# Patient Record
Sex: Female | Born: 1995
Health system: Southern US, Community
[De-identification: ages and names within clinical notes are randomized; demographics above are authoritative.]

## PROBLEM LIST (undated history)

## (undated) DIAGNOSIS — R109 Unspecified abdominal pain: Secondary | ICD-10-CM

## (undated) DIAGNOSIS — R112 Nausea with vomiting, unspecified: Secondary | ICD-10-CM

## (undated) HISTORY — PX: SHOULDER ARTHROSCOPY: SHX128

## (undated) HISTORY — PX: SHOULDER LATERJET: SHX6528

## (undated) HISTORY — PX: WISDOM TOOTH EXTRACTION: SHX21

---
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
## 2017-01-04 DIAGNOSIS — L040 Acute lymphadenitis of face, head and neck: Secondary | ICD-10-CM | POA: Diagnosis not present

## 2017-01-08 ENCOUNTER — Ambulatory Visit: Payer: BLUE CROSS/BLUE SHIELD | Attending: Orthopedic Surgery | Admitting: Physical Therapy

## 2017-01-08 ENCOUNTER — Encounter: Payer: Self-pay | Admitting: Physical Therapy

## 2017-01-08 DIAGNOSIS — M25512 Pain in left shoulder: Secondary | ICD-10-CM | POA: Diagnosis not present

## 2017-01-08 DIAGNOSIS — M25612 Stiffness of left shoulder, not elsewhere classified: Secondary | ICD-10-CM

## 2017-01-08 DIAGNOSIS — M6281 Muscle weakness (generalized): Secondary | ICD-10-CM | POA: Diagnosis not present

## 2017-01-08 NOTE — Therapy (Signed)
[place] [facility] [address] [place], [place], [address] Phone: [phone]   Fax:  [phone]  Physical Therapy Evaluation  Patient Details  Name: Sara Gomez MRN: [id number] Date of Birth: [birth year] Referring Provider: Dr. Roney Mans  Encounter Date: 01/08/2017      PT End of Session - 01/08/17 1015    Visit Number 1   Number of Visits 16   Date for PT Re-Evaluation 03/05/17   PT Start Time 0855   PT Stop Time 1001   PT Time Calculation (min) 66 min   Activity Tolerance Patient tolerated treatment well;No increased pain   Behavior During Therapy [facility] for tasks assessed/performed      History reviewed. No pertinent past medical history.  History reviewed. No pertinent surgical history.  There were no vitals filed for this visit.       Subjective Assessment - 01/08/17 0955    Subjective Pt. reports undergoing 2nd L shoulder surgery in a year on 11/28/16 (pt. will be at 6 weeks s/p tomorrow).  Pt. reports 1/10 L shoulder pain at rest and >5/10 at worst.  Pt. reports compliance with use of sling and not done any ROM in L shoulder at this time.  Pt. is hoping to get back to swimming/ running/ school work.     Limitations Lifting;Standing;Writing;House hold activities   Patient Stated Goals Increase L shoulder ROM/ strength to improve pain-free mobility.     Currently in Pain? Yes   Pain Score 1    Pain Location Shoulder   Pain Orientation Left   Pain Type Surgical pain   Pain Onset More than a month ago   Pain Frequency Intermittent            [facility] PT Assessment - 01/08/17 0001      Assessment   Medical Diagnosis s/p L shoulder labral repair   Referring Provider Dr. Roney Mans   Onset Date/Surgical Date 11/28/16   Hand Dominance Right   Prior Therapy yes at [facility] after initial surgery 1/17     Precautions   Precautions --  see MD protocol      See handouts for AA/PROM of L shoulder.  Issued shoulder  pulleys.         PT Education - 01/08/17 0935    Education provided Yes   Education Details See HEP/ issued pulley ex.    Person(s) Educated Patient   Methods Explanation;Demonstration;Handout   Comprehension Verbalized understanding;Returned demonstration;Tactile cues required             PT Long Term Goals - 01/08/17 1038      PT LONG TERM GOAL #1   Title Pt. I with HEP to increase L shoulder AROM to WNL as compared to R shoulder to improve pain-free mobility.     Baseline L shoulder flexion 107 deg., abd. 45 deg. and ER 0 deg. AAROM   Time 8   Period Weeks   Status New     PT LONG TERM GOAL #2   Title Pt. will increase L deltoid/ biceps/ triceps strength to grossly 4+/5 MMT to improve return to household tasks/ running.    Baseline No MMT at this time.  Will begin isometrics at 8 weeks.    Time 8   Period Weeks   Status New     PT LONG TERM GOAL #3   Title Pt. will decrease QuickDASH to <20% to improve pain-free moblity of L shoulder.     Baseline QuickDASH:  59.1% on 2/26   Time 8   Period Weeks   Status New     PT LONG TERM GOAL #4   Title Pt. able to return to gym based ex./ running with no L shoulder restrictions (Phase III) to improve return to PLOF.     Baseline No gym based ex. at this time.  Pt. not driving at this time.    Time 8   Period Weeks   Status New           Plan - 01/08/17 1017    Clinical Impression Statement Pt. is a pleasant 21 y/o female s/p L labral repair on 11/28/16.  Pt. reports that this is the 2nd L shoulder in a year due to a cheering injury and fall on stairs.  Pt. reports 1/10 L shoulder pain currently at rest in sling and >5/10 at worst over past week.  Decrease R shoulder PROM:  flexion (107 deg.), abd. (45 deg.)- per MD protocol, ER (neutral position)- pain limited.  No L shoulder tenderness and instructed pt. on scar massage.  R grip strength: 58.8# and L 37.7#.   QuickDASH: 59.1%.  Pt. instructed in progression of MD  protocol and importance of following ROM restrictions to improve L shoulder ROM/ stability to promote return to goals.     Rehab Potential Excellent   PT Frequency 2x / week   PT Duration 8 weeks   PT Treatment/Interventions ADLs/Self Care Home Management;Aquatic Therapy;Cryotherapy;Electrical Stimulation;Therapeutic exercise;Patient/family education;Neuromuscular re-education;Manual techniques;Scar mobilization;Passive range of motion   PT Next Visit Plan Progress L shoulder AA/PROM.  Start isometrics at 8 weeks s/p surgery (01/23/17).     PT Home Exercise Plan see handouts   Consulted and Agree with Plan of Care Patient      Patient will benefit from skilled therapeutic intervention in order to improve the following deficits and impairments:  Pain, Decreased mobility, Decreased scar mobility, Hypomobility, Decreased strength, Decreased range of motion, Decreased endurance, Decreased activity tolerance, Impaired flexibility  Visit Diagnosis: Acute pain of left shoulder  Shoulder joint stiffness, left  Muscle weakness (generalized)     Problem List There are no active problems to display for this patient.  Cammie McgeeMichael C Juell Radney, PT, DPT # [phone] 01/08/2017, 10:43 AM  [facility] [address] [place], [place], [address] Phone: [phone]   Fax:  [phone]  Name: Sara Gomez MRN: [id number] Date of Birth: 02/24/1996

## 2017-01-10 ENCOUNTER — Ambulatory Visit: Payer: BLUE CROSS/BLUE SHIELD | Admitting: Physical Therapy

## 2017-01-10 DIAGNOSIS — M25512 Pain in left shoulder: Secondary | ICD-10-CM | POA: Diagnosis not present

## 2017-01-10 DIAGNOSIS — M25612 Stiffness of left shoulder, not elsewhere classified: Secondary | ICD-10-CM

## 2017-01-10 DIAGNOSIS — M6281 Muscle weakness (generalized): Secondary | ICD-10-CM

## 2017-01-10 NOTE — Therapy (Signed)
[place] [facility] [address] [place], [place], [address] Phone: [phone]   Fax:  [phone]  Physical Therapy Treatment  Patient Details  Name: Sara GullyChloe A Mccammon MRN: [id number] Date of Birth: 02/15/1996 Referring Provider: Dr. Roney Mansreighton  Encounter Date: 01/10/2017      PT End of Session - 01/10/17 1137    Visit Number 2   Number of Visits 16   Date for PT Re-Evaluation 03/05/17   PT Start Time 0953   PT Stop Time 1040   PT Time Calculation (min) 47 min   Activity Tolerance Patient tolerated treatment well;No increased pain   Behavior [facility] Therapy [facility] for tasks assessed/performed      No past medical history on file.  No past surgical history on file.  There were no vitals filed for this visit.      Subjective Assessment - 01/10/17 1135    Subjective Pt. reports being really busy with errands/ shopping/ household cleaning yesterday resulting in L shoulder soreness.  Pt. wore sling for L shoulder support during tasks but reports increase discomfort today.  Pt. reports 2-3/10 L shoulder pain.  Pt. reports compliance with HEP.     Limitations Lifting;Standing;Writing;House hold activities   Patient Stated Goals Increase L shoulder ROM/ strength to improve pain-free mobility.     Currently in Pain? Yes   Pain Score 3    Pain Location Shoulder   Pain Orientation Left   Pain Descriptors / Indicators Aching;Sore   Pain Type Surgical pain   Pain Onset More than a month ago   Pain Frequency Intermittent       OBJECTIVE:  There.ex.: Reviewed HEP.  Seated pulley L shoulder AAROM flexion and abd. 10x2 each with cuing to increase static holds.  Supine wand B shoulder AAROM: press-ups/ flexion/ abd. 10x3 each. Manual tx.:  Seated L shoulder/UT STM during pulley ex.  Supine L shoulder AA/PROM 10x3 in flexion/ abd./ ER to MD required ROM.  Pt. Limited to 115 deg. L shoulder flexion/ 45 deg. Abd./ 0 deg. ER.  Discussed scar massage and  benefits of icing L shoulder on a consistent basis.      Pt response for medical necessity: benefits from L shoulder ROM ex. Progression to improve sh. Joint mobility and prepare of L UE isometrics.  No increase c/o pain but moderate joint stiffness/ hypomobility noted.         PT Long Term Goals - 01/08/17 1038      PT LONG TERM GOAL #1   Title Pt. I with HEP to increase L shoulder AROM to WNL as compared to R shoulder to improve pain-free mobility.     Baseline L shoulder flexion 107 deg., abd. 45 deg. and ER 0 deg. AAROM   Time 8   Period Weeks   Status New     PT LONG TERM GOAL #2   Title Pt. will increase L deltoid/ biceps/ triceps strength to grossly 4+/5 MMT to improve return to household tasks/ running.    Baseline No MMT at this time.  Will begin isometrics at 8 weeks.    Time 8   Period Weeks   Status New     PT LONG TERM GOAL #3   Title Pt. will decrease QuickDASH to <20% to improve pain-free moblity of L shoulder.     Baseline QuickDASH: 59.1% on 2/26   Time 8   Period Weeks   Status New     PT LONG TERM GOAL #  4   Title Pt. able to return to gym based ex./ running with no L shoulder restrictions (Phase III) to improve return to PLOF.     Baseline No gym based ex. at this time.  Pt. not driving at this time.    Time 8   Period Weeks   Status New             Plan - 01/10/17 1137    Clinical Impression Statement Pt. presents with L shoulder joint stiffness with flexion/ ER.  Pt. benefits from pulley ex./ warm-up STM to L UT and deltoid region to improve L shoulder AA/PROM in supine position.  PT discussed incision healing/ scar massage with [employer].  L UT muscle tightness/ minimal tenderness with palpation along muscle to shoulder region.  Pt. able to tolerate L shoulder ROM ex. within a pain tolerable range.     Rehab Potential Excellent   PT Frequency 2x / week   PT Duration 8 weeks   PT Treatment/Interventions ADLs/Self Care Home Management;Aquatic  Therapy;Cryotherapy;Electrical Stimulation;Therapeutic exercise;Patient/family education;Neuromuscular re-education;Manual techniques;Scar mobilization;Passive range of motion   PT Next Visit Plan Progress L shoulder AA/PROM.  Start isometrics next week (check MD protocol).     PT Home Exercise Plan see handouts   Consulted and Agree with Plan of Care Patient      Patient will benefit from skilled therapeutic intervention in order to improve the following deficits and impairments:  Pain, Decreased mobility, Decreased scar mobility, Hypomobility, Decreased strength, Decreased range of motion, Decreased endurance, Decreased activity tolerance, Impaired flexibility  Visit Diagnosis: Acute pain of left shoulder  Shoulder joint stiffness, left  Muscle weakness (generalized)     Problem List There are no active problems to display for this patient.  Sara Gomez, PT, DPT # [phone] 01/10/2017, 11:43 AM  [facility] [address] [place], [place], [address] Phone: [phone]   Fax:  [phone]  Name: Sara Gomez MRN: [id number] Date of Birth: 03-30-1996

## 2017-01-15 ENCOUNTER — Ambulatory Visit: Payer: BLUE CROSS/BLUE SHIELD | Attending: Orthopedic Surgery | Admitting: Physical Therapy

## 2017-01-15 DIAGNOSIS — M25612 Stiffness of left shoulder, not elsewhere classified: Secondary | ICD-10-CM | POA: Diagnosis not present

## 2017-01-15 DIAGNOSIS — M6281 Muscle weakness (generalized): Secondary | ICD-10-CM | POA: Diagnosis not present

## 2017-01-15 DIAGNOSIS — M25512 Pain in left shoulder: Secondary | ICD-10-CM | POA: Diagnosis not present

## 2017-01-15 NOTE — Therapy (Signed)
[place] [facility] [address] [place], [place], [address] Phone: [phone]   Fax:  [phone]  Physical Therapy Treatment  Patient Details  Name: Raizy Auzenne Pepper MRN: [id number] Date of Birth: 06-08-1996 Referring Provider: Dr. Roney Mans  Encounter Date: 01/15/2017      PT End of Session - 01/15/17 0904    Visit Number 3   Number of Visits 16   Date for PT Re-Evaluation 03/05/17   PT Start Time 0857   PT Stop Time 0946   PT Time Calculation (min) 49 min   Activity Tolerance Patient tolerated treatment well;No increased pain   Behavior During Therapy [facility] for tasks assessed/performed      No past medical history on file.  No past surgical history on file.  There were no vitals filed for this visit.      Subjective Assessment - 01/15/17 0900    Subjective Patient reports nerve pain yesterday 9/10 that has decreased to 4/10 pain. Pt. iced and took aleve to relieve pain. Pt. reports compliance with HEP with exception of yesterday.    Limitations Lifting;Standing;Writing;House hold activities   Patient Stated Goals Increase L shoulder ROM/ strength to improve pain-free mobility.     Currently in Pain? Yes   Pain Score 4    Pain Location Shoulder   Pain Orientation Left   Pain Descriptors / Indicators Aching;Sore   Pain Onset More than a month ago     There.ex.:  Seated pulley L shoulder AAROM flexion and abd. 15x2 each with cuing to increase static holds.  Supine wand B shoulder AAROM: press-ups/ flexion/ abd, Er, 12x3 each.   Manual tx.: overpressure in supine of L shoulder AA/PROM 10x3 in flexion/ abd./ ER to MD required ROM.  Pt. Limited to 130 deg. L shoulder flexion/ 45 deg. Abd./ 6 deg. Holding end position for ~8-10 seconds. ER.Discussed icing         Pt response for medical necessity: benefits from L shoulder ROM ex. Progression to improve sh. Joint mobility and prepare of L UE isometrics.  No increase c/o pain but  moderate joint stiffness/ hypomobility noted.          PT Long Term Goals - 01/08/17 1038      PT LONG TERM GOAL #1   Title Pt. I with HEP to increase L shoulder AROM to WNL as compared to R shoulder to improve pain-free mobility.     Baseline L shoulder flexion 107 deg., abd. 45 deg. and ER 0 deg. AAROM   Time 8   Period Weeks   Status New     PT LONG TERM GOAL #2   Title Pt. will increase L deltoid/ biceps/ triceps strength to grossly 4+/5 MMT to improve return to household tasks/ running.    Baseline No MMT at this time.  Will begin isometrics at 8 weeks.    Time 8   Period Weeks   Status New     PT LONG TERM GOAL #3   Title Pt. will decrease QuickDASH to <20% to improve pain-free moblity of L shoulder.     Baseline QuickDASH: 59.1% on 2/26   Time 8   Period Weeks   Status New     PT LONG TERM GOAL #4   Title Pt. able to return to gym based ex./ running with no L shoulder restrictions (Phase III) to improve return to PLOF.     Baseline No gym based ex. at this time.  Pt.  not driving at this time.    Time 8   Period Weeks   Status New            Plan - 01/15/17 1521    Clinical Impression Statement Pt presents to therapy with L shoulder stiffness. Pt. experienced pain the day prior in shoulder that has decreased today. ROM is limited with flexion PROM 130 and ER: 6 degrees. Pt. benefits from pulley exercise and AAROM prior to PROM to allow for improved ROM. ER continues to be limited and painful at end range. Patient will benefit from continued skilled physical therapy to improve shoulder ROM, strength, and functional activity tolerance to return to prior level of function.    Rehab Potential Excellent   PT Frequency 2x / week   PT Duration 8 weeks   PT Treatment/Interventions ADLs/Self Care Home Management;Aquatic Therapy;Cryotherapy;Electrical Stimulation;Therapeutic exercise;Patient/family education;Neuromuscular re-education;Manual techniques;Scar  mobilization;Passive range of motion   PT Next Visit Plan Progress L shoulder AA/PROM.  Start isometrics next week (check MD protocol).     PT Home Exercise Plan see handouts   Consulted and Agree with Plan of Care Patient      Patient will benefit from skilled therapeutic intervention in order to improve the following deficits and impairments:  Pain, Decreased mobility, Decreased scar mobility, Hypomobility, Decreased strength, Decreased range of motion, Decreased endurance, Decreased activity tolerance, Impaired flexibility  Visit Diagnosis: Acute pain of left shoulder  Shoulder joint stiffness, left  Muscle weakness (generalized)     Problem List There are no active problems to display for this patient.  Cammie McgeeMichael C Sherk, PT, DPT # [id number] Precious BardMarina Dorcus Riga, SPT 01/15/2017, 3:25 PM  [facility] [address] [place], [place], [address] Phone: [phone]   Fax:  [phone]  Name: Ihor GullyChloe A Allnutt MRN: [id number] Date of Birth: 10/17/1996

## 2017-01-17 ENCOUNTER — Ambulatory Visit: Payer: BLUE CROSS/BLUE SHIELD | Admitting: Physical Therapy

## 2017-01-17 DIAGNOSIS — M25512 Pain in left shoulder: Secondary | ICD-10-CM | POA: Diagnosis not present

## 2017-01-17 DIAGNOSIS — M6281 Muscle weakness (generalized): Secondary | ICD-10-CM

## 2017-01-17 DIAGNOSIS — M25612 Stiffness of left shoulder, not elsewhere classified: Secondary | ICD-10-CM

## 2017-01-17 NOTE — Therapy (Signed)
[place] [facility] [address] [place], [place], [address] Phone: [phone]   Fax:  [phone]  Physical Therapy Treatment  Patient Details  Name: Sara Gomez MRN: [id number] Date of Birth: 1995/11/25 Referring Provider: Dr. Roney Mans  Encounter Date: 01/17/2017      PT End of Session - 01/17/17 1013    Visit Number 4   Number of Visits 16   Date for PT Re-Evaluation 03/05/17   PT Start Time 0850   PT Stop Time 0942   PT Time Calculation (min) 52 min   Activity Tolerance Patient tolerated treatment well   Behavior During Therapy [facility] for tasks assessed/performed      No past medical history on file.  No past surgical history on file.  There were no vitals filed for this visit.      Subjective Assessment - 01/17/17 1011    Subjective Patient occasionally has the pain described sunday when performing ER exercises at home. Patient had a job interview.    Limitations Lifting;Standing;Writing;House hold activities   Patient Stated Goals Increase L shoulder ROM/ strength to improve pain-free mobility.     Currently in Pain? Yes   Pain Score 2    Pain Location Shoulder   Pain Orientation Left   Pain Descriptors / Indicators Aching;Sore   Pain Type Surgical pain   Pain Onset More than a month ago       There.ex.:  Seated pulley L shoulder AAROM flexion and abd. 15x2 each with cuing to increase static holds. Supine wand B shoulder AAROM: press-ups/ flexion/ abd, Er, 10-12x3 each with 3 second hold at end range. Finger walks flexion and abduction 3x each position. Prescribed with HEP.    Manual tx.: overpressure in supine of L shoulder AA/PROM 10x3 in flexion/ abd./ ER to MD required ROM. Pt. Limited to 141 deg. L shoulder flexion/ 45 deg. Abd./ 6 deg. Holding end position for ~8-10 seconds. ER.Supine L upper trap STM and occipital release 2x 30 seconds.    Pt response for medical necessity: benefits from L shoulder  ROM ex. Progression to improve sh. Joint mobility and prepare of L UE isometrics. Limited ER         PT Education - 01/17/17 1012    Education provided Yes   Education Details wall finger crawl   Person(s) Educated Patient   Methods Explanation;Demonstration;Handout   Comprehension Verbalized understanding;Returned demonstration             PT Long Term Goals - 01/08/17 1038      PT LONG TERM GOAL #1   Title Pt. I with HEP to increase L shoulder AROM to WNL as compared to R shoulder to improve pain-free mobility.     Baseline L shoulder flexion 107 deg., abd. 45 deg. and ER 0 deg. AAROM   Time 8   Period Weeks   Status New     PT LONG TERM GOAL #2   Title Pt. will increase L deltoid/ biceps/ triceps strength to grossly 4+/5 MMT to improve return to household tasks/ running.    Baseline No MMT at this time.  Will begin isometrics at 8 weeks.    Time 8   Period Weeks   Status New     PT LONG TERM GOAL #3   Title Pt. will decrease QuickDASH to <20% to improve pain-free moblity of L shoulder.     Baseline QuickDASH: 59.1% on 2/26   Time 8   Period Weeks  Status New     PT LONG TERM GOAL #4   Title Pt. able to return to gym based ex./ running with no L shoulder restrictions (Phase III) to improve return to PLOF.     Baseline No gym based ex. at this time.  Pt. not driving at this time.    Time 8   Period Weeks   Status New             Plan - 01/17/17 1017    Clinical Impression Statement Patient presents to physical therapy with increased flexion. L Shoulder flexion PROM after AAROM and pulleys was 141 degrees, ER continues to be limited at 6 degrees, with abduction at required range for protocol. Pt. was educated on and performed finger walks and given to perform at home as HEP. Pulley and AAROM prior to PROM and finger walks allows for greater ROM to be reached. L UT muscle tightness noted and received STM intermittently to relieve.  Patient will benefit from  continued skilled physical therapy to improve shoulder ROM, strength, and functional activity tolerance to return to prior level of function.    Rehab Potential Excellent   PT Frequency 2x / week   PT Duration 8 weeks   PT Treatment/Interventions ADLs/Self Care Home Management;Aquatic Therapy;Cryotherapy;Electrical Stimulation;Therapeutic exercise;Patient/family education;Neuromuscular re-education;Manual techniques;Scar mobilization;Passive range of motion   PT Next Visit Plan finger walks, isometrics?    PT Home Exercise Plan see handouts   Consulted and Agree with Plan of Care Patient      Patient will benefit from skilled therapeutic intervention in order to improve the following deficits and impairments:  Pain, Decreased mobility, Decreased scar mobility, Hypomobility, Decreased strength, Decreased range of motion, Decreased endurance, Decreased activity tolerance, Impaired flexibility  Visit Diagnosis: Acute pain of left shoulder  Shoulder joint stiffness, left  Muscle weakness (generalized)     Problem List There are no active problems to display for this patient.  Cammie McgeeMichael C Sherk, PT, DPT # [id number] Precious BardMarina Alahna Dunne, SPT 01/17/2017, 1:29 PM  [place] [facility] [address] [place], [place], [address] Phone: [phone]   Fax:  [phone]  Name: Sara Gomez MRN: [id number] Date of Birth: 07/05/1996

## 2017-01-22 ENCOUNTER — Ambulatory Visit: Payer: BLUE CROSS/BLUE SHIELD | Admitting: Physical Therapy

## 2017-01-22 DIAGNOSIS — M6281 Muscle weakness (generalized): Secondary | ICD-10-CM

## 2017-01-22 DIAGNOSIS — M25612 Stiffness of left shoulder, not elsewhere classified: Secondary | ICD-10-CM

## 2017-01-22 DIAGNOSIS — M25512 Pain in left shoulder: Secondary | ICD-10-CM | POA: Diagnosis not present

## 2017-01-23 NOTE — Therapy (Signed)
[place] [facility] [address] [place], [place], [address] Phone: [phone]   Fax:  [phone]  Physical Therapy Treatment  Patient Details  Name: Sara Gomez MRN: [id number] Date of Birth: November 16, 1995 Referring Provider: Dr. Roney Mans  Encounter Date: 01/22/2017      PT End of Session - 01/23/17 1459    Visit Number 5   Number of Visits 16   Date for PT Re-Evaluation 03/05/17   PT Start Time 0936   PT Stop Time 1028   PT Time Calculation (min) 52 min   Activity Tolerance Patient tolerated treatment well   Behavior During Therapy [facility] for tasks assessed/performed      No past medical history on file.  No past surgical history on file.  There were no vitals filed for this visit.      Subjective Assessment - 01/22/17 0937    Subjective Pt. reports 2/10 L shoulder pain/tightness.  Pt. started working at [employer].  Limited activities at work due to L shoulder.     Limitations Lifting;Standing;Writing;House hold activities   Patient Stated Goals Increase L shoulder ROM/ strength to improve pain-free mobility.     Currently in Pain? Yes   Pain Score 2    Pain Location Shoulder   Pain Orientation Left        There.ex.: Seated B UBE AAROM 1 min. X 4 (warm-up/ no charge/ consistent cadence). Supine wand B shoulder AAROM: press-ups/ flexion/ abd, Er, 10x3 each with 3 second hold at end range.  Supine 3# wt. Wand press-ups 20x with serratus punches.  Supine L shoulder isometric 5x 10 sec. (flexion/ ext./ ER/ IR)- pain tolerable with cuing to prevent any increase c/o pain.  Supine L shoulder at 90 deg. Flexion rhythmic stabs 3x 30 sec.  Will issue isometric HEP next tx. session.   Manual tx.: supineofL shoulder AA/PROM 10x3 in flexion/ abd./ ER to MD required ROM. Limited L shoulder ROM due to pain/ muscle guarding, esp. With ER.  Contract-relax with L shoulder ER 5x (pain tolerable).   Supine L upper trap STM and  occipital release 2x 30 seconds.   Pt response for medical necessity: benefits from L shoulder ROM ex. Progression to improve sh. Joint mobility and prepare of L UE isometrics. Limited ER        PT Long Term Goals - 01/08/17 1038      PT LONG TERM GOAL #1   Title Pt. I with HEP to increase L shoulder AROM to WNL as compared to R shoulder to improve pain-free mobility.     Baseline L shoulder flexion 107 deg., abd. 45 deg. and ER 0 deg. AAROM   Time 8   Period Weeks   Status New     PT LONG TERM GOAL #2   Title Pt. will increase L deltoid/ biceps/ triceps strength to grossly 4+/5 MMT to improve return to household tasks/ running.    Baseline No MMT at this time.  Will begin isometrics at 8 weeks.    Time 8   Period Weeks   Status New     PT LONG TERM GOAL #3   Title Pt. will decrease QuickDASH to <20% to improve pain-free moblity of L shoulder.     Baseline QuickDASH: 59.1% on 2/26   Time 8   Period Weeks   Status New     PT LONG TERM GOAL #4   Title Pt. able to return to gym based ex./ running with  no L shoulder restrictions (Phase III) to improve return to PLOF.     Baseline No gym based ex. at this time.  Pt. not driving at this time.    Time 8   Period Weeks   Status New            Plan - 01/23/17 1500    Clinical Impression Statement Pt. remains pain limited/ hypomobile in L shoulder joint with AA/PROM in supine position.  Pt. has moderate muscle guarding with L shoulder ROM ex., esp. with L shoulder flexion/ ER.   Pt. has remained compliant with MD protocol and started isometrics with no increase c/o pain in supine position.  Pt. will benefit from focus on L shoulder ROM and isometrics to promote return to daily activities without limitation.     Rehab Potential Excellent   PT Frequency 2x / week   PT Duration 8 weeks   PT Treatment/Interventions ADLs/Self Care Home Management;Aquatic Therapy;Cryotherapy;Electrical Stimulation;Therapeutic  exercise;Patient/family education;Neuromuscular re-education;Manual techniques;Scar mobilization;Passive range of motion   PT Next Visit Plan Issue an isometric HEP next tx.    PT Home Exercise Plan see handouts   Consulted and Agree with Plan of Care Patient      Patient will benefit from skilled therapeutic intervention in order to improve the following deficits and impairments:  Pain, Decreased mobility, Decreased scar mobility, Hypomobility, Decreased strength, Decreased range of motion, Decreased endurance, Decreased activity tolerance, Impaired flexibility  Visit Diagnosis: Acute pain of left shoulder  Shoulder joint stiffness, left  Muscle weakness (generalized)     Problem List There are no active problems to display for this patient.  Cammie McgeeMichael C Nickie Deren, PT, DPT # [id number] 01/23/2017, 3:04 PM  [place] [facility] [address] [place], [place], [address] Phone: [phone]   Fax:  [phone]  Name: Ihor GullyChloe A Diaz MRN: [id number] Date of Birth: 01/25/1996

## 2017-01-24 ENCOUNTER — Ambulatory Visit: Payer: BLUE CROSS/BLUE SHIELD | Admitting: Physical Therapy

## 2017-01-24 ENCOUNTER — Encounter: Payer: Self-pay | Admitting: Physical Therapy

## 2017-01-24 DIAGNOSIS — M25512 Pain in left shoulder: Secondary | ICD-10-CM | POA: Diagnosis not present

## 2017-01-24 DIAGNOSIS — M6281 Muscle weakness (generalized): Secondary | ICD-10-CM

## 2017-01-24 DIAGNOSIS — M25612 Stiffness of left shoulder, not elsewhere classified: Secondary | ICD-10-CM

## 2017-01-24 NOTE — Therapy (Signed)
[facility] [address] [place], [place], [address] Phone: [phone]   Fax:  [phone]  Physical Therapy Treatment  Patient Details  Name: Sara GullyChloe A Gomez MRN: [id number] Date of Birth: 03/14/1996 Referring Provider: Dr. Roney Mansreighton  Encounter Date: 01/24/2017      PT End of Session - 01/24/17 1737    Visit Number 6   Number of Visits 16   Date for PT Re-Evaluation 03/05/17   PT Start Time 0857   PT Stop Time 0948   PT Time Calculation (min) 51 min   Activity Tolerance Patient tolerated treatment well   Behavior During Therapy [facility] for tasks assessed/performed      History reviewed. No pertinent past medical history.  History reviewed. No pertinent surgical history.  There were no vitals filed for this visit.      Subjective Assessment - 01/24/17 1736    Subjective Patient reports increased pain and soreness in L shoulder and has not been compliant with HEPs.    Limitations Lifting;Standing;Writing;House hold activities   Patient Stated Goals Increase L shoulder ROM/ strength to improve pain-free mobility.     Currently in Pain? Yes   Pain Score 4    Pain Location Shoulder   Pain Orientation Left   Pain Descriptors / Indicators Aching;Sore      TherEx Standing AAROM with wand: press ups/flexion/abd/ER 10x3 each with 3 second holds. Supine L shoulder isometrics 5x10 seconds of flexion, ext, ER , IR , abd. Standing L shoulder isometrics with wall 5x10 seconds and with desk 2x10 seconds flex, ext, ER , IR , abd.  See HEP.    Manual SupineofL shoulder AA/PROM 10x3 in flexion/ abd./ ER to MD required ROM. Limited L shoulder ROM due to pain/ muscle guarding, esp. With ER. Rhythmic rotation 5x 30 seconds, Supine L upper trap STM and occipital release 2x 30 seconds.   Pt response for medical necessity: benefits from L shoulder ROM ex. Progression to improve sh. Joint mobility and prepare of L UE isometrics.  Limited ER          PT Education - 01/24/17 1743    Education provided Yes   Education Details new HEP   Person(s) Educated Patient   Methods Explanation;Demonstration;Handout   Comprehension Verbalized understanding;Returned demonstration             PT Long Term Goals - 01/08/17 1038      PT LONG TERM GOAL #1   Title Pt. I with HEP to increase L shoulder AROM to WNL as compared to R shoulder to improve pain-free mobility.     Baseline L shoulder flexion 107 deg., abd. 45 deg. and ER 0 deg. AAROM   Time 8   Period Weeks   Status New     PT LONG TERM GOAL #2   Title Pt. will increase L deltoid/ biceps/ triceps strength to grossly 4+/5 MMT to improve return to household tasks/ running.    Baseline No MMT at this time.  Will begin isometrics at 8 weeks.    Time 8   Period Weeks   Status New     PT LONG TERM GOAL #3   Title Pt. will decrease QuickDASH to <20% to improve pain-free moblity of L shoulder.     Baseline QuickDASH: 59.1% on 2/26   Time 8   Period Weeks   Status New     PT LONG TERM GOAL #4   Title Pt. able to return to gym  based ex./ running with no L shoulder restrictions (Phase III) to improve return to PLOF.     Baseline No gym based ex. at this time.  Pt. not driving at this time.    Time 8   Period Weeks   Status New            Plan - 01/24/17 1740    Clinical Impression Statement Patient presents to physical therapy session with increased pain and noncompliance with HEP. L shoulder remains hypomobile with AA/PROM in supine with limited ER and flexion.  Patient requires frequent rhythmic rotation to decrease muscle guarding to allow increased ROM. Isometrics were given for new HEP and reviewed utilizing wall and desk.  PT discussed importance of getting out of sling (even at work) and return to driving.  Patient will benefit from continued skilled physical therapy to return to daily activities without limitation.    Rehab Potential Excellent    PT Frequency 2x / week   PT Duration 8 weeks   PT Treatment/Interventions ADLs/Self Care Home Management;Aquatic Therapy;Cryotherapy;Electrical Stimulation;Therapeutic exercise;Patient/family education;Neuromuscular re-education;Manual techniques;Scar mobilization;Passive range of motion   PT Next Visit Plan review isometrics   PT Home Exercise Plan see handouts   Consulted and Agree with Plan of Care Patient      Patient will benefit from skilled therapeutic intervention in order to improve the following deficits and impairments:  Pain, Decreased mobility, Decreased scar mobility, Hypomobility, Decreased strength, Decreased range of motion, Decreased endurance, Decreased activity tolerance, Impaired flexibility  Visit Diagnosis: Acute pain of left shoulder  Shoulder joint stiffness, left  Muscle weakness (generalized)     Problem List There are no active problems to display for this patient.  Sara Gomez, PT, DPT # [id number] Sara Gomez, SPT 01/24/2017, 5:52 PM  [place] [facility] [address] [place], [place], [address] Phone: [phone]   Fax:  [phone]  Name: Sara Gomez MRN: [id number] Date of Birth: [birth year]

## 2017-01-29 ENCOUNTER — Ambulatory Visit: Payer: BLUE CROSS/BLUE SHIELD | Admitting: Physical Therapy

## 2017-01-29 DIAGNOSIS — M25612 Stiffness of left shoulder, not elsewhere classified: Secondary | ICD-10-CM

## 2017-01-29 DIAGNOSIS — M25512 Pain in left shoulder: Secondary | ICD-10-CM

## 2017-01-29 DIAGNOSIS — M6281 Muscle weakness (generalized): Secondary | ICD-10-CM

## 2017-01-29 NOTE — Therapy (Signed)
[facility] [address] [place], [place], [address] Phone: [phone]   Fax:  [phone]  Physical Therapy Treatment  Patient Details  Name: Sara Gomez MRN: [id number] Date of Birth: [birth year] Referring Provider: Dr. Roney Mans  Encounter Date: 01/29/2017      PT End of Session - 01/29/17 1459    Visit Number 7   Number of Visits 16   Date for PT Re-Evaluation 03/05/17   PT Start Time 0856   PT Stop Time 0947   PT Time Calculation (min) 51 min   Activity Tolerance Patient tolerated treatment well   Behavior During Therapy [facility] for tasks assessed/performed      No past medical history on file.  No past surgical history on file.  There were no vitals filed for this visit.      Subjective Assessment - 01/29/17 1456    Subjective Patient reports being compliant with HEP and performing isometrics while at work. She continues to be pain focused and requested Estim.    Limitations Lifting;Standing;Writing;House hold activities   Patient Stated Goals Increase L shoulder ROM/ strength to improve pain-free mobility.     Currently in Pain? Yes   Pain Score 3    Pain Location Shoulder   Pain Orientation Left   Pain Descriptors / Indicators Aching      TherEx Standing AAROM with wand: press ups/flexion/abd/ER 10x3 each with 3 second holds. Supine L shoulder isometrics 5x10 seconds of flexion, ext, ER , IR , abd, add in different planes of motion. Seated neck stretch 2x30 seconds  Manual SupineofL shoulder AA/PROM 10x3 in flexion/ abd./ ER to MD required ROM. Limited L shoulder ROM due to pain/ muscle guarding, esp. With ER. Rhythmic rotation 5x 30 seconds, Supine L upper trap STM and occipital release 3x 30 seconds.   IFC X-pattern to L shoulder with portable EMPI TENS unit for pain mgmt. During manual tx. Session.  Preset shoulder protocol t/o tx. Session (pt. Reports decrease pain during manual tx./ supine there.ex.).      Pt response for medical necessity: benefits from L shoulder ROM, isometrics, and strength to return to functional activities.       PT Education - 01/29/17 1458    Education provided Yes   Education Details e stim   Person(s) Educated Patient   Methods Explanation;Demonstration   Comprehension Verbalized understanding             PT Long Term Goals - 01/08/17 1038      PT LONG TERM GOAL #1   Title Pt. I with HEP to increase L shoulder AROM to WNL as compared to R shoulder to improve pain-free mobility.     Baseline L shoulder flexion 107 deg., abd. 45 deg. and ER 0 deg. AAROM   Time 8   Period Weeks   Status New     PT LONG TERM GOAL #2   Title Pt. will increase L deltoid/ biceps/ triceps strength to grossly 4+/5 MMT to improve return to household tasks/ running.    Baseline No MMT at this time.  Will begin isometrics at 8 weeks.    Time 8   Period Weeks   Status New     PT LONG TERM GOAL #3   Title Pt. will decrease QuickDASH to <20% to improve pain-free moblity of L shoulder.     Baseline QuickDASH: 59.1% on 2/26   Time 8   Period Weeks   Status New  PT LONG TERM GOAL #4   Title Pt. able to return to gym based ex./ running with no L shoulder restrictions (Phase III) to improve return to PLOF.     Baseline No gym based ex. at this time.  Pt. not driving at this time.    Time 8   Period Weeks   Status New            Plan - 01/29/17 1503    Clinical Impression Statement Patient presents to physical therapy session with pain centric mindset. Hypomobility in L shoulder joint present with AA/PROM with improved ER (20 degrees) and flexion range with PROM noted and pain at end range.  Isometrics continue to challenge pt.'s strength with ER being most pain provoking. Portable E-stim was provided to pt. throughout session. Pt. will continue to benefit from skilled physical therapy to promote return to daily activities without limitation.    Rehab Potential  Excellent   PT Frequency 2x / week   PT Duration 8 weeks   PT Treatment/Interventions ADLs/Self Care Home Management;Aquatic Therapy;Cryotherapy;Electrical Stimulation;Therapeutic exercise;Patient/family education;Neuromuscular re-education;Manual techniques;Scar mobilization;Passive range of motion   PT Next Visit Plan isometrics   PT Home Exercise Plan see handouts   Consulted and Agree with Plan of Care Patient      Patient will benefit from skilled therapeutic intervention in order to improve the following deficits and impairments:  Pain, Decreased mobility, Decreased scar mobility, Hypomobility, Decreased strength, Decreased range of motion, Decreased endurance, Decreased activity tolerance, Impaired flexibility  Visit Diagnosis: Acute pain of left shoulder  Shoulder joint stiffness, left  Muscle weakness (generalized)     Problem List There are no active problems to display for this patient.  Cammie McgeeMichael C Sherk, PT, DPT # [id number] Precious BardMarina Tyyonna Soucy, SPT 01/30/2017, 7:13 AM  [place] [facility] [address] [place], [place], [address] Phone: [phone]   Fax:  [phone]  Name: Sara GullyChloe A Gomez MRN: [id number] Date of Birth: 08/28/1996

## 2017-01-30 DIAGNOSIS — B088 Other specified viral infections characterized by skin and mucous membrane lesions: Secondary | ICD-10-CM | POA: Diagnosis not present

## 2017-01-31 ENCOUNTER — Ambulatory Visit: Payer: BLUE CROSS/BLUE SHIELD | Admitting: Physical Therapy

## 2017-01-31 DIAGNOSIS — M25612 Stiffness of left shoulder, not elsewhere classified: Secondary | ICD-10-CM

## 2017-01-31 DIAGNOSIS — M25512 Pain in left shoulder: Secondary | ICD-10-CM | POA: Diagnosis not present

## 2017-01-31 DIAGNOSIS — M6281 Muscle weakness (generalized): Secondary | ICD-10-CM

## 2017-01-31 NOTE — Therapy (Signed)
[place] [facility] [address] [place], [place], [address] Phone: [phone]   Fax:  [phone]  Physical Therapy Treatment  Patient Details  Name: Sara Gomez MRN: [id number] Date of Birth: 11/03/1996 Referring Provider: Dr. Roney Mans  Encounter Date: 01/31/2017      PT End of Session - 01/31/17 1243    Visit Number 8   Number of Visits 16   Date for PT Re-Evaluation 03/05/17   PT Start Time 0850   PT Stop Time 0941   PT Time Calculation (min) 51 min   Activity Tolerance Patient tolerated treatment well;Patient limited by pain   Behavior During Therapy [facility] for tasks assessed/performed      No past medical history on file.  No past surgical history on file.  There were no vitals filed for this visit.      Subjective Assessment - 01/31/17 0903    Subjective Patient has been doing HEP and has been doing well with just a little discomfort.    Limitations Lifting;Standing;Writing;House hold activities   Patient Stated Goals Increase L shoulder ROM/ strength to improve pain-free mobility.     Pain Score 3    Pain Location Shoulder      TherEx Standing AAROM with wand: press ups/flexion/abd/ER 10x3 each with 3 second holds. Supine L shoulder isometrics 5x10 seconds of flexion, ext, ER , IR , abd, add in different planes of motion. UBE 4 minutes  Manual SupineofL shoulder AA/PROM 10x3 in flexion/ abd./ ER to MD required ROM. Limited L shoulder ROM due to pain/ muscle guarding, esp. With ER. Rhythmic rotation 5x 30 seconds, Supine L upper trap STM and occipital release 3x 30 seconds  Educated on home Estim.    Pt response for medical necessity: benefits from L shoulder ROM, isometrics, and strength to return to functional activities.          PT Long Term Goals - 01/08/17 1038      PT LONG TERM GOAL #1   Title Pt. I with HEP to increase L shoulder AROM to WNL as compared to R shoulder to improve pain-free  mobility.     Baseline L shoulder flexion 107 deg., abd. 45 deg. and ER 0 deg. AAROM   Time 8   Period Weeks   Status New     PT LONG TERM GOAL #2   Title Pt. will increase L deltoid/ biceps/ triceps strength to grossly 4+/5 MMT to improve return to household tasks/ running.    Baseline No MMT at this time.  Will begin isometrics at 8 weeks.    Time 8   Period Weeks   Status New     PT LONG TERM GOAL #3   Title Pt. will decrease QuickDASH to <20% to improve pain-free moblity of L shoulder.     Baseline QuickDASH: 59.1% on 2/26   Time 8   Period Weeks   Status New     PT LONG TERM GOAL #4   Title Pt. able to return to gym based ex./ running with no L shoulder restrictions (Phase III) to improve return to PLOF.     Baseline No gym based ex. at this time.  Pt. not driving at this time.    Time 8   Period Weeks   Status New             Plan - 01/31/17 4259    Clinical Impression Statement Patient presents to physical therapy with continued isometric strengthening  activity. Standing AAROM challenges patient due to fear avoidance of end range/pain. Isometrics challenge patient in supine with different planes altering level of difficulty. External rotation continues to be the most limited motion with pain at end range and avoidance of motion. Patient will continue to benefit from skilled physical therapy to increase L shoulder ROM, strength, and functional activity to return to activities of daily living and exercise such as sports and dance.    Rehab Potential Excellent   PT Frequency 2x / week   PT Duration 8 weeks   PT Treatment/Interventions ADLs/Self Care Home Management;Aquatic Therapy;Cryotherapy;Electrical Stimulation;Therapeutic exercise;Patient/family education;Neuromuscular re-education;Manual techniques;Scar mobilization;Passive range of motion   PT Next Visit Plan RECERT- take measurements for doc    PT Home Exercise Plan see handouts   Consulted and Agree with Plan of  Care Patient      Patient will benefit from skilled therapeutic intervention in order to improve the following deficits and impairments:  Pain, Decreased mobility, Decreased scar mobility, Hypomobility, Decreased strength, Decreased range of motion, Decreased endurance, Decreased activity tolerance, Impaired flexibility  Visit Diagnosis: Acute pain of left shoulder  Shoulder joint stiffness, left  Muscle weakness (generalized)     Problem List There are no active problems to display for this patient.  Sara Gomez, PT, DPT # [id number] Precious BardMarina Lilyanah Celestin, SPT 01/31/2017, 2:56 PM  [place] [facility] [address] [place], [place], [address] Phone: [phone]   Fax:  [phone]  Name: Sara Gomez MRN: [id number] Date of Birth: 12/08/1995

## 2017-02-05 ENCOUNTER — Encounter: Payer: Self-pay | Admitting: Physical Therapy

## 2017-02-05 ENCOUNTER — Ambulatory Visit: Payer: BLUE CROSS/BLUE SHIELD | Admitting: Physical Therapy

## 2017-02-05 DIAGNOSIS — M25612 Stiffness of left shoulder, not elsewhere classified: Secondary | ICD-10-CM

## 2017-02-05 DIAGNOSIS — M6281 Muscle weakness (generalized): Secondary | ICD-10-CM

## 2017-02-05 DIAGNOSIS — M25512 Pain in left shoulder: Secondary | ICD-10-CM

## 2017-02-06 NOTE — Therapy (Signed)
[facility] [address] [place], [place], [address] Phone: [phone]   Fax:  [phone]  Physical Therapy Treatment  Patient Details  Name: Sara Gomez MRN: [id number] Date of Birth: Oct 22, 1996 Referring Provider: Dr. Jeannie Fend  Encounter Date: 02/05/2017      PT End of Session - 02/06/17 1708    Visit Number 9   Number of Visits 16   Date for PT Re-Evaluation 03/05/17   PT Start Time 0851   PT Stop Time 0950   PT Time Calculation (min) 59 min   Activity Tolerance Patient tolerated treatment well;Patient limited by pain   Behavior During Therapy [facility] for tasks assessed/performed      History reviewed. No pertinent past medical history.  History reviewed. No pertinent surgical history.  There were no vitals filed for this visit.      Subjective Assessment - 02/06/17 1646    Subjective Pt. states she continues to have persistent L mid-deltoid pain (over supraspinatus muscle) during daily tasks.  Pt. reports compliance with HEP/ isometrics at work.  Pt. has not returned to driving at this time until she gets clearance from MD after f/u visit this Wednesday.     Limitations Lifting;Standing;Writing;House hold activities   Patient Stated Goals Increase L shoulder ROM/ strength to improve pain-free mobility.     Currently in Pain? Yes   Pain Score 3    Pain Location Shoulder   Pain Orientation Left   Pain Descriptors / Indicators Aching   Pain Type Surgical pain   Pain Onset More than a month ago       TherEx Standing A/AROM with wand: press ups/flexion/abd/ER 10x3 each with 3 second holds. Supine L shoulder isometrics 5x10 seconds of flexion, ext, ER , IR , abd, add in different planes of motion.  Seated Nautilus: lat. Pull downs 20#/ tricep ext. 20#/ sh. Adduction 20# 20x each.  Standing B bicep curls with wt. Bar 30x (mirror feedback).  Reviewed HEP and discussed trial TENS for home use.    Manual SupineofL  shoulder AA/PROM 10x3 in flexion/ abd./ ER to MD required ROM. Limited L shoulder ROM due to pain/ muscle guarding, esp. With ER. Rhythmic rotation 5x 30 seconds, Supine L upper trap STM and occipital release 3x 30 seconds    Pt response for medical necessity: benefits from L shoulder ROM, isometrics, and strength to return to functional activities.  Discuss MD f/u next visit and progress as tolerated.         PT Long Term Goals - 02/06/17 1723      PT LONG TERM GOAL #1   Title Pt. I with HEP to increase L shoulder AROM to WNL as compared to R shoulder to improve pain-free mobility.     Baseline Decrease L shoulder AROM in supine (pain tolerable range):  flexion (131 deg.), abd. (102 deg.), ER (37 deg.), IR (74 deg.).     Time 8   Period Weeks   Status Partially Met     PT LONG TERM GOAL #2   Title Pt. will increase L deltoid/ biceps/ triceps strength to grossly 4+/5 MMT to improve return to household tasks/ running.    Baseline progressing well with L shoulder strength.  MMT in L shoulder grossly 4/5 MMT.     Time 8   Period Weeks   Status Partially Met     PT LONG TERM GOAL #3   Title Pt. will decrease QuickDASH to <20% to improve  pain-free moblity of L shoulder.     Baseline QuickDASH: 59.1% on 2/26,   40.9% on 02/05/17   Time 8   Period Weeks   Status Partially Met     PT LONG TERM GOAL #4   Title Pt. able to return to gym based ex./ running with no L shoulder restrictions (Phase III) to improve return to PLOF.     Baseline No gym based ex. at this time.  Pt. not driving at this time.    Time 8   Period Weeks   Status On-going            Plan - 02/06/17 1711    Clinical Impression Statement Pt. remains pain focused/ muscle guarded with L shoulder A/PROM.  Decrease L shoulder AROM in supine (pain tolerable range):  flexion (131 deg.), abd. (102 deg.), ER (37 deg.), IR (74 deg.).  QuickDASH: 40.9%.  Slight L UT/ levator musculature tightness as compared to R side and  benefits from gentle c-spine stretches/ ice to L shoulder at end of tx. session.  Pt. has started isometric/ resisted ex. program in a pain tolerable range with min. cuing for proper technique.  Pt. following MD protocol with all aspects of PT tx. session.  Pt. will continue to benefit from skilled PT services to maximize L shoulder ROM/ stability to promote return to pain-free functional tasks.     Rehab Potential Excellent   PT Frequency 2x / week   PT Duration 8 weeks   PT Treatment/Interventions ADLs/Self Care Home Management;Aquatic Therapy;Cryotherapy;Electrical Stimulation;Therapeutic exercise;Patient/family education;Neuromuscular re-education;Manual techniques;Scar mobilization;Passive range of motion   PT Next Visit Plan Discuss MD f/u visit.  Progress strengthening HEP next tx. session.     PT Home Exercise Plan see handouts   Consulted and Agree with Plan of Care Patient      Patient will benefit from skilled therapeutic intervention in order to improve the following deficits and impairments:  Pain, Decreased mobility, Decreased scar mobility, Hypomobility, Decreased strength, Decreased range of motion, Decreased endurance, Decreased activity tolerance, Impaired flexibility  Visit Diagnosis: Acute pain of left shoulder  Shoulder joint stiffness, left  Muscle weakness (generalized)     Problem List There are no active problems to display for this patient.  Pura Spice, PT, DPT # [phone] 02/06/2017, 6:10 PM  [facility] [address] [place], [place], [address] Phone: [phone]   Fax:  [phone]  Name: Sara Gomez MRN: [id number] Date of Birth: Mar 15, 1996

## 2017-02-07 ENCOUNTER — Encounter: Payer: 59 | Admitting: Physical Therapy

## 2017-02-07 ENCOUNTER — Ambulatory Visit: Payer: BLUE CROSS/BLUE SHIELD | Admitting: Physical Therapy

## 2017-02-07 DIAGNOSIS — M25612 Stiffness of left shoulder, not elsewhere classified: Secondary | ICD-10-CM

## 2017-02-07 DIAGNOSIS — M25512 Pain in left shoulder: Secondary | ICD-10-CM

## 2017-02-07 DIAGNOSIS — M6281 Muscle weakness (generalized): Secondary | ICD-10-CM

## 2017-02-08 NOTE — Therapy (Signed)
[facility] [address] [place], [place], [address] Phone: [phone]   Fax:  [phone]  Physical Therapy Treatment  Patient Details  Name: Sara Gomez MRN: [id number] Date of Birth: [birth year] Referring Provider: Dr. Jeannie Fend  Encounter Date: 02/07/2017      PT End of Session - 02/08/17 1647    Visit Number 10   Number of Visits 16   Date for PT Re-Evaluation 03/05/17   PT Start Time 8416   PT Stop Time 1610   PT Time Calculation (min) 59 min   Activity Tolerance Patient tolerated treatment well;Patient limited by pain   Behavior During Therapy [facility] for tasks assessed/performed      No past medical history on file.  No past surgical history on file.  There were no vitals filed for this visit.      Subjective Assessment - 02/08/17 1641    Subjective Pt. states Dr. Jeannie Fend is happy with pts. progress and wants continued PT with focus on strengthening.  Pt. is able to return to driving today.  Pt. states she will be out of town next week at beach and is starting observation at local dentist.     Limitations Lifting;Standing;Writing;House hold activities   Patient Stated Goals Increase L shoulder ROM/ strength to improve pain-free mobility.     Currently in Pain? Yes   Pain Score 3    Pain Location Shoulder   Pain Orientation Left   Pain Descriptors / Indicators Aching   Pain Type Surgical pain      TherEx Standing A/AROM with wand: press ups/flexion/abd/ER 10x3 each with 3 second holds. Supine L shoulder isometrics 5x10 seconds of flexion, ext, ER , IR , abd, add in different planes of motion.  Seated Nautilus: lat. Pull downs 20#/ scap. Retraction 20#/ tricep ext. 20#/ sh. Adduction 20#/ bicep curls 10# 20x each.  Reviewed strengthening HEP.   Manual SupineofL shoulder AA/PROM 10x3 in flexion/ abd./ ER to MD required ROM. Limited L shoulder ROM due to pain/ muscle guarding, esp. With ER. Rhythmic rotation  5x 30 seconds, Supine L upper trap STM and occipital release 3x 30 seconds   Pt response for medical necessity: benefits from L shoulder ROM, isometrics, and strength to return to functional activities.  Progress HEP next tx.        PT Long Term Goals - 02/06/17 1723      PT LONG TERM GOAL #1   Title Pt. I with HEP to increase L shoulder AROM to WNL as compared to R shoulder to improve pain-free mobility.     Baseline Decrease L shoulder AROM in supine (pain tolerable range):  flexion (131 deg.), abd. (102 deg.), ER (37 deg.), IR (74 deg.).     Time 8   Period Weeks   Status Partially Met     PT LONG TERM GOAL #2   Title Pt. will increase L deltoid/ biceps/ triceps strength to grossly 4+/5 MMT to improve return to household tasks/ running.    Baseline progressing well with L shoulder strength.  MMT in L shoulder grossly 4/5 MMT.     Time 8   Period Weeks   Status Partially Met     PT LONG TERM GOAL #3   Title Pt. will decrease QuickDASH to <20% to improve pain-free moblity of L shoulder.     Baseline QuickDASH: 59.1% on 2/26,   40.9% on 02/05/17   Time 8   Period Weeks   Status  Partially Met     PT LONG TERM GOAL #4   Title Pt. able to return to gym based ex./ running with no L shoulder restrictions (Phase III) to improve return to PLOF.     Baseline No gym based ex. at this time.  Pt. not driving at this time.    Time 8   Period Weeks   Status On-going           Plan - 02/08/17 1649    Clinical Impression Statement Pt. remains pain limited/ focused during L shoulder A/PROM ex.  Pt. requires verbal cuing/ motivation to progress L shoulder resisted ex. program.  Pt. remains hesitant with any increase in resistance training due to pain/fear of pain.  PT encourage pt. to remain compliant with L shoulder resisted ex. program while on vacation.     Rehab Potential Excellent   PT Frequency 2x / week   PT Duration 8 weeks   PT Treatment/Interventions ADLs/Self Care Home  Management;Aquatic Therapy;Cryotherapy;Electrical Stimulation;Therapeutic exercise;Patient/family education;Neuromuscular re-education;Manual techniques;Scar mobilization;Passive range of motion   PT Next Visit Plan Progress strengthening HEP next tx. session.     PT Home Exercise Plan see handouts   Consulted and Agree with Plan of Care Patient      Patient will benefit from skilled therapeutic intervention in order to improve the following deficits and impairments:  Pain, Decreased mobility, Decreased scar mobility, Hypomobility, Decreased strength, Decreased range of motion, Decreased endurance, Decreased activity tolerance, Impaired flexibility  Visit Diagnosis: Acute pain of left shoulder  Shoulder joint stiffness, left  Muscle weakness (generalized)     Problem List There are no active problems to display for this patient.  Pura Spice, PT, DPT # [phone] 02/08/2017, 4:57 PM  [facility] [address] [place], [place], [address] Phone: [phone]   Fax:  [phone]  Name: Sara Gomez MRN: [id number] Date of Birth: 12/04/1995

## 2017-02-19 ENCOUNTER — Ambulatory Visit: Payer: BLUE CROSS/BLUE SHIELD | Attending: Orthopedic Surgery | Admitting: Physical Therapy

## 2017-02-19 ENCOUNTER — Encounter: Payer: Self-pay | Admitting: Physical Therapy

## 2017-02-19 ENCOUNTER — Encounter: Payer: 59 | Admitting: Physical Therapy

## 2017-02-19 DIAGNOSIS — M6281 Muscle weakness (generalized): Secondary | ICD-10-CM | POA: Diagnosis not present

## 2017-02-19 DIAGNOSIS — M25512 Pain in left shoulder: Secondary | ICD-10-CM

## 2017-02-19 DIAGNOSIS — M25612 Stiffness of left shoulder, not elsewhere classified: Secondary | ICD-10-CM | POA: Diagnosis not present

## 2017-02-19 NOTE — Therapy (Signed)
[facility] [address] [place], [place], [address] Phone: [phone]   Fax:  [phone]  Physical Therapy Treatment  Patient Details  Name: Josalyn Dettmann Chicas MRN: [id number] Date of Birth: Feb 05, 1996 Referring Provider: Dr. Jeannie Fend  Encounter Date: 02/19/2017      PT End of Session - 02/19/17 0929    Visit Number 11   Number of Visits 16   Date for PT Re-Evaluation 03/05/17   PT Start Time 0928   PT Stop Time 1006   PT Time Calculation (min) 38 min   Activity Tolerance Patient tolerated treatment well;Patient limited by pain   Behavior During Therapy [facility] for tasks assessed/performed      History reviewed. No pertinent past medical history.  History reviewed. No pertinent surgical history.  There were no vitals filed for this visit.      Subjective Assessment - 02/19/17 0929    Subjective Pt reports she did not complete all of her HEP while on vacation last week and only did her stretching exercises.  Pt reports she is still sore in front of shoulder similar to what she reported last session.  Pt reports the drive was very painful.  She used the TENS unit while at the beach which provided her with some relief.   Limitations Lifting;Standing;Writing;House hold activities   Patient Stated Goals Increase L shoulder ROM/ strength to improve pain-free mobility.     Currently in Pain? No/denies   Multiple Pain Sites No      Pt arrived late, limiting her session.    Therapeutic Exercise:  Standing A/AROM with wand: flexion, abd, ER 10x3 each direction.   With GTB 2x10 each: L elbow flexion, L shoulder D1, L shoulder D2, L shoulder extension. D1 very challenging for the pt. Cues to relax shoulders when performing D1. (added to HEP and provided handout)    Manual Therapy:  Supine L shoulder AA/PROM 10x3 in flexion/ abd/ ER. Limited L shoulder ROM due to pain/ muscle guarding, esp. With ER.   Supine L upper trap STM x3  minutes   Suboccipital release 3x 30 seconds             PT Education - 02/19/17 1011    Education provided Yes   Education Details new HEP; exercise technique   Person(s) Educated Patient   Methods Explanation;Demonstration;Verbal cues   Comprehension Verbalized understanding;Returned demonstration;Need further instruction             PT Long Term Goals - 02/06/17 1723      PT LONG TERM GOAL #1   Title Pt. I with HEP to increase L shoulder AROM to WNL as compared to R shoulder to improve pain-free mobility.     Baseline Decrease L shoulder AROM in supine (pain tolerable range):  flexion (131 deg.), abd. (102 deg.), ER (37 deg.), IR (74 deg.).     Time 8   Period Weeks   Status Partially Met     PT LONG TERM GOAL #2   Title Pt. will increase L deltoid/ biceps/ triceps strength to grossly 4+/5 MMT to improve return to household tasks/ running.    Baseline progressing well with L shoulder strength.  MMT in L shoulder grossly 4/5 MMT.     Time 8   Period Weeks   Status Partially Met     PT LONG TERM GOAL #3   Title Pt. will decrease QuickDASH to <20% to improve pain-free moblity of L shoulder.  Baseline QuickDASH: 59.1% on 2/26,   40.9% on 02/05/17   Time 8   Period Weeks   Status Partially Met     PT LONG TERM GOAL #4   Title Pt. able to return to gym based ex./ running with no L shoulder restrictions (Phase III) to improve return to PLOF.     Baseline No gym based ex. at this time.  Pt. not driving at this time.    Time 8   Period Weeks   Status On-going               Plan - 02/19/17 1013    Clinical Impression Statement Pt's L shoulder ROM remains limited, especially in ER and pt did not complete her HEP this past week.  Emphasized importance of HEP and provided pt with handout including new therapeutic exercises initiated today.  Pt demonstrates increased shoulder elevation with D1 exercise and requires cues for posture and technique.  Pt  demonstrates difficulty with L shoulder abduction exercises.  She will benefit from continued skilled PT interventions for improved ROM, strength, and functional use of LUE.   Rehab Potential Excellent   PT Frequency 2x / week   PT Duration 8 weeks   PT Treatment/Interventions ADLs/Self Care Home Management;Aquatic Therapy;Cryotherapy;Electrical Stimulation;Therapeutic exercise;Patient/family education;Neuromuscular re-education;Manual techniques;Scar mobilization;Passive range of motion   PT Next Visit Plan Progress strengthening HEP next tx. session.     PT Home Exercise Plan L shoulder D1, D2, extension, bicep curl (pt provided with HEP2Go handout   Consulted and Agree with Plan of Care Patient      Patient will benefit from skilled therapeutic intervention in order to improve the following deficits and impairments:  Pain, Decreased mobility, Decreased scar mobility, Hypomobility, Decreased strength, Decreased range of motion, Decreased endurance, Decreased activity tolerance, Impaired flexibility  Visit Diagnosis: Acute pain of left shoulder  Shoulder joint stiffness, left  Muscle weakness (generalized)     Problem List There are no active problems to display for this patient.    Collie Siad PT, DPT 02/19/2017, 10:17 AM  [facility] [address] [place], [place], [address] Phone: [phone]   Fax:  [phone]  Name: Brennah Quraishi Lhommedieu MRN: [id number] Date of Birth: Feb 19, 1996

## 2017-02-21 ENCOUNTER — Encounter: Payer: 59 | Admitting: Physical Therapy

## 2017-02-21 ENCOUNTER — Ambulatory Visit: Payer: BLUE CROSS/BLUE SHIELD | Admitting: Physical Therapy

## 2017-02-21 DIAGNOSIS — M25512 Pain in left shoulder: Secondary | ICD-10-CM | POA: Diagnosis not present

## 2017-02-21 DIAGNOSIS — M25612 Stiffness of left shoulder, not elsewhere classified: Secondary | ICD-10-CM

## 2017-02-21 DIAGNOSIS — M6281 Muscle weakness (generalized): Secondary | ICD-10-CM

## 2017-02-22 ENCOUNTER — Encounter: Payer: Self-pay | Admitting: Physical Therapy

## 2017-02-22 NOTE — Therapy (Signed)
[facility] [address] [place], [place], [address] Phone: [phone]   Fax:  [phone]  Physical Therapy Treatment  Patient Details  Name: Sara Gomez MRN: [id number] Date of Birth: 10-14-1996 Referring Provider: Dr. Jeannie Fend  Encounter Date: 02/21/2017      PT End of Session - 02/22/17 1235    Visit Number 12   Number of Visits 16   Date for PT Re-Evaluation 03/05/17   PT Start Time 0817   PT Stop Time 0911   PT Time Calculation (min) 54 min   Activity Tolerance Patient tolerated treatment well;Patient limited by pain   Behavior During Therapy [facility] for tasks assessed/performed      History reviewed. No pertinent past medical history.  History reviewed. No pertinent surgical history.  There were no vitals filed for this visit.      Subjective Assessment - 02/22/17 1233    Subjective Pt. states she is sore in L shoulder but is more concerned of R shoulder, which started to hurt a couple days ago.  Pt. concerned R shoulder is going to dislocate and doesn't want to undergo R shoulder surgery.     Limitations Lifting;Standing;Writing;House hold activities   Patient Stated Goals Increase L shoulder ROM/ strength to improve pain-free mobility.     Currently in Pain? No/denies      TherEx Standing A/AROM with wand: press ups/flexion/abd/ER 10x3 each with 3 second holds. Supine L shoulder isometrics 5x10 seconds of flexion, ext, ER , IR , abd, add in different planes of motion. Seated Nautilus: lat. Pull downs 30#/ scap. Retraction 30#/ tricep ext. 20#/ sh. Adduction 20#/ bicep curls 10# 20x each. Reviewed strengthening HEP with GTB.  Manual SupineofL shoulder AA/PROM 10x3 in flexion/ abd./ ER to MD required ROM. Limited L shoulder ROM due to pain/ muscle guarding, esp. With ER. Rhythmic rotation 5x 30 seconds, Supine L upper trap STM and occipital release 3x 30 seconds.  Reassessment of R shoulder capsule  (moderate hypermobility).     Pt response for medical necessity: benefits from L shoulder ROM, isometrics, and strength to return to functional activities. Progress HEP next tx. As tolerated.        PT Long Term Goals - 02/06/17 1723      PT LONG TERM GOAL #1   Title Pt. I with HEP to increase L shoulder AROM to WNL as compared to R shoulder to improve pain-free mobility.     Baseline Decrease L shoulder AROM in supine (pain tolerable range):  flexion (131 deg.), abd. (102 deg.), ER (37 deg.), IR (74 deg.).     Time 8   Period Weeks   Status Partially Met     PT LONG TERM GOAL #2   Title Pt. will increase L deltoid/ biceps/ triceps strength to grossly 4+/5 MMT to improve return to household tasks/ running.    Baseline progressing well with L shoulder strength.  MMT in L shoulder grossly 4/5 MMT.     Time 8   Period Weeks   Status Partially Met     PT LONG TERM GOAL #3   Title Pt. will decrease QuickDASH to <20% to improve pain-free moblity of L shoulder.     Baseline QuickDASH: 59.1% on 2/26,   40.9% on 02/05/17   Time 8   Period Weeks   Status Partially Met     PT LONG TERM GOAL #4   Title Pt. able to return to gym based ex./ running with  no L shoulder restrictions (Phase III) to improve return to PLOF.     Baseline No gym based ex. at this time.  Pt. not driving at this time.    Time 8   Period Weeks   Status On-going            Plan - 02/22/17 1236    Clinical Impression Statement Pt. remains guarded with L shoulder ROM (flexion/ ER) with functional movement patterns/ strengthening ex. in clinic.  R shoulder capsule was assessed and moderate hypermobility noted in capsule.  Pt. reports posterior deltoid pain with R shoulder AROM.  PT encourage pt. to continue with strengthening/ stability ex. program on B UE to improve pain-free mobility/ decrease risk of dislocation.     Rehab Potential Excellent   PT Frequency 2x / week   PT Duration 8 weeks   PT  Treatment/Interventions ADLs/Self Care Home Management;Aquatic Therapy;Cryotherapy;Electrical Stimulation;Therapeutic exercise;Patient/family education;Neuromuscular re-education;Manual techniques;Scar mobilization;Passive range of motion   PT Next Visit Plan Progress strengthening HEP next tx. session.  Recheck R shoulder.   PT Home Exercise Plan L shoulder D1, D2, extension, bicep curl (pt provided with HEP2Go handout   Consulted and Agree with Plan of Care Patient      Patient will benefit from skilled therapeutic intervention in order to improve the following deficits and impairments:  Pain, Decreased mobility, Decreased scar mobility, Hypomobility, Decreased strength, Decreased range of motion, Decreased endurance, Decreased activity tolerance, Impaired flexibility  Visit Diagnosis: Acute pain of left shoulder  Shoulder joint stiffness, left  Muscle weakness (generalized)     Problem List There are no active problems to display for this patient.  Pura Spice, PT, DPT # [phone] 02/22/2017, 12:45 PM  [facility] [address] [place], [place], [address] Phone: [phone]   Fax:  [phone]  Name: Micah Galeno Detwiler MRN: [id number] Date of Birth: May 11, 1996

## 2017-02-23 DIAGNOSIS — J019 Acute sinusitis, unspecified: Secondary | ICD-10-CM | POA: Diagnosis not present

## 2017-02-26 ENCOUNTER — Ambulatory Visit: Payer: BLUE CROSS/BLUE SHIELD | Admitting: Physical Therapy

## 2017-02-26 ENCOUNTER — Encounter: Payer: 59 | Admitting: Physical Therapy

## 2017-02-26 DIAGNOSIS — M6281 Muscle weakness (generalized): Secondary | ICD-10-CM

## 2017-02-26 DIAGNOSIS — M25512 Pain in left shoulder: Secondary | ICD-10-CM

## 2017-02-26 DIAGNOSIS — M25612 Stiffness of left shoulder, not elsewhere classified: Secondary | ICD-10-CM

## 2017-02-27 NOTE — Therapy (Addendum)
[facility] [address] [place], [place], [address] Phone: [phone]   Fax:  [phone]  Physical Therapy Treatment  Patient Details  Name: Sara Gomez MRN: [id number] Date of Birth: 1996/03/11 Referring Provider: Dr. Jeannie Fend  Encounter Date: 02/26/2017      PT End of Session - 02/27/17 1432    Visit Number 13   Number of Visits 16   Date for PT Re-Evaluation 03/05/17   PT Start Time 0920   PT Stop Time 1007   PT Time Calculation (min) 47 min   Activity Tolerance Patient tolerated treatment well;Patient limited by pain   Behavior During Therapy [facility] for tasks assessed/performed      No past medical history on file.  No past surgical history on file.  There were no vitals filed for this visit.      Subjective Assessment - 02/27/17 1432    Limitations Lifting;Standing;Writing;House hold activities   Patient Stated Goals Increase L shoulder ROM/ strength to improve pain-free mobility.     Currently in Pain? No/denies      Pt. reports R shoulder may have "popped out" while sitting at work and pt. stopped by PT to be instructed in taping (K-tape).  R shoulder presented with hypermobility but was not out of joint.     TherEx Standing A/AROM with wand: press ups/flexion/abd/ER 10x3 each with 3 second holds.  Supine L shoulder isometrics 5x10 seconds of flexion, ext, ER , IR , abd, add in different planes of motion. Seated Nautilus: lat. Pull downs 30#/ scap. Retraction 30#/ tricep ext. 20#/ sh. Adduction 20#/ bicep curls 10#20x each. Bodyblade (all planes)- 30 sec x 3 each (fatigue noted).    Manual SupineofL shoulder AA/PROM 10x3 in flexion/ abd./ ER to MD required ROM. Limited L shoulder ROM due to pain/ muscle guarding, esp. With ER. Rhythmic rotation 5x 30 seconds, Supine L upper trap STM and occipital release 3x 30 seconds.  Reassessment of R shoulder capsule (moderate hypermobility)- issued tape and  instructed in stability taping technique.      Pt response for medical necessity: benefits from L shoulder ROM, isometrics, and strength to return to functional activities. Progress HEP next tx. As tolerated.    Pt. reports no pain in L shoulder at this time but is more concerned with R shoulder discomfort/ laxity.  Pt. has been more active at work but remains with limited compliance with HEP.  Pt. showing slow but consistent progress with L shoulder stability ex. program.  Moderate muscle fatigue and requires frequencey cuing/ motivation to progress during tx. session.        PT Long Term Goals - 02/06/17 1723      PT LONG TERM GOAL #1   Title Pt. I with HEP to increase L shoulder AROM to WNL as compared to R shoulder to improve pain-free mobility.     Baseline Decrease L shoulder AROM in supine (pain tolerable range):  flexion (131 deg.), abd. (102 deg.), ER (37 deg.), IR (74 deg.).     Time 8   Period Weeks   Status Partially Met     PT LONG TERM GOAL #2   Title Pt. will increase L deltoid/ biceps/ triceps strength to grossly 4+/5 MMT to improve return to household tasks/ running.    Baseline progressing well with L shoulder strength.  MMT in L shoulder grossly 4/5 MMT.     Time 8   Period Weeks   Status Partially Met  PT LONG TERM GOAL #3   Title Pt. will decrease QuickDASH to <20% to improve pain-free moblity of L shoulder.     Baseline QuickDASH: 59.1% on 2/26,   40.9% on 02/05/17   Time 8   Period Weeks   Status Partially Met     PT LONG TERM GOAL #4   Title Pt. able to return to gym based ex./ running with no L shoulder restrictions (Phase III) to improve return to PLOF.     Baseline No gym based ex. at this time.  Pt. not driving at this time.    Time 8   Period Weeks   Status On-going           Plan - 02/27/17 1433    Rehab Potential Excellent   PT Frequency 2x / week   PT Duration 8 weeks   PT Treatment/Interventions ADLs/Self Care Home  Management;Aquatic Therapy;Cryotherapy;Electrical Stimulation;Therapeutic exercise;Patient/family education;Neuromuscular re-education;Manual techniques;Scar mobilization;Passive range of motion   PT Next Visit Plan Continue to progress with strengthening as tolerated.  Discuss MD f/u date?    PT Home Exercise Plan L shoulder D1, D2, extension, bicep curl (pt provided with HEP2Go handout   Consulted and Agree with Plan of Care Patient      Patient will benefit from skilled therapeutic intervention in order to improve the following deficits and impairments:  Pain, Decreased mobility, Decreased scar mobility, Hypomobility, Decreased strength, Decreased range of motion, Decreased endurance, Decreased activity tolerance, Impaired flexibility  Visit Diagnosis: Acute pain of left shoulder  Shoulder joint stiffness, left  Muscle weakness (generalized)     Problem List There are no active problems to display for this patient.  Pura Spice, PT, DPT # [phone] 02/27/2017, 2:34 PM  [facility] [address] [place], [place], [address] Phone: [phone]   Fax:  [phone]  Name: Sara Gomez MRN: [id number] Date of Birth: 1996/09/12

## 2017-02-28 ENCOUNTER — Encounter: Payer: 59 | Admitting: Physical Therapy

## 2017-02-28 ENCOUNTER — Ambulatory Visit: Payer: BLUE CROSS/BLUE SHIELD | Admitting: Physical Therapy

## 2017-02-28 DIAGNOSIS — M25512 Pain in left shoulder: Secondary | ICD-10-CM

## 2017-02-28 DIAGNOSIS — M25612 Stiffness of left shoulder, not elsewhere classified: Secondary | ICD-10-CM

## 2017-02-28 DIAGNOSIS — M6281 Muscle weakness (generalized): Secondary | ICD-10-CM

## 2017-03-01 ENCOUNTER — Encounter: Payer: Self-pay | Admitting: Physical Therapy

## 2017-03-01 NOTE — Therapy (Addendum)
[facility] [address] [place], [place], [address] Phone: [phone]   Fax:  [phone]  Physical Therapy Treatment  Patient Details  Name: Sara Gomez MRN: [id number] Date of Birth: 09/26/1996 Referring Provider: Dr. Creighton  Encounter Date: 02/28/2017      PT End of Session - 03/01/17 1215    Visit Number 14   Number of Visits 16   Date for PT Re-Evaluation 03/05/17   PT Start Time 0904   PT Stop Time 0955   PT Time Calculation (min) 51 min   Activity Tolerance Patient tolerated treatment well;Patient limited by pain   Behavior During Therapy WFL for tasks assessed/performed      History reviewed. No pertinent past medical history.  History reviewed. No pertinent surgical history.  There were no vitals filed for this visit.      Subjective Assessment - 03/01/17 1214    Limitations Lifting;Standing;Writing;House hold activities   Patient Stated Goals Increase L shoulder ROM/ strength to improve pain-free mobility.     Currently in Pain? No/denies      Pt. states she is really tired today and almost called to cancel PT appt.  Pt. more concerned about R sh. today than L.     TherEx Standing A/AROM with wand: press ups/flexion/abd/ER 10x3 each with 3 second holds (marked increase in flexion noted).  Supine L shoulder isometrics 5x10 seconds of flexion, ext, ER , IR , abd, add in different planes of motion (pain tolerable).  Supine serratus punches with increase hold/ rhythmic stabs 5x30 sec. Each.  PT really encourage pt. To complete HEP/ remain compliant.   Pt response for medical necessity: benefits from L shoulder ROM, isometrics, and strength to return to functional activities. Progress HEP next tx. As tolerated.   Pt. not willing to participate with Nautilus ex. program due to being tired.  Pt. reports constant R shoulder discomfort and feels R sh. is going to "pop out".  Pt. making progress with L  shoulder stability and AROM of ER put require moderate cuing/ encouragement to complete sets/reps.  PT encouraged pt. to become more compliant with HEP to promote greater strength gains.          PT Long Term Goals - 02/06/17 1723      PT LONG TERM GOAL #1   Title Pt. I with HEP to increase L shoulder AROM to WNL as compared to R shoulder to improve pain-free mobility.     Baseline Decrease L shoulder AROM in supine (pain tolerable range):  flexion (131 deg.), abd. (102 deg.), ER (37 deg.), IR (74 deg.).     Time 8   Period Weeks   Status Partially Met     PT LONG TERM GOAL #2   Title Pt. will increase L deltoid/ biceps/ triceps strength to grossly 4+/5 MMT to improve return to household tasks/ running.    Baseline progressing well with L shoulder strength.  MMT in L shoulder grossly 4/5 MMT.     Time 8   Period Weeks   Status Partially Met     PT LONG TERM GOAL #3   Title Pt. will decrease QuickDASH to <20% to improve pain-free moblity of L shoulder.     Baseline QuickDASH: 59.1% on 2/26,   40.9% on 02/05/17   Time 8   Period Weeks   Status Partially Met     PT LONG TERM GOAL #4   Title Pt. able to return to gym   based ex./ running with no L shoulder restrictions (Phase III) to improve return to PLOF.     Baseline No gym based ex. at this time.  Pt. not driving at this time.    Time 8   Period Weeks   Status On-going               Plan - 03/01/17 1215    Rehab Potential Excellent   PT Frequency 2x / week   PT Duration 8 weeks   PT Treatment/Interventions ADLs/Self Care Home Management;Aquatic Therapy;Cryotherapy;Electrical Stimulation;Therapeutic exercise;Patient/family education;Neuromuscular re-education;Manual techniques;Scar mobilization;Passive range of motion   PT Next Visit Plan Continue to progress with strengthening as tolerated.  MD f/u next Wednesday.   PT Home Exercise Plan L shoulder D1, D2, extension, bicep curl (pt provided with HEP2Go handout       Patient will benefit from skilled therapeutic intervention in order to improve the following deficits and impairments:  Pain, Decreased mobility, Decreased scar mobility, Hypomobility, Decreased strength, Decreased range of motion, Decreased endurance, Decreased activity tolerance, Impaired flexibility  Visit Diagnosis: Acute pain of left shoulder  Shoulder joint stiffness, left  Muscle weakness (generalized)     Problem List There are no active problems to display for this patient.  Michael C Sherk, PT, DPT # [id number] 03/01/2017, 12:16 PM  [facility] [address] [place], [place], [address] Phone: [phone]   Fax:  [phone]  Name: Sara Gomez MRN: [id number] Date of Birth: 07/07/1996   

## 2017-03-05 ENCOUNTER — Ambulatory Visit: Payer: BLUE CROSS/BLUE SHIELD | Admitting: Physical Therapy

## 2017-03-05 DIAGNOSIS — M6281 Muscle weakness (generalized): Secondary | ICD-10-CM

## 2017-03-05 DIAGNOSIS — M25512 Pain in left shoulder: Secondary | ICD-10-CM

## 2017-03-05 DIAGNOSIS — M25612 Stiffness of left shoulder, not elsewhere classified: Secondary | ICD-10-CM

## 2017-03-05 NOTE — Therapy (Signed)
[facility] [address] [place], [place], [address] Phone: [phone]   Fax:  [phone]  Physical Therapy Treatment  Patient Details  Name: Sara Gomez MRN: [id number] Date of Birth: 07/11/1996 Referring Provider: Dr. Jeannie Fend  Encounter Date: 03/05/2017      PT End of Session - 03/05/17 1735    Visit Number 15   Number of Visits 16   Date for PT Re-Evaluation 03/05/17   PT Start Time 0810   PT Stop Time 0903   PT Time Calculation (min) 53 min   Activity Tolerance Patient tolerated treatment well;Patient limited by pain   Behavior During Therapy [facility] for tasks assessed/performed      No past medical history on file.  No past surgical history on file.  There were no vitals filed for this visit.      Subjective Assessment - 03/05/17 0814    Subjective Pt. reports 4/10 R shoulder pain and reports no pain in L shoulder at this time.  Pt. states she returns to MD this Wednesday.     Limitations Lifting;Standing;Writing;House hold activities   Patient Stated Goals Increase L shoulder ROM/ strength to improve pain-free mobility.     Currently in Pain? Yes   Pain Score 4    Pain Location Shoulder   Pain Orientation Right   Pain Descriptors / Indicators Aching   Pain Type Acute pain     QuickDASH:  34.1%  L shoulder ER: 24 deg.        Flexion: 154 deg.     TherEx Seated Nautilus: lat. Pull downs 40#/ scap. Retraction 30#/ sh. Extension 20#/ chest press 20#/ tricep ext. 20#30x each.  Pt. Required frequent encouragement to complete all 30 repetitions.  Standing B shoulder AROM all planes (as tolerated)- pt. Guarded/pain limited with R shoulder, not L.  Supine L shoulder isometrics 5x10 seconds of flexion, ext, ER , IR , abd, add in different planes of motion. 2# serratus punches/3# bicep curls 25x each.  Discussed HEP/ work-related tasks.     Pt response for medical necessity: benefits from L shoulder ROM,  isometrics, and strength to return to functional activities. Discuss MD f/u next tx.  RECERT NEXT TX.          PT Long Term Goals - 02/06/17 1723      PT LONG TERM GOAL #1   Title Pt. I with HEP to increase L shoulder AROM to WNL as compared to R shoulder to improve pain-free mobility.     Baseline Decrease L shoulder AROM in supine (pain tolerable range):  flexion (131 deg.), abd. (102 deg.), ER (37 deg.), IR (74 deg.).     Time 8   Period Weeks   Status Partially Met     PT LONG TERM GOAL #2   Title Pt. will increase L deltoid/ biceps/ triceps strength to grossly 4+/5 MMT to improve return to household tasks/ running.    Baseline progressing well with L shoulder strength.  MMT in L shoulder grossly 4/5 MMT.     Time 8   Period Weeks   Status Partially Met     PT LONG TERM GOAL #3   Title Pt. will decrease QuickDASH to <20% to improve pain-free moblity of L shoulder.     Baseline QuickDASH: 59.1% on 2/26,   40.9% on 02/05/17   Time 8   Period Weeks   Status Partially Met     PT LONG TERM GOAL #4  Title Pt. able to return to gym based ex./ running with no L shoulder restrictions (Phase III) to improve return to PLOF.     Baseline No gym based ex. at this time.  Pt. not driving at this time.    Time 8   Period Weeks   Status On-going            Plan - 03/05/17 1735    Rehab Potential Excellent   PT Frequency 2x / week   PT Duration 8 weeks   PT Treatment/Interventions ADLs/Self Care Home Management;Aquatic Therapy;Cryotherapy;Electrical Stimulation;Therapeutic exercise;Patient/family education;Neuromuscular re-education;Manual techniques;Scar mobilization;Passive range of motion   PT Next Visit Plan Continue to progress with strengthening as tolerated.  Discuss MD f/u,  Check on TENS use.     PT Home Exercise Plan L shoulder D1, D2, extension, bicep curl (pt provided with HEP2Go handout   Consulted and Agree with Plan of Care Patient      Patient will benefit from  skilled therapeutic intervention in order to improve the following deficits and impairments:  Pain, Decreased mobility, Decreased scar mobility, Hypomobility, Decreased strength, Decreased range of motion, Decreased endurance, Decreased activity tolerance, Impaired flexibility  Visit Diagnosis: Acute pain of left shoulder  Shoulder joint stiffness, left  Muscle weakness (generalized)     Problem List There are no active problems to display for this patient.  Pura Spice, PT, DPT # [phone] 03/05/2017, 5:37 PM  [place] [facility] [address] [place], [place], [address] Phone: [phone]   Fax:  [phone]  Name: Sara Gomez MRN: [id number] Date of Birth: [birth year]

## 2017-03-07 DIAGNOSIS — M25511 Pain in right shoulder: Secondary | ICD-10-CM | POA: Diagnosis not present

## 2017-03-12 ENCOUNTER — Encounter: Payer: 59 | Admitting: Physical Therapy

## 2017-03-14 ENCOUNTER — Encounter: Payer: 59 | Admitting: Physical Therapy

## 2017-03-15 DIAGNOSIS — M25311 Other instability, right shoulder: Secondary | ICD-10-CM | POA: Diagnosis not present

## 2017-03-28 ENCOUNTER — Ambulatory Visit: Payer: BLUE CROSS/BLUE SHIELD | Attending: Orthopedic Surgery | Admitting: Physical Therapy

## 2017-03-28 DIAGNOSIS — M25611 Stiffness of right shoulder, not elsewhere classified: Secondary | ICD-10-CM | POA: Insufficient documentation

## 2017-03-28 DIAGNOSIS — M25512 Pain in left shoulder: Secondary | ICD-10-CM | POA: Insufficient documentation

## 2017-03-28 DIAGNOSIS — M25511 Pain in right shoulder: Secondary | ICD-10-CM | POA: Diagnosis present

## 2017-03-28 DIAGNOSIS — M25612 Stiffness of left shoulder, not elsewhere classified: Secondary | ICD-10-CM | POA: Diagnosis present

## 2017-03-28 DIAGNOSIS — M6281 Muscle weakness (generalized): Secondary | ICD-10-CM | POA: Diagnosis present

## 2017-03-29 NOTE — Therapy (Addendum)
[facility] [address] [place], [place], [address] Phone: [phone]   Fax:  [phone]  Physical Therapy Treatment  Patient Details  Name: Nereida Schepp Pincock MRN: [id number] Date of Birth: [birth year] Referring Provider: Dr. Jeannie Fend  Encounter Date: 03/28/2017       PT End of Session - 03/29/17 1805    Visit Number 16   Number of Visits 24   Date for PT Re-Evaluation 04/25/17   PT Start Time 1110   PT Stop Time 1203   PT Time Calculation (min) 53 min   Activity Tolerance Patient tolerated treatment well;Patient limited by pain   Behavior During Therapy [facility] for tasks assessed/performed      No past medical history on file.  No past surgical history on file.  There were no vitals filed for this visit.      Subjective Assessment - 04/19/17 1823    Limitations Lifting;Standing;Writing;House hold activities   Patient Stated Goals Increase L shoulder ROM/ strength to improve pain-free mobility.  R shoulder ROM   Currently in Pain? Yes   Pain Score 4    Pain Location Shoulder   Pain Orientation Right   Pain Descriptors / Indicators Aching   Pain Type Surgical pain;Acute pain      Pt. has been on hold with PT secondary to recent R shoulder surgery.  S/p R CAP/labral repair.  See MD protocol.  Pt. arrived to PT in arm sling.  Pt. has not been compliant with L shoulder ROM/ strengthening ex. due to recent R shoulder surgery/ pain.     See HEP.    Pt. is a pleasant 21 y/o female s/p R CAP/ labral repair.  Pt. known well to PT secondary to recent L shoulder surgery (similar to R).  Pt. arrived to PT with use of sling/ MD protocol.  Pt. states hydrocodone helps with L upper back/ scapular pain due to sling use.  Pt. not sleeping well at night.  Seated L shoulder AROM: flexion 146 deg., abd. 144 deg., ER and IR WFL.  Pt. not able to actively or passively move R shoulder at this time.  Good R elbow/wrist AROM.   PT explainted  MD protocol and progression of PROM at 3 weeks post-op.   Grip: L 66#/ R 56#.  QuickDASH: 61.4%.  Pt. will benefit from skilled PT services to increase R shoulder AROM/ strength to improve functional mobility as compared to L.         PT Long Term Goals - 03/29/17 0740      PT LONG TERM GOAL #1   Title Pt. I with HEP to increase R shoulder PROM to WNL as compared to L shoulder to improve pain-free mobility.     Baseline No R shoulder AROM at this time.     Time 8   Period Weeks   Status Partially Met     PT LONG TERM GOAL #2   Title Pt. will increase R deltoid/ biceps/ triceps strength to grossly 4+/5 MMT to improve return to household tasks/ running.    Baseline progressing well with L shoulder strength.  Unable to test R shoulder at this tim.    Time 8   Period Weeks   Status New     PT LONG TERM GOAL #3   Title Pt. will decrease QuickDASH to <20% to improve pain-free moblity of L shoulder.     Baseline QuickDASH: 59.1% on 2/26,   40.9% on 02/05/17  Time 8   Period Weeks   Status On-going     PT LONG TERM GOAL #4   Title Pt. able to return to gym based ex./ running with no L or R shoulder restrictions (Phase III) to improve return to PLOF.     Baseline No gym based ex. at this time.  Pt. not driving at this time.    Time 8   Period Weeks   Status On-going          Patient will benefit from skilled therapeutic intervention in order to improve the following deficits and impairments:  Pain, Decreased mobility, Decreased scar mobility, Hypomobility, Decreased strength, Decreased range of motion, Decreased endurance, Decreased activity tolerance, Impaired flexibility  Visit Diagnosis: Acute pain of left shoulder  Shoulder joint stiffness, left  Muscle weakness (generalized)  Acute pain of right shoulder  Shoulder joint stiffness, right     Problem List There are no active problems to display for this patient.  Pura Spice, PT, DPT # [phone] 03/29/17, 7:44  AM  [facility] [address] [place], [place], [address] Phone: [phone]   Fax:  [phone]  Name: Daffney Greenly Panjwani MRN: [id number] Date of Birth: November 23, 1995

## 2017-04-05 ENCOUNTER — Ambulatory Visit: Payer: BLUE CROSS/BLUE SHIELD | Admitting: Physical Therapy

## 2017-04-05 DIAGNOSIS — M25612 Stiffness of left shoulder, not elsewhere classified: Secondary | ICD-10-CM

## 2017-04-05 DIAGNOSIS — M25511 Pain in right shoulder: Secondary | ICD-10-CM

## 2017-04-05 DIAGNOSIS — M25512 Pain in left shoulder: Secondary | ICD-10-CM | POA: Diagnosis not present

## 2017-04-05 DIAGNOSIS — M6281 Muscle weakness (generalized): Secondary | ICD-10-CM

## 2017-04-05 DIAGNOSIS — M25611 Stiffness of right shoulder, not elsewhere classified: Secondary | ICD-10-CM

## 2017-04-06 NOTE — Therapy (Addendum)
[facility] [address] [place], [place], [address] Phone: [phone]   Fax:  [phone]  Physical Therapy Treatment  Patient Details  Name: Sara Gomez MRN: [id number] Date of Birth: 10/16/1996 Referring Provider: Dr. Jeannie Fend  Encounter Date: 04/05/2017   Treatment 17 of 24.  Recert: 3/90/30   No past medical history on file.  No past surgical history on file.  There were no vitals filed for this visit.      Subjective Assessment - 04/06/17 1401    Limitations Lifting;Standing;Writing;House hold activities   Patient Stated Goals Increase L shoulder ROM/ strength to improve pain-free mobility.     Currently in Pain? Yes   Pain Score 2    Pain Location Shoulder   Pain Orientation Right      Pt. has been doing R elbow/wrist AROM.  Limited L shouder strengthening ex. reported.  2/10 R shoulder pain.      OBJECTIVE:  There.ex.: Reviewed HEP.  Seated pulley R shoulder PROM flexion and abd. 10x2 each with cuing to increase static holds.  L shoulder isometric (all planes)- moderate resistance.   Manual tx.:  Supine R shoulder PROM 10x3 in flexion/ abd./ ER to MD required ROM.  See MD protocol.  Discussed scar massage and benefits of icing R shoulder on a consistent basis.                 Pt response for medical necessity: benefits from L shoulder ROM ex. Progression to improve sh. Joint mobility and prepare of L UE isometrics.  No increase c/o pain but moderate joint stiffness/ hypomobility noted.       Pt. is 3 weeks s/p surgery.  MD protocol allows PROM to start today.  R sh. PROM in supine: flexion (75 deg.),  abd. (75 deg.),  ER (10 deg.), IR (45 deg.).  Pt. able to increase R shoulder PROM to 82 deg. after manual tx.        PT Long Term Goals - 02/06/17 1723      PT LONG TERM GOAL #1   Title Pt. I with HEP to increase L shoulder AROM to WNL as compared to R shoulder to improve pain-free mobility.     Baseline Decrease L shoulder AROM in supine (pain tolerable range):  flexion (131 deg.), abd. (102 deg.), ER (37 deg.), IR (74 deg.).     Time 8   Period Weeks   Status Partially Met     PT LONG TERM GOAL #2   Title Pt. will increase L deltoid/ biceps/ triceps strength to grossly 4+/5 MMT to improve return to household tasks/ running.    Baseline progressing well with L shoulder strength.  MMT in L shoulder grossly 4/5 MMT.     Time 8   Period Weeks   Status Partially Met     PT LONG TERM GOAL #3   Title Pt. will decrease QuickDASH to <20% to improve pain-free moblity of L shoulder.     Baseline QuickDASH: 59.1% on 2/26,   40.9% on 02/05/17   Time 8   Period Weeks   Status Partially Met     PT LONG TERM GOAL #4   Title Pt. able to return to gym based ex./ running with no L shoulder restrictions (Phase III) to improve return to PLOF.     Baseline No gym based ex. at this time.  Pt. not driving at this time.    Time 8   Period  Weeks   Status On-going               Plan - 04/06/17 1403    Rehab Potential Excellent   PT Frequency 2x / week   PT Duration 4 weeks   PT Treatment/Interventions ADLs/Self Care Home Management;Aquatic Therapy;Cryotherapy;Electrical Stimulation;Therapeutic exercise;Patient/family education;Neuromuscular re-education;Manual techniques;Scar mobilization;Passive range of motion   PT Next Visit Plan Follow MD protocol for R shoulder.  Progress L shoulder strengthening.    PT Home Exercise Plan L shoulder D1, D2, extension, bicep curl (pt provided with HEP2Go handout   Consulted and Agree with Plan of Care Patient      Patient will benefit from skilled therapeutic intervention in order to improve the following deficits and impairments:  Pain, Decreased mobility, Decreased scar mobility, Hypomobility, Decreased strength, Decreased range of motion, Decreased endurance, Decreased activity tolerance, Impaired flexibility  Visit Diagnosis: Acute pain of  left shoulder  Shoulder joint stiffness, left  Muscle weakness (generalized)  Acute pain of right shoulder  Shoulder joint stiffness, right     Problem List There are no active problems to display for this patient.  Pura Spice, PT, DPT # [phone] 04/06/2017, 2:05 PM  [facility] [address] [place], [place], [address] Phone: [phone]   Fax:  [phone]  Name: Sara Gomez MRN: [id number] Date of Birth: July 31, 1996

## 2017-04-11 ENCOUNTER — Ambulatory Visit: Payer: BLUE CROSS/BLUE SHIELD | Admitting: Physical Therapy

## 2017-04-11 DIAGNOSIS — M25511 Pain in right shoulder: Secondary | ICD-10-CM

## 2017-04-11 DIAGNOSIS — M25611 Stiffness of right shoulder, not elsewhere classified: Secondary | ICD-10-CM

## 2017-04-11 DIAGNOSIS — M25512 Pain in left shoulder: Secondary | ICD-10-CM

## 2017-04-11 DIAGNOSIS — M25612 Stiffness of left shoulder, not elsewhere classified: Secondary | ICD-10-CM

## 2017-04-11 DIAGNOSIS — M6281 Muscle weakness (generalized): Secondary | ICD-10-CM

## 2017-04-12 NOTE — Therapy (Addendum)
[facility] [address] [place], [place], [address] Phone: [phone]   Fax:  [phone]  Physical Therapy Treatment  Patient Details  Name: Sara Gomez MRN: [id number] Date of Birth: [birth year] Referring Provider: Dr. Jeannie Fend  Encounter Date: 04/11/2017   Treatment: 18 of 24.  Recert: 8/50/27   No past medical history on file.  No past surgical history on file.  There were no vitals filed for this visit.      Subjective Assessment - 04/12/17 1241    Limitations Lifting;Standing;Writing;House hold activities   Patient Stated Goals Increase L shoulder ROM/ strength to improve pain-free mobility.  R shoulder ROM   Currently in Pain? Yes   Pain Score 6    Pain Location Shoulder   Pain Orientation Right        Pt. states her R shoulder is hurting 6/10 currently.  Pt. reports 8/10 pain at worst.       OBJECTIVE: There.ex.: Reviewed HEP. Seated pulley R shoulder PROM flexion and abd. 10x2 each with cuing to increase static holds. L shoulder isometric (all planes)- moderate resistance.  L sh. Rhythmic stabs (all planes)- moderate resistance.   Manual tx.: Supine R shoulder PROM 10x3 in flexion/ abd./ ER to MD required ROM. See MD protocol. STM to R shoulder/biceps.    Pt response for medical necessity: benefits from L shoulder ROM ex. Pt. Pain limited/ guarded with R shoulder PROM.        Yellow bulb squeezes: R 2#/ L 5#.  Pt. doing well with L shoulder ROM/ strengthening in a pain-free range.  Pt. is guarded/ pain limited with R shoulder PROM.  (+) R deltoid/ prox. bicep tenderness with palpation.  R UT/cervical discomfort and benefits from stretches.          PT Education - 04/12/17 1242    Education provided Yes   Education Details R shoulder pulley (flexion/ abd.)/ pendulum   Person(s) Educated Patient   Methods Demonstration;Handout;Explanation   Comprehension Verbalized  understanding;Returned demonstration             PT Long Term Goals - 02/06/17 1723      PT LONG TERM GOAL #1   Title Pt. I with HEP to increase L shoulder AROM to WNL as compared to R shoulder to improve pain-free mobility.     Baseline Decrease L shoulder AROM in supine (pain tolerable range):  flexion (131 deg.), abd. (102 deg.), ER (37 deg.), IR (74 deg.).     Time 8   Period Weeks   Status Partially Met     PT LONG TERM GOAL #2   Title Pt. will increase L deltoid/ biceps/ triceps strength to grossly 4+/5 MMT to improve return to household tasks/ running.    Baseline progressing well with L shoulder strength.  MMT in L shoulder grossly 4/5 MMT.     Time 8   Period Weeks   Status Partially Met     PT LONG TERM GOAL #3   Title Pt. will decrease QuickDASH to <20% to improve pain-free moblity of L shoulder.     Baseline QuickDASH: 59.1% on 2/26,   40.9% on 02/05/17   Time 8   Period Weeks   Status Partially Met     PT LONG TERM GOAL #4   Title Pt. able to return to gym based ex./ running with no L shoulder restrictions (Phase III) to improve return to PLOF.     Baseline No gym based  ex. at this time.  Pt. not driving at this time.    Time 8   Period Weeks   Status On-going               Plan - 04/12/17 1242    Clinical Presentation Stable   Clinical Decision Making Moderate   Rehab Potential Excellent   PT Frequency 2x / week   PT Duration 4 weeks   PT Treatment/Interventions ADLs/Self Care Home Management;Aquatic Therapy;Cryotherapy;Electrical Stimulation;Therapeutic exercise;Patient/family education;Neuromuscular re-education;Manual techniques;Scar mobilization;Passive range of motion   PT Next Visit Plan Follow MD protocol for R shoulder.  Progress L shoulder strengthening.    PT Home Exercise Plan L shoulder D1, D2, extension, bicep curl (pt provided with HEP2Go handout   Consulted and Agree with Plan of Care Patient      Patient will benefit from  skilled therapeutic intervention in order to improve the following deficits and impairments:  Pain, Decreased mobility, Decreased scar mobility, Hypomobility, Decreased strength, Decreased range of motion, Decreased endurance, Decreased activity tolerance, Impaired flexibility  Visit Diagnosis: Acute pain of left shoulder  Shoulder joint stiffness, left  Muscle weakness (generalized)  Acute pain of right shoulder  Shoulder joint stiffness, right     Problem List There are no active problems to display for this patient.  Pura Spice, PT, DPT # [id number]  04/12/2017, 12:43 PM  [place] [address] [place], [place], [address] Phone: [phone]   Fax:  [phone]  Name: Sara Gomez MRN: [id number] Date of Birth: 02/24/1996

## 2017-04-16 ENCOUNTER — Ambulatory Visit: Payer: BLUE CROSS/BLUE SHIELD | Attending: Orthopedic Surgery | Admitting: Physical Therapy

## 2017-04-16 ENCOUNTER — Encounter: Payer: 59 | Admitting: Physical Therapy

## 2017-04-16 DIAGNOSIS — M25511 Pain in right shoulder: Secondary | ICD-10-CM | POA: Diagnosis present

## 2017-04-16 DIAGNOSIS — M25611 Stiffness of right shoulder, not elsewhere classified: Secondary | ICD-10-CM

## 2017-04-16 DIAGNOSIS — M25512 Pain in left shoulder: Secondary | ICD-10-CM

## 2017-04-16 DIAGNOSIS — M6281 Muscle weakness (generalized): Secondary | ICD-10-CM | POA: Diagnosis present

## 2017-04-16 DIAGNOSIS — M25612 Stiffness of left shoulder, not elsewhere classified: Secondary | ICD-10-CM | POA: Diagnosis present

## 2017-04-17 NOTE — Therapy (Addendum)
[place] [facility] [address] [place], [place], [address] Phone: [phone]   Fax:  [phone]  Physical Therapy Treatment  Patient Details  Name: Sara Gomez MRN: [id number] Date of Birth: 12-23-1995 Referring Provider: Dr. Jeannie Fend  Encounter Date: 04/16/2017      PT End of Session - 04/17/17 1426    Visit Number 19   Number of Visits 24   Date for PT Re-Evaluation 04/25/17   PT Start Time 0936   PT Stop Time 1031   PT Time Calculation (min) 55 min   Activity Tolerance Patient tolerated treatment well;Patient limited by pain   Behavior During Therapy [facility] for tasks assessed/performed      No past medical history on file.  No past surgical history on file.  There were no vitals filed for this visit.      Subjective Assessment - 04/17/17 1426    Limitations Lifting;Standing;Writing;House hold activities   Patient Stated Goals Increase L shoulder ROM/ strength to improve pain-free mobility.  R shoulder ROM   Currently in Pain? Yes       R shoulder 2/10 and L shoulder 4/10.  Neck pain resulting from sling use.      OBJECTIVE: There.ex.: Reviewed HEP. Seated pulley Rshoulder PROM flexion and abd. 10x2 each with cuing to increase static holds. L shoulder isometric (all planes)- moderate resistance.  L sh. Rhythmic stabs (all planes)- moderate resistance. Manual tx.: Supine Rshoulder PROM 10x3 in flexion/ abd./ ER to MD required ROM. See MD protocol.STM to R shoulder/biceps.    Pt response for medical necessity: benefits from L shoulder ROM ex. Pt. Pain limited/ guarded with R shoulder PROM.     Pt. Presents with R UT muscle tightness/ tenderness and instructed on proper cervical stretches.         PT Long Term Goals - 02/06/17 1723      PT LONG TERM GOAL #1   Title Pt. I with HEP to increase L shoulder AROM to WNL as compared to R shoulder to improve pain-free mobility.     Baseline  Decrease L shoulder AROM in supine (pain tolerable range):  flexion (131 deg.), abd. (102 deg.), ER (37 deg.), IR (74 deg.).     Time 8   Period Weeks   Status Partially Met     PT LONG TERM GOAL #2   Title Pt. will increase L deltoid/ biceps/ triceps strength to grossly 4+/5 MMT to improve return to household tasks/ running.    Baseline progressing well with L shoulder strength.  MMT in L shoulder grossly 4/5 MMT.     Time 8   Period Weeks   Status Partially Met     PT LONG TERM GOAL #3   Title Pt. will decrease QuickDASH to <20% to improve pain-free moblity of L shoulder.     Baseline QuickDASH: 59.1% on 2/26,   40.9% on 02/05/17   Time 8   Period Weeks   Status Partially Met     PT LONG TERM GOAL #4   Title Pt. able to return to gym based ex./ running with no L shoulder restrictions (Phase III) to improve return to PLOF.     Baseline No gym based ex. at this time.  Pt. not driving at this time.    Time 8   Period Weeks   Status On-going               Plan - 04/17/17 1427  Clinical Presentation Stable   Clinical Decision Making Moderate   Rehab Potential Excellent   PT Frequency 2x / week   PT Duration 4 weeks   PT Treatment/Interventions ADLs/Self Care Home Management;Aquatic Therapy;Cryotherapy;Electrical Stimulation;Therapeutic exercise;Patient/family education;Neuromuscular re-education;Manual techniques;Scar mobilization;Passive range of motion   PT Next Visit Plan Follow MD protocol for R shoulder.  Progress L shoulder strengthening.    PT Home Exercise Plan L shoulder D1, D2, extension, bicep curl (pt provided with HEP2Go handout   Consulted and Agree with Plan of Care Patient      Patient will benefit from skilled therapeutic intervention in order to improve the following deficits and impairments:  Pain, Decreased mobility, Decreased scar mobility, Hypomobility, Decreased strength, Decreased range of motion, Decreased endurance, Decreased activity tolerance,  Impaired flexibility  Visit Diagnosis: Acute pain of left shoulder  Shoulder joint stiffness, left  Muscle weakness (generalized)  Acute pain of right shoulder  Shoulder joint stiffness, right     Problem List There are no active problems to display for this patient.  Pura Spice, PT, DPT # [id number] 04/17/2017, 2:28 PM  [place] [address] [place], [place], [address] Phone: [phone]   Fax:  [phone]  Name: Sara Gomez MRN: [id number] Date of Birth: 16-Jul-1996

## 2017-04-18 ENCOUNTER — Ambulatory Visit: Payer: BLUE CROSS/BLUE SHIELD | Admitting: Physical Therapy

## 2017-04-18 DIAGNOSIS — M25512 Pain in left shoulder: Secondary | ICD-10-CM | POA: Diagnosis not present

## 2017-04-18 DIAGNOSIS — M6281 Muscle weakness (generalized): Secondary | ICD-10-CM

## 2017-04-18 DIAGNOSIS — M25611 Stiffness of right shoulder, not elsewhere classified: Secondary | ICD-10-CM

## 2017-04-18 DIAGNOSIS — M25511 Pain in right shoulder: Secondary | ICD-10-CM

## 2017-04-18 DIAGNOSIS — M25612 Stiffness of left shoulder, not elsewhere classified: Secondary | ICD-10-CM

## 2017-04-19 NOTE — Therapy (Addendum)
[place] [facility] [address] [place], [place], [address] Phone: [phone]   Fax:  [phone]  Physical Therapy Treatment  Patient Details  Name: Sara Gomez MRN: [id number] Date of Birth: 04/16/1996 Referring Provider: Dr. Creighton  Encounter Date: 04/18/2017  Treatment 20 of 24.  Recert: 05/23/17   No past medical history on file.  No past surgical history on file.  There were no vitals filed for this visit.      Subjective Assessment - 04/19/17 1823    Subjective Pt. states she didn't sleep well last night.  R shoulder is aching today.  Pt. entered PT without use of sling today.  Pt. returns to MD next week.  Will discuss if she can return to driving.     Limitations Lifting;Standing;Writing;House hold activities   Patient Stated Goals Increase L shoulder ROM/ strength to improve pain-free mobility.  R shoulder ROM   Currently in Pain? Yes   Pain Score 4    Pain Location Shoulder   Pain Orientation Right   Pain Descriptors / Indicators Aching   Pain Type Surgical pain;Acute pain            [facility] PT Assessment - 04/20/17 0001      Assessment   Medical Diagnosis s/p R CAP/labral repair   Referring Provider Dr. Creighton   Onset Date/Surgical Date 03/15/17   Hand Dominance Right       MD appt. 6/15    Supine R shoulder PROM: flexion (90 deg.)- MD protocol.  Abd. (90 deg.)- MD protocol.  ER (34 deg.), IR (48 deg.).  Pt. Has >R sh. Flexion/ abd. PROM but limited by strict MD protocol.      OBJECTIVE: There.ex.: Seated pulley Rshoulder PROM flexion and abd. 10x2 each with cuing to increase static holds (warm-up)- pain tolerable. L shoulder isometric (all planes)- moderate resistance.  L sh. Rhythmic stabs (all planes)- moderate resistance.  Supine 3# L shoulder press-ups/ 2# horiz. Abd. 20x each Manual tx.: Supine Rshoulder PROM 10x3 in flexion/ abd./ ER to MD required ROM. See MD protocol.STM to R  shoulder/biceps.    Pt response for medical necessity: benefits from L shoulder ROM ex. Pt. Pain limited/ guarded with R shoulder PROM.          PT Long Term Goals - 03/29/17 0740      PT LONG TERM GOAL #1   Title Pt. I with HEP to increase R shoulder PROM to WNL as compared to L shoulder to improve pain-free mobility.     Baseline No R shoulder AROM at this time.     Time 8   Period Weeks   Status Partially Met     PT LONG TERM GOAL #2   Title Pt. will increase R deltoid/ biceps/ triceps strength to grossly 4+/5 MMT to improve return to household tasks/ running.    Baseline progressing well with L shoulder strength.  Unable to test R shoulder at this tim.    Time 8   Period Weeks   Status New     PT LONG TERM GOAL #3   Title Pt. will decrease QuickDASH to <20% to improve pain-free moblity of L shoulder.     Baseline QuickDASH: 59.1% on 2/26,   40.9% on 02/05/17   Time 8   Period Weeks   Status On-going     PT LONG TERM GOAL #4   Title Pt. able to return to gym based ex./ running with no L   or R shoulder restrictions (Phase III) to improve return to PLOF.     Baseline No gym based ex. at this time.  Pt. not driving at this time.    Time 8   Period Weeks   Status On-going               Plan - 04/19/17 1427    Clinical Impression Statement Primary focus on R shoulder PROM in supine position and cervical stretches today.  L shoulder stability continues to progress slowly with resisted ex.   No pain in L shoulder but L UT discomfort due to sling/ sleeping posture.  See MD protocol for restrictions to R shoulder for mobs./ PROM.     Clinical Presentation Stable   Clinical Decision Making Moderate   Rehab Potential Excellent   PT Frequency 2x / week   PT Duration 4 weeks   PT Treatment/Interventions ADLs/Self Care Home Management;Aquatic Therapy;Cryotherapy;Electrical Stimulation;Therapeutic exercise;Patient/family education;Neuromuscular  re-education;Manual techniques;Scar mobilization;Passive range of motion   PT Next Visit Plan Follow MD protocol for R shoulder.  Progress L shoulder strengthening.    PT Home Exercise Plan L shoulder D1, D2, extension, bicep curl (pt provided with HEP2Go handout   Consulted and Agree with Plan of Care Patient      Patient will benefit from skilled therapeutic intervention in order to improve the following deficits and impairments:  Pain, Decreased mobility, Decreased scar mobility, Hypomobility, Decreased strength, Decreased range of motion, Decreased endurance, Decreased activity tolerance, Impaired flexibility  Visit Diagnosis: Acute pain of left shoulder  Shoulder joint stiffness, left  Muscle weakness (generalized)  Acute pain of right shoulder  Shoulder joint stiffness, right     Problem List There are no active problems to display for this patient.  Michael C Sherk, PT, DPT # [id number] 04/20/2017, 2:30 PM  [place] [facility] [address] [place], [place], [address] Phone: [phone]   Fax:  [phone]  Name: Sara Gomez MRN: [id number] Date of Birth: 04/15/1996   

## 2017-04-20 NOTE — Addendum Note (Signed)
Addended by: Dorene GrebeSHERK, Kreg Earhart on: 04/20/2017 07:48 AM   Modules accepted: Orders

## 2017-04-23 ENCOUNTER — Ambulatory Visit: Payer: BLUE CROSS/BLUE SHIELD | Admitting: Physical Therapy

## 2017-04-23 ENCOUNTER — Encounter: Payer: Self-pay | Admitting: Physical Therapy

## 2017-04-23 DIAGNOSIS — M25512 Pain in left shoulder: Secondary | ICD-10-CM | POA: Diagnosis not present

## 2017-04-23 DIAGNOSIS — M6281 Muscle weakness (generalized): Secondary | ICD-10-CM

## 2017-04-23 DIAGNOSIS — M25511 Pain in right shoulder: Secondary | ICD-10-CM

## 2017-04-23 DIAGNOSIS — M25612 Stiffness of left shoulder, not elsewhere classified: Secondary | ICD-10-CM

## 2017-04-23 DIAGNOSIS — M25611 Stiffness of right shoulder, not elsewhere classified: Secondary | ICD-10-CM

## 2017-04-23 NOTE — Therapy (Signed)
[place] [facility] [address] [place], [place], [address] Phone: [phone]   Fax:  [phone]  Physical Therapy Treatment  Patient Details  Name: Sara Gomez MRN: [id number] Date of Birth: [birth year] Referring Provider: Dr. Jeannie Fend  Encounter Date: 04/23/2017      PT End of Session - 04/23/17 0830    Visit Number 21   Number of Visits 24   Date for PT Re-Evaluation 05/23/17   PT Start Time 0811   PT Stop Time 0852   PT Time Calculation (min) 41 min   Activity Tolerance Patient tolerated treatment well;Patient limited by pain   Behavior During Therapy [facility] for tasks assessed/performed      History reviewed. No pertinent past medical history.  History reviewed. No pertinent surgical history.  There were no vitals filed for this visit.      Subjective Assessment - 04/23/17 0828    Subjective Pt reports her MD appointment is Friday and she is hoping she is given the go ahead to drive.  She has been experiencing some achiness in her R UT region.     Limitations Lifting;Standing;Writing;House hold activities   Patient Stated Goals Increase L shoulder ROM/ strength to improve pain-free mobility.  R shoulder ROM   Currently in Pain? No/denies        TREATMENT   Manual Therapy:  Supine R shoulder PROM: flexion (90 deg.)- MD protocol. ?  Supine R shoulder PROM Abd. (90 deg.)- MD protocol.  STM to R shoulder/biceps.  ?  Therapeutic Exercise:  Seated pulley R?shoulder PROM flexion and abd. 10x2 each with cuing to increase static holds (warm-up)- pain tolerable. ?  L shoulder isometric F/Abd/ER/IR/E with 10 second holds x5 each direction- moderate resistance.  Supine L sh. Rhythmic stabs (all planes)- moderate resistance.2x30 seconds, pt demonstrates and reports fatigue with this.  Supine 3# L shoulder press-ups and 2# horiz. Abd. 20x each        PT Education - 04/23/17 0820    Education provided Yes   Education  Details Exercise technique   Person(s) Educated Patient   Methods Explanation;Demonstration   Comprehension Verbalized understanding;Returned demonstration;Need further instruction             PT Long Term Goals - 03/29/17 0740      PT LONG TERM GOAL #1   Title Pt. I with HEP to increase R shoulder PROM to WNL as compared to L shoulder to improve pain-free mobility.     Baseline No R shoulder AROM at this time.     Time 8   Period Weeks   Status Partially Met     PT LONG TERM GOAL #2   Title Pt. will increase R deltoid/ biceps/ triceps strength to grossly 4+/5 MMT to improve return to household tasks/ running.    Baseline progressing well with L shoulder strength.  Unable to test R shoulder at this tim.    Time 8   Period Weeks   Status New     PT LONG TERM GOAL #3   Title Pt. will decrease QuickDASH to <20% to improve pain-free moblity of L shoulder.     Baseline QuickDASH: 59.1% on 2/26,   40.9% on 02/05/17   Time 8   Period Weeks   Status On-going     PT LONG TERM GOAL #4   Title Pt. able to return to gym based ex./ running with no L or R shoulder restrictions (Phase III) to improve return to  PLOF.     Baseline No gym based ex. at this time.  Pt. not driving at this time.    Time 8   Period Weeks   Status On-going               Plan - 04/23/17 1761    Clinical Impression Statement Pt tolerated R shoulder PROM in supine per MD protocol.  She responded positively to STM R bicep and UT region.  She tolerated all light strengthening isometric exercises well and demonstrates fatigue with rhythmic stabilization exercises LUE.  Pt will benefit from continued skilled PT interventions per MD protocol for improved progression of RUE exercises and continued strengthening of LUE.     Rehab Potential Excellent   PT Frequency 2x / week   PT Duration 4 weeks   PT Treatment/Interventions ADLs/Self Care Home Management;Aquatic Therapy;Cryotherapy;Electrical  Stimulation;Therapeutic exercise;Patient/family education;Neuromuscular re-education;Manual techniques;Scar mobilization;Passive range of motion   PT Next Visit Plan Follow MD protocol for R shoulder.  Progress L shoulder strengthening.    PT Home Exercise Plan L shoulder D1, D2, extension, bicep curl (pt provided with HEP2Go handout   Consulted and Agree with Plan of Care Patient      Patient will benefit from skilled therapeutic intervention in order to improve the following deficits and impairments:  Pain, Decreased mobility, Decreased scar mobility, Hypomobility, Decreased strength, Decreased range of motion, Decreased endurance, Decreased activity tolerance, Impaired flexibility  Visit Diagnosis: Acute pain of left shoulder  Shoulder joint stiffness, left  Muscle weakness (generalized)  Acute pain of right shoulder  Shoulder joint stiffness, right     Problem List There are no active problems to display for this patient.   Collie Siad PT, DPT 04/23/2017, 8:53 AM  [place] [facility] [address] [place], [place], [address] Phone: [phone]   Fax:  [phone]  Name: Sara Gomez MRN: [id number] Date of Birth: 1996/07/22

## 2017-04-25 ENCOUNTER — Encounter: Payer: Self-pay | Admitting: Physical Therapy

## 2017-04-25 ENCOUNTER — Ambulatory Visit: Payer: BLUE CROSS/BLUE SHIELD | Admitting: Physical Therapy

## 2017-04-25 DIAGNOSIS — M25612 Stiffness of left shoulder, not elsewhere classified: Secondary | ICD-10-CM

## 2017-04-25 DIAGNOSIS — M25512 Pain in left shoulder: Secondary | ICD-10-CM | POA: Diagnosis not present

## 2017-04-25 DIAGNOSIS — M25611 Stiffness of right shoulder, not elsewhere classified: Secondary | ICD-10-CM

## 2017-04-25 DIAGNOSIS — M25511 Pain in right shoulder: Secondary | ICD-10-CM

## 2017-04-25 DIAGNOSIS — M6281 Muscle weakness (generalized): Secondary | ICD-10-CM

## 2017-04-25 NOTE — Therapy (Signed)
[place] [address] [place], [place], [address] Phone: [phone]   Fax:  [phone]  Physical Therapy Treatment  Patient Details  Name: Sara Gomez MRN: [id number] Date of Birth: 1996-05-02 Referring Provider: Dr. Jeannie Fend  Encounter Date: 04/25/2017      PT End of Session - 04/25/17 0946    Visit Number 22   Number of Visits 24   Date for PT Re-Evaluation 05/23/17   PT Start Time 0946   PT Stop Time 1021   PT Time Calculation (min) 35 min   Activity Tolerance Patient tolerated treatment well;Patient limited by pain   Behavior During Therapy [facility] for tasks assessed/performed      History reviewed. No pertinent past medical history.  History reviewed. No pertinent surgical history.  There were no vitals filed for this visit.      Subjective Assessment - 04/25/17 0944    Subjective Pt reports her R neck region is sore and achy pretty much all the time now.  No new complaints or concerns.     Limitations Lifting;Standing;Writing;House hold activities   Patient Stated Goals Increase L shoulder ROM/ strength to improve pain-free mobility.  R shoulder ROM   Currently in Pain? Yes   Pain Score 7    Pain Location Neck   Pain Orientation Right   Pain Descriptors / Indicators Aching   Multiple Pain Sites Yes   Pain Score 4   Pain Location Shoulder   Pain Orientation Right   Pain Descriptors / Indicators Aching       TREATMENT   Manual Therapy:  Supine R shoulder PROM: flexion (90 deg.)- MD protocol. x30?  Supine R shoulder PROM Abd. (90 deg.)- MD protocol. x30  STM to R biceps and UT region. Significant muscular tightness appreciated in R UT. Pt reports improvement following. Pain down to 4/10 following. X10 minutes  ?  Therapeutic Exercise:  Seated pulley R shoulder PROM flexion and abd. 10x3 each with cuing to increase static holds (warm-up)- pain tolerable. ?  L shoulder isometric F/Abd/ER/IR/E with 10  second holds x5 each direction- moderate resistance.  Supine L sh. Rhythmic stabs (all planes)- moderate resistance.2x30 seconds, pt demonstrates and reports fatigue with this.  Supine 3# L shoulder press-ups and 2# horiz. Abd. 20x each        PT Education - 04/25/17 0946    Education provided Yes   Education Details Reasoning behind interventions   Person(s) Educated Patient   Methods Explanation;Demonstration;Verbal cues   Comprehension Verbalized understanding;Returned demonstration;Verbal cues required;Need further instruction             PT Long Term Goals - 03/29/17 0740      PT LONG TERM GOAL #1   Title Pt. I with HEP to increase R shoulder PROM to WNL as compared to L shoulder to improve pain-free mobility.     Baseline No R shoulder AROM at this time.     Time 8   Period Weeks   Status Partially Met     PT LONG TERM GOAL #2   Title Pt. will increase R deltoid/ biceps/ triceps strength to grossly 4+/5 MMT to improve return to household tasks/ running.    Baseline progressing well with L shoulder strength.  Unable to test R shoulder at this tim.    Time 8   Period Weeks   Status New     PT LONG TERM GOAL #3   Title Pt. will decrease QuickDASH to <  20% to improve pain-free moblity of L shoulder.     Baseline QuickDASH: 59.1% on 2/26,   40.9% on 02/05/17   Time 8   Period Weeks   Status On-going     PT LONG TERM GOAL #4   Title Pt. able to return to gym based ex./ running with no L or R shoulder restrictions (Phase III) to improve return to PLOF.     Baseline No gym based ex. at this time.  Pt. not driving at this time.    Time 8   Period Weeks   Status On-going               Plan - 04/25/17 1002    Clinical Impression Statement Pt tolerated all interventions well.  She presented with R UT trigger points and increased muscular tension which improved with STM and ischemic compression to this region.  Pt is progressing well within protocol parameters and  will benefit from continued skilled PT interventions.    Rehab Potential Excellent   PT Frequency 2x / week   PT Duration 4 weeks   PT Treatment/Interventions ADLs/Self Care Home Management;Aquatic Therapy;Cryotherapy;Electrical Stimulation;Therapeutic exercise;Patient/family education;Neuromuscular re-education;Manual techniques;Scar mobilization;Passive range of motion   PT Next Visit Plan Follow MD protocol for R shoulder.  Progress L shoulder strengthening.    PT Home Exercise Plan L shoulder D1, D2, extension, bicep curl (pt provided with HEP2Go handout   Consulted and Agree with Plan of Care Patient      Patient will benefit from skilled therapeutic intervention in order to improve the following deficits and impairments:  Pain, Decreased mobility, Decreased scar mobility, Hypomobility, Decreased strength, Decreased range of motion, Decreased endurance, Decreased activity tolerance, Impaired flexibility  Visit Diagnosis: Acute pain of left shoulder  Shoulder joint stiffness, left  Muscle weakness (generalized)  Acute pain of right shoulder  Shoulder joint stiffness, right     Problem List There are no active problems to display for this patient.   Collie Siad PT, DPT 04/25/2017, 10:22 AM  [facility] [address] [place], [place], [address] Phone: [phone]   Fax:  [phone]  Name: Sara Gomez MRN: [id number] Date of Birth: [birth year]

## 2017-04-30 ENCOUNTER — Encounter: Payer: Self-pay | Admitting: Physical Therapy

## 2017-04-30 ENCOUNTER — Ambulatory Visit: Payer: BLUE CROSS/BLUE SHIELD | Admitting: Physical Therapy

## 2017-04-30 DIAGNOSIS — M25611 Stiffness of right shoulder, not elsewhere classified: Secondary | ICD-10-CM

## 2017-04-30 DIAGNOSIS — M25612 Stiffness of left shoulder, not elsewhere classified: Secondary | ICD-10-CM

## 2017-04-30 DIAGNOSIS — M25512 Pain in left shoulder: Secondary | ICD-10-CM | POA: Diagnosis not present

## 2017-04-30 DIAGNOSIS — M6281 Muscle weakness (generalized): Secondary | ICD-10-CM

## 2017-04-30 DIAGNOSIS — M25511 Pain in right shoulder: Secondary | ICD-10-CM

## 2017-04-30 NOTE — Therapy (Signed)
[facility] [address] [place], [place], [address] Phone: [phone]   Fax:  [phone]  Physical Therapy Treatment  Patient Details  Name: Sara Gomez MRN: [id number] Date of Birth: 07/08/1996 Referring Provider: Dr. Jeannie Fend  Encounter Date: 04/30/2017      PT End of Session - 04/30/17 0916    Visit Number 23   Number of Visits 24   Date for PT Re-Evaluation 05/23/17   PT Start Time 0901   PT Stop Time 0944   PT Time Calculation (min) 43 min   Activity Tolerance Patient tolerated treatment well;Patient limited by pain   Behavior During Therapy [facility] for tasks assessed/performed      History reviewed. No pertinent past medical history.  History reviewed. No pertinent surgical history.  There were no vitals filed for this visit.      Subjective Assessment - 04/30/17 0902    Subjective Pt reports her R shoulder was hurting this past weekend causing her to need to take her hydrocodone on Friday night and Saturday at 5pm after work due to pain.  Her MD took the block off her sling and pt believes this might have attributed to her increased pain over the weekend.  MD approved pt to drive and pt has driven a few times over the past weekend with slight increased pain if she turns the wheel with her hands at the top of the wheel.  No pain if she places her hands at the bottom of the wheel.     Limitations Lifting;Standing;Writing;House hold activities   Patient Stated Goals Increase L shoulder ROM/ strength to improve pain-free mobility.  R shoulder ROM   Currently in Pain? Yes   Pain Score 5    Pain Location Shoulder   Pain Orientation Right   Pain Descriptors / Indicators Aching   Pain Type Surgical pain   Pain Onset More than a month ago   Multiple Pain Sites Yes   Pain Score 5   Pain Location Neck   Pain Orientation Right   Pain Descriptors / Indicators Aching   Pain Type Chronic pain   Pain Onset More than a  month ago       TREATMENT  Manual Therapy:  Supine R shoulder PROM: flexion (90 deg.)- MD protocol. x30?  Supine R shoulder PROM Abd. (90 deg.)- MD protocol. x30  STM to R biceps, UT region, teres major and minor. Significant muscular tightness appreciated in R UT. Pt reports improvement following. X10 minutes  ?   Therapeutic Exercise:  Seated pulley R shoulder PROM flexion and abd. 10x3 each with cuing to increase static holds (warm-up)- pain tolerable. ?  L shoulder isometric F/Abd/ER/IR/E with 10 second holds x5 each direction- moderate resistance.  Supine L sh. Rhythmic stabs (all planes)- moderate resistance.2x30 seconds, pt demonstrates and reports fatigue with this.  Supine 3# L shoulder press-ups and 2# horiz. Abd. 20x each         PT Education - 04/30/17 0916    Education provided Yes   Education Details Exercise technique   Person(s) Educated Patient   Methods Explanation;Demonstration;Verbal cues   Comprehension Verbalized understanding;Returned demonstration;Verbal cues required;Need further instruction             PT Long Term Goals - 03/29/17 0740      PT LONG TERM GOAL #1   Title Pt. I with HEP to increase R shoulder PROM to WNL as compared to L shoulder to improve pain-free  mobility.     Baseline No R shoulder AROM at this time.     Time 8   Period Weeks   Status Partially Met     PT LONG TERM GOAL #2   Title Pt. will increase R deltoid/ biceps/ triceps strength to grossly 4+/5 MMT to improve return to household tasks/ running.    Baseline progressing well with L shoulder strength.  Unable to test R shoulder at this tim.    Time 8   Period Weeks   Status New     PT LONG TERM GOAL #3   Title Pt. will decrease QuickDASH to <20% to improve pain-free moblity of L shoulder.     Baseline QuickDASH: 59.1% on 2/26,   40.9% on 02/05/17   Time 8   Period Weeks   Status On-going     PT LONG TERM GOAL #4   Title Pt. able to return to gym based ex./  running with no L or R shoulder restrictions (Phase III) to improve return to PLOF.     Baseline No gym based ex. at this time.  Pt. not driving at this time.    Time 8   Period Weeks   Status On-going               Plan - 04/30/17 8177    Clinical Impression Statement Pt presented with increased pain in R shoulder and tension in neck region which responded well to STM to these regions.  She tolerated all interventions well and will likely be able to progress ROM soon per protocol.  She is interested in dry needling if appropriate next session to address tension.  She will benefit from continued skilled PT interventions for improved ROM, strength, and functional use of BUEs.   Rehab Potential Excellent   PT Frequency 2x / week   PT Duration 4 weeks   PT Treatment/Interventions ADLs/Self Care Home Management;Aquatic Therapy;Cryotherapy;Electrical Stimulation;Therapeutic exercise;Patient/family education;Neuromuscular re-education;Manual techniques;Scar mobilization;Passive range of motion   PT Next Visit Plan Follow MD protocol for R shoulder.  Progress L shoulder strengthening.    PT Home Exercise Plan L shoulder D1, D2, extension, bicep curl (pt provided with HEP2Go handout   Consulted and Agree with Plan of Care Patient      Patient will benefit from skilled therapeutic intervention in order to improve the following deficits and impairments:  Pain, Decreased mobility, Decreased scar mobility, Hypomobility, Decreased strength, Decreased range of motion, Decreased endurance, Decreased activity tolerance, Impaired flexibility  Visit Diagnosis: Acute pain of left shoulder  Shoulder joint stiffness, left  Muscle weakness (generalized)  Acute pain of right shoulder  Shoulder joint stiffness, right     Problem List There are no active problems to display for this patient.   Collie Siad PT, DPT 04/30/2017, 9:46 AM  [facility] [address] [place], [place], [address] Phone: [phone]   Fax:  [phone]  Name: Sara Gomez MRN: [id number] Date of Birth: April 29, 1996

## 2017-05-02 ENCOUNTER — Ambulatory Visit: Payer: BLUE CROSS/BLUE SHIELD | Admitting: Physical Therapy

## 2017-05-02 DIAGNOSIS — M6281 Muscle weakness (generalized): Secondary | ICD-10-CM

## 2017-05-02 DIAGNOSIS — M25511 Pain in right shoulder: Secondary | ICD-10-CM

## 2017-05-02 DIAGNOSIS — M25612 Stiffness of left shoulder, not elsewhere classified: Secondary | ICD-10-CM

## 2017-05-02 DIAGNOSIS — M25611 Stiffness of right shoulder, not elsewhere classified: Secondary | ICD-10-CM

## 2017-05-02 DIAGNOSIS — M25512 Pain in left shoulder: Secondary | ICD-10-CM

## 2017-05-03 NOTE — Therapy (Signed)
[facility] [address] [place], [place], [address] Phone: [phone]   Fax:  [phone]  Physical Therapy Treatment  Patient Details  Name: Sara Gomez MRN: [id number] Date of Birth: 1996-07-09 Referring Provider: Dr. Jeannie Fend  Encounter Date: 05/02/2017    No past medical history on file.  No past surgical history on file.  There were no vitals filed for this visit.      Subjective Assessment - 05/03/17 2033    Limitations Lifting;Standing;Writing;House hold activities   Patient Stated Goals Increase L shoulder ROM/ strength to improve pain-free mobility.  R shoulder ROM   Currently in Pain? Yes   Pain Score 5    Pain Location Shoulder   Pain Orientation Right   Pain Descriptors / Indicators Aching   Pain Type Surgical pain                                      PT Long Term Goals - 03/29/17 0740      PT LONG TERM GOAL #1   Title Pt. I with HEP to increase R shoulder PROM to WNL as compared to L shoulder to improve pain-free mobility.     Baseline No R shoulder AROM at this time.     Time 8   Period Weeks   Status Partially Met     PT LONG TERM GOAL #2   Title Pt. will increase R deltoid/ biceps/ triceps strength to grossly 4+/5 MMT to improve return to household tasks/ running.    Baseline progressing well with L shoulder strength.  Unable to test R shoulder at this tim.    Time 8   Period Weeks   Status New     PT LONG TERM GOAL #3   Title Pt. will decrease QuickDASH to <20% to improve pain-free moblity of L shoulder.     Baseline QuickDASH: 59.1% on 2/26,   40.9% on 02/05/17   Time 8   Period Weeks   Status On-going     PT LONG TERM GOAL #4   Title Pt. able to return to gym based ex./ running with no L or R shoulder restrictions (Phase III) to improve return to PLOF.     Baseline No gym based ex. at this time.  Pt. not driving at this time.    Time 8   Period Weeks   Status On-going             Patient will benefit from skilled therapeutic intervention in order to improve the following deficits and impairments:     Visit Diagnosis: Acute pain of left shoulder  Shoulder joint stiffness, left  Muscle weakness (generalized)  Acute pain of right shoulder  Shoulder joint stiffness, right     Problem List There are no active problems to display for this patient.   Pura Spice 05/03/2017, 8:36 PM  [facility] [address] [place], [place], [address] Phone: [phone]   Fax:  [phone]  Name: Sara Gomez MRN: [id number] Date of Birth: Feb 02, 1996

## 2017-05-07 ENCOUNTER — Ambulatory Visit: Payer: BLUE CROSS/BLUE SHIELD | Admitting: Physical Therapy

## 2017-05-07 DIAGNOSIS — M25512 Pain in left shoulder: Secondary | ICD-10-CM | POA: Diagnosis not present

## 2017-05-07 DIAGNOSIS — M25611 Stiffness of right shoulder, not elsewhere classified: Secondary | ICD-10-CM

## 2017-05-07 DIAGNOSIS — M25511 Pain in right shoulder: Secondary | ICD-10-CM

## 2017-05-07 DIAGNOSIS — M25612 Stiffness of left shoulder, not elsewhere classified: Secondary | ICD-10-CM

## 2017-05-07 DIAGNOSIS — M6281 Muscle weakness (generalized): Secondary | ICD-10-CM

## 2017-05-08 NOTE — Therapy (Signed)
[facility] [address] [place], [place], [address] Phone: [phone]   Fax:  [phone]  Physical Therapy Treatment  Patient Details  Name: Sara Gomez MRN: [id number] Date of Birth: 10-06-1996 Referring Provider: Dr. Jeannie Fend  Encounter Date: 05/07/2017      PT End of Session - 05/08/17 1657    Visit Number 24   Number of Visits 24   Date for PT Re-Evaluation 05/23/17   PT Start Time 1016   PT Stop Time 1101   PT Time Calculation (min) 45 min   Activity Tolerance Patient tolerated treatment well;Patient limited by pain   Behavior During Therapy [facility] for tasks assessed/performed      No past medical history on file.  No past surgical history on file.  There were no vitals filed for this visit.      Subjective Assessment - 05/08/17 1656    Subjective R shoulder hurting last night.  Pt. states she is working more hours at [employer].  No issues with L shoulder reported at this time.  Pt. states she saw MD at end of last week secondary to neck pain/ fever.  Pts. mother worried about ?meningitis (negative).      Limitations Lifting;Standing;Writing;House hold activities   Patient Stated Goals Increase L shoulder ROM/ strength to improve pain-free mobility.  R shoulder ROM   Currently in Pain? Yes   Pain Score 4    Pain Location Shoulder   Pain Orientation Right   Pain Descriptors / Indicators Aching   Pain Type Surgical pain      TREATMENT   Manual Therapy:   Supine R shoulder PROM: flexion (90 deg.)- MD protocol. x30?  Supine R shoulder PROM Abd. (90 deg.)- MD protocol. x30  STM to R biceps, UT region, teres major and minor. Significant muscular tightness appreciated in R UT. Cervical traction 3x with static holds.   ?   Therapeutic Exercise:   Supine L shoulder isometric F/Abd/ER/IR/E with 10 second holds x 10 each direction- moderate resistance.  Supine L sh. Rhythmic stabs (all planes)- moderate  resistance.2x30 seconds, pt demonstrates and reports fatigue with this.  Supine 3# L shoulder press-ups and 2# horiz. Abd. 20x each  Reviewed HEP.           PT Long Term Goals - 03/29/17 0740      PT LONG TERM GOAL #1   Title Pt. I with HEP to increase R shoulder PROM to WNL as compared to L shoulder to improve pain-free mobility.     Baseline No R shoulder AROM at this time.     Time 8   Period Weeks   Status Partially Met     PT LONG TERM GOAL #2   Title Pt. will increase R deltoid/ biceps/ triceps strength to grossly 4+/5 MMT to improve return to household tasks/ running.    Baseline progressing well with L shoulder strength.  Unable to test R shoulder at this tim.    Time 8   Period Weeks   Status New     PT LONG TERM GOAL #3   Title Pt. will decrease QuickDASH to <20% to improve pain-free moblity of L shoulder.     Baseline QuickDASH: 59.1% on 2/26,   40.9% on 02/05/17   Time 8   Period Weeks   Status On-going     PT LONG TERM GOAL #4   Title Pt. able to return to gym based ex./ running with no  L or R shoulder restrictions (Phase III) to improve return to PLOF.     Baseline No gym based ex. at this time.  Pt. not driving at this time.    Time 8   Period Weeks   Status On-going               Plan - 05/08/17 1658    Clinical Impression Statement Pt. progressing well with R shoulder ROM per MD protocol.  Good R shoulder capsular mobility (no posterior mobs).  B UT/levator muscle tenderness noted in supine and seated posture.     Clinical Presentation Stable   Clinical Decision Making Moderate   Rehab Potential Excellent   PT Frequency 2x / week   PT Duration 4 weeks   PT Treatment/Interventions ADLs/Self Care Home Management;Aquatic Therapy;Cryotherapy;Electrical Stimulation;Therapeutic exercise;Patient/family education;Neuromuscular re-education;Manual techniques;Scar mobilization;Passive range of motion   PT Next Visit Plan Follow MD protocol for R  shoulder.  Progress L shoulder strengthening.   RECERT next tx. session.     PT Home Exercise Plan L shoulder D1, D2, extension, bicep curl (pt provided with HEP2Go handout   Consulted and Agree with Plan of Care Patient      Patient will benefit from skilled therapeutic intervention in order to improve the following deficits and impairments:  Pain, Decreased mobility, Decreased scar mobility, Hypomobility, Decreased strength, Decreased range of motion, Decreased endurance, Decreased activity tolerance, Impaired flexibility  Visit Diagnosis: Acute pain of left shoulder  Shoulder joint stiffness, left  Muscle weakness (generalized)  Acute pain of right shoulder  Shoulder joint stiffness, right     Problem List There are no active problems to display for this patient.  Pura Spice, PT, DPT # [id number] 05/08/2017, 5:04 PM  [facility] [address] [place], [place], [address] Phone: [phone]   Fax:  [phone]  Name: Sara Gomez MRN: [id number] Date of Birth: 02/26/1996

## 2017-05-09 ENCOUNTER — Ambulatory Visit: Payer: BLUE CROSS/BLUE SHIELD | Admitting: Physical Therapy

## 2017-05-09 DIAGNOSIS — M25512 Pain in left shoulder: Secondary | ICD-10-CM

## 2017-05-09 DIAGNOSIS — L700 Acne vulgaris: Secondary | ICD-10-CM | POA: Diagnosis not present

## 2017-05-09 DIAGNOSIS — M6281 Muscle weakness (generalized): Secondary | ICD-10-CM

## 2017-05-09 DIAGNOSIS — M25612 Stiffness of left shoulder, not elsewhere classified: Secondary | ICD-10-CM

## 2017-05-09 DIAGNOSIS — L2089 Other atopic dermatitis: Secondary | ICD-10-CM | POA: Diagnosis not present

## 2017-05-09 DIAGNOSIS — M25611 Stiffness of right shoulder, not elsewhere classified: Secondary | ICD-10-CM

## 2017-05-09 DIAGNOSIS — M25511 Pain in right shoulder: Secondary | ICD-10-CM

## 2017-05-09 NOTE — Therapy (Addendum)
[facility] [address] [place], [place], [address] Phone: [phone]   Fax:  [phone]  Physical Therapy Treatment  Patient Details  Name: Sara Gomez MRN: [id number] Date of Birth: March 10, 1996 Referring Provider: Dr. Jeannie Fend  Encounter Date: 05/09/2017    No past medical history on file.  No past surgical history on file.  There were no vitals filed for this visit.    Treatment: 25 out of 33.   Recert date: 2/72/53    Grip strength: L: 65#             R: 79#     Pt. Reports R shoulder "feels like it has to pop" and has been hurting for a few days.  No L shoulder pain at this time.    TREATMENT   Manual Therapy:  R shoulder PA mobs.   Supine R shoulder AA/PROM: flexion/Abd./ER per MD protocol. X 30  STM to R biceps, UT region, teres major and minor. Significant muscular tightness appreciated in R UT. Cervical traction 3x with static holds.   ?  Therapeutic Exercise:  Standing GTB L shoulder flexion/ scap. Retraction/ bicep curls/ sh. Abduction.    Supine L shoulder isometric F/Abd/ER/IR/E with 10 second holds x 10 each direction- moderate resistance.  Supine L sh. Rhythmic stabs (all planes)- moderate resistance.2x30 seconds, pt demonstrates and reports fatigue with this.  Supine 3# L shoulder press-ups and 2# horiz. Abd. 20x each  Progressed R shoulder HEP/ discussed work-related tasks.       Pt. is progressing well with R shoulder AA/PROM per MD protocol.  Pt. currently pain limited in R UT region with noted tenderness/ trigger point.  Pts. R shoulder presents with full external rotation, 135 deg. flexion and 120 deg. abduction in seated AA/PROM.  L shoulder AROM WNL and strength progressing well.  Pt. reports poor compliance with HEP but works hard during PT tx. session.  Pt. will continue to benefit from skilled PT services to increase R shoulder AROM/ B shoulder stability in order to participate  with household/ work related tasks.           PT Long Term Goals - 05/09/17 6644      PT LONG TERM GOAL #1   Title Pt. I with HEP to increase R shoulder AROM to WNL as compared to L shoulder to improve pain-free mobility.     Baseline No R shoulder AROM at this time.     Time 4   Period Weeks   Status New     PT LONG TERM GOAL #2   Title Pt. will increase R deltoid/ biceps/ triceps strength to grossly 4+/5 MMT to improve return to household tasks/ running.    Baseline progressing well with L shoulder strength.  Pt. has started R sh. scapular/biceps/triceps strengthening.     Time 4   Period Weeks   Status On-going     PT LONG TERM GOAL #3   Title Pt. will decrease QuickDASH to <20% to improve pain-free moblity of L shoulder.     Baseline QuickDASH: 59.1% on 2/26,   40.9% on 02/05/17.   45.5% on 6/27   Time 4   Period Weeks   Status Not Met     PT LONG TERM GOAL #4   Title Pt. able to return to gym based ex./ running with no L or R shoulder restrictions (Phase III) to improve return to PLOF.     Baseline No gym based  ex. at this time.  Pt. has returned to driving.     Time 4   Period Weeks   Status On-going          Patient will benefit from skilled therapeutic intervention in order to improve the following deficits and impairments:  Pain, Decreased mobility, Decreased scar mobility, Hypomobility, Decreased strength, Decreased range of motion, Decreased endurance, Decreased activity tolerance, Impaired flexibility  Visit Diagnosis: Acute pain of left shoulder  Shoulder joint stiffness, left  Muscle weakness (generalized)  Acute pain of right shoulder  Shoulder joint stiffness, right     Problem List There are no active problems to display for this patient.  Pura Spice, PT, DPT # [phone] 05/11/2017, 8:18 AM  [facility] [address] [place], [place], [address] Phone: [phone]   Fax:   [phone]  Name: Kynslei Art Friel MRN: [id number] Date of Birth: Nov 17, 1995

## 2017-05-14 ENCOUNTER — Ambulatory Visit: Payer: BLUE CROSS/BLUE SHIELD | Attending: Orthopedic Surgery | Admitting: Physical Therapy

## 2017-05-14 DIAGNOSIS — M25511 Pain in right shoulder: Secondary | ICD-10-CM | POA: Insufficient documentation

## 2017-05-14 DIAGNOSIS — M25512 Pain in left shoulder: Secondary | ICD-10-CM | POA: Insufficient documentation

## 2017-05-14 DIAGNOSIS — M25611 Stiffness of right shoulder, not elsewhere classified: Secondary | ICD-10-CM | POA: Diagnosis present

## 2017-05-14 DIAGNOSIS — M25612 Stiffness of left shoulder, not elsewhere classified: Secondary | ICD-10-CM | POA: Insufficient documentation

## 2017-05-14 DIAGNOSIS — M6281 Muscle weakness (generalized): Secondary | ICD-10-CM | POA: Diagnosis present

## 2017-05-15 ENCOUNTER — Encounter: Payer: Self-pay | Admitting: Physical Therapy

## 2017-05-15 NOTE — Therapy (Signed)
[facility] [address] [place], [place], [address] Phone: [phone]   Fax:  [phone]  Physical Therapy Treatment  Patient Details  Name: Sara Gomez MRN: [id number] Date of Birth: [birth year] Referring Provider: Dr. Jeannie Fend  Encounter Date: 05/14/2017      PT End of Session - 05/15/17 1448    Visit Number 26   Number of Visits 33   Date for PT Re-Evaluation 06/06/17   PT Start Time 0924   PT Stop Time 1019   PT Time Calculation (min) 55 min   Activity Tolerance Patient tolerated treatment well;Patient limited by pain   Behavior During Therapy [facility] for tasks assessed/performed      History reviewed. No pertinent past medical history.  History reviewed. No pertinent surgical history.  There were no vitals filed for this visit.      Subjective Assessment - 05/15/17 1447    Subjective Pt. states R shoulder is "really hurting" today.     Limitations Lifting;Standing;Writing;House hold activities   Patient Stated Goals Increase L shoulder ROM/ strength to improve pain-free mobility.  R shoulder ROM   Currently in Pain? Yes   Pain Score 5    Pain Location Shoulder   Pain Orientation Right   Pain Descriptors / Indicators Aching   Pain Type Surgical pain   Pain Onset More than a month ago                                      PT Long Term Goals - 05/09/17 7680      PT LONG TERM GOAL #3   Title Pt. will decrease QuickDASH to <20% to improve pain-free moblity of L shoulder.     Baseline QuickDASH: 59.1% on 2/26,   40.9% on 02/05/17.   on 6/27   Time 4   Period Weeks   Status Not Met               Plan - 05/15/17 1449    Clinical Presentation Stable   Clinical Decision Making Moderate   Rehab Potential Excellent   PT Frequency 2x / week   PT Duration 4 weeks   PT Treatment/Interventions ADLs/Self Care Home Management;Aquatic Therapy;Cryotherapy;Electrical  Stimulation;Therapeutic exercise;Patient/family education;Neuromuscular re-education;Manual techniques;Scar mobilization;Passive range of motion   PT Next Visit Plan Follow MD protocol for R shoulder.  Progress L shoulder strengthening.     PT Home Exercise Plan L shoulder D1, D2, extension, bicep curl (pt provided with HEP2Go handout      Patient will benefit from skilled therapeutic intervention in order to improve the following deficits and impairments:  Pain, Decreased mobility, Decreased scar mobility, Hypomobility, Decreased strength, Decreased range of motion, Decreased endurance, Decreased activity tolerance, Impaired flexibility  Visit Diagnosis: Acute pain of left shoulder  Shoulder joint stiffness, left  Muscle weakness (generalized)  Acute pain of right shoulder  Shoulder joint stiffness, right     Problem List There are no active problems to display for this patient.   Pura Spice 05/15/2017, 2:50 PM   [facility] [address] [place], [place], [address] Phone: [phone]   Fax:  [phone]  Name: Sara Gomez MRN: [id number] Date of Birth: 19-Apr-1996

## 2017-05-18 ENCOUNTER — Ambulatory Visit: Payer: BLUE CROSS/BLUE SHIELD | Admitting: Physical Therapy

## 2017-05-18 DIAGNOSIS — M25611 Stiffness of right shoulder, not elsewhere classified: Secondary | ICD-10-CM

## 2017-05-18 DIAGNOSIS — M25612 Stiffness of left shoulder, not elsewhere classified: Secondary | ICD-10-CM

## 2017-05-18 DIAGNOSIS — M25512 Pain in left shoulder: Secondary | ICD-10-CM | POA: Diagnosis not present

## 2017-05-18 DIAGNOSIS — M25511 Pain in right shoulder: Secondary | ICD-10-CM

## 2017-05-18 DIAGNOSIS — M6281 Muscle weakness (generalized): Secondary | ICD-10-CM

## 2017-05-18 NOTE — Therapy (Signed)
[facility] [address] [place], [place], [address] Phone: [phone]   Fax:  [phone]  Physical Therapy Treatment  Patient Details  Name: Sara Gomez MRN: [id number] Date of Birth: Jun 09, 1996 Referring Provider: Dr. Jeannie Fend  Encounter Date: 05/18/2017      PT End of Session - 05/18/17 0833    Visit Number 27   Number of Visits 33   Date for PT Re-Evaluation 06/06/17   PT Start Time 0811   PT Stop Time 0853   PT Time Calculation (min) 42 min   Activity Tolerance Patient tolerated treatment well;Patient limited by pain   Behavior During Therapy [facility] for tasks assessed/performed      No past medical history on file.  No past surgical history on file.  There were no vitals filed for this visit.      Subjective Assessment - 05/18/17 0830    Subjective Pt. doing okay with no new R shoulder complaints.  Pt. able to complete friends make-up yesterday with use of R UE and no limitaitons.  R UT discomfort reported.     Limitations Lifting;Standing;Writing;House hold activities   Patient Stated Goals Increase L shoulder ROM/ strength to improve pain-free mobility.  R shoulder ROM   Currently in Pain? Yes   Pain Score 3    Pain Location Shoulder   Pain Orientation Right   Pain Descriptors / Indicators Aching   Pain Type Surgical pain   Multiple Pain Sites Yes   Pain Score 3   Pain Location Neck   Pain Orientation Right   Pain Descriptors / Indicators Aching   Pain Type Chronic pain   Pain Onset More than a month ago      TREATMENT   Manual Therapy:  R shoulder PA mobs.  Supine R shoulder AA/PROM: flexion (135 deg.)- MD protocol. x30? Supine R shoulder AA/PROM Abd. (120 deg.)- MD protocol. x30  STM to R biceps, UT region, teres major and minor.  Cervical traction 3x with static holds.  ?  Therapeutic Exercise:    Supine R shoulder isometric F/Abd/ER/IR/E with 10 second holds x 10each direction-  moderate resistance. (Issued isometric HEP for R shoulder).   Supine L sh. Rhythmic stabs (all planes)- moderate resistance.2x30 seconds, pt demonstrates and reports fatigue with this.  Supine 3# L shoulder press-ups and horiz. Abd. 20x each  Reviewed HEP.         PT Long Term Goals - 05/09/17 6270      PT LONG TERM GOAL #3   Title Pt. will decrease QuickDASH to <20% to improve pain-free moblity of L shoulder.     Baseline QuickDASH: 59.1% on 2/26,   40.9% on 02/05/17.   on 6/27   Time 4   Period Weeks   Status Not Met             Plan - 05/18/17 3500    Clinical Impression Statement Pt demonstrated ability to tolerate R shoulder isometric exercises in all four planes with no increase in pain.  Pt. limited by R UT/cervical pain and tenderness with palpation and rotn. mvmt.  Pt shows good ROM per MD protocol and is progressing as expected. Pt will continue to benefit from skilled PT to further increase ROM and stability of her R shoulder.   Clinical Presentation Stable   Clinical Decision Making Moderate   Rehab Potential Excellent   PT Frequency 2x / week   PT Duration 4 weeks   PT Treatment/Interventions  ADLs/Self Care Home Management;Aquatic Therapy;Cryotherapy;Electrical Stimulation;Therapeutic exercise;Patient/family education;Neuromuscular re-education;Manual techniques;Scar mobilization;Passive range of motion   PT Next Visit Plan Follow MD protocol for R shoulder.  Progress L shoulder strengthening.     PT Home Exercise Plan L shoulder D1, D2, extension, bicep curl (pt provided with HEP2Go handout   Consulted and Agree with Plan of Care Patient      Patient will benefit from skilled therapeutic intervention in order to improve the following deficits and impairments:  Pain, Decreased mobility, Decreased scar mobility, Hypomobility, Decreased strength, Decreased range of motion, Decreased endurance, Decreased activity tolerance, Impaired flexibility  Visit  Diagnosis: Acute pain of left shoulder  Shoulder joint stiffness, left  Muscle weakness (generalized)  Acute pain of right shoulder  Shoulder joint stiffness, right     Problem List There are no active problems to display for this patient.   Pura Spice 05/18/2017, 3:05 PM  [facility] [address] [place], [place], [address] Phone: [phone]   Fax:  [phone]  Name: Sara Gomez MRN: [id number] Date of Birth: [birth year]

## 2017-05-21 ENCOUNTER — Ambulatory Visit: Payer: BLUE CROSS/BLUE SHIELD | Admitting: Physical Therapy

## 2017-05-21 DIAGNOSIS — M25511 Pain in right shoulder: Secondary | ICD-10-CM

## 2017-05-21 DIAGNOSIS — M25512 Pain in left shoulder: Secondary | ICD-10-CM | POA: Diagnosis not present

## 2017-05-21 DIAGNOSIS — M25611 Stiffness of right shoulder, not elsewhere classified: Secondary | ICD-10-CM

## 2017-05-21 DIAGNOSIS — M6281 Muscle weakness (generalized): Secondary | ICD-10-CM

## 2017-05-21 DIAGNOSIS — M25612 Stiffness of left shoulder, not elsewhere classified: Secondary | ICD-10-CM

## 2017-05-22 NOTE — Therapy (Signed)
[place] [facility] [address] [place], [place], [address] Phone: [phone]   Fax:  [phone]  Physical Therapy Treatment  Patient Details  Name: Sara Gomez MRN: [id number] Date of Birth: 1996-05-19 Referring Provider: Dr. Jeannie Fend  Encounter Date: 05/21/2017      PT End of Session - 05/22/17 1631    Visit Number 28   Number of Visits 33   Date for PT Re-Evaluation 06/06/17   PT Start Time 0921   PT Stop Time 1008   PT Time Calculation (min) 47 min   Activity Tolerance Patient tolerated treatment well;Patient limited by pain   Behavior During Therapy [facility] for tasks assessed/performed      No past medical history on file.  No past surgical history on file.  There were no vitals filed for this visit.      Subjective Assessment - 05/22/17 1630    Limitations Lifting;Standing;Writing;House hold activities   Patient Stated Goals Increase L shoulder ROM/ strength to improve pain-free mobility.  R shoulder ROM   Currently in Pain? Yes   Pain Score 3    Pain Location Shoulder   Pain Orientation Right   Pain Descriptors / Indicators Aching   Pain Type Surgical pain   Pain Score 3   Pain Location Neck   Pain Orientation Right   Pain Descriptors / Indicators Aching   Pain Type Chronic pain                                      PT Long Term Goals - 05/09/17 2426      PT LONG TERM GOAL #3   Title Pt. will decrease QuickDASH to <20% to improve pain-free moblity of L shoulder.     Baseline QuickDASH: 59.1% on 2/26,   40.9% on 02/05/17.   on 6/27   Time 4   Period Weeks   Status Not Met               Plan - 05/21/17 1244    Clinical Impression Statement Pt demonstrated greater tolerance of R shoulder isometric exercises with increased resistance. Pt. limited by R UT/cervical pain but shows improvement in levator scapulae muscle length. Pt. demonstrated tenderness to palpation in  UT/cervical area and states this area is where most of her pain resides. Pt. will continue to benefit from skilled PT to increase AROM and stability of her R shoulder in order to participate in desired activities.   Clinical Presentation Stable   Clinical Decision Making Moderate   Rehab Potential Excellent   PT Frequency 2x / week   PT Duration 4 weeks   PT Treatment/Interventions ADLs/Self Care Home Management;Aquatic Therapy;Cryotherapy;Electrical Stimulation;Therapeutic exercise;Patient/family education;Neuromuscular re-education;Manual techniques;Scar mobilization;Passive range of motion   PT Next Visit Plan Follow MD protocol for R shoulder.  Progress L shoulder strengthening.     PT Home Exercise Plan L shoulder D1, D2, extension, bicep curl (pt provided with HEP2Go handout   Consulted and Agree with Plan of Care Patient      Patient will benefit from skilled therapeutic intervention in order to improve the following deficits and impairments:  Pain, Decreased mobility, Decreased scar mobility, Hypomobility, Decreased strength, Decreased range of motion, Decreased endurance, Decreased activity tolerance, Impaired flexibility  Visit Diagnosis: Acute pain of left shoulder  Shoulder joint stiffness, left  Muscle weakness (generalized)  Acute pain of right shoulder  Shoulder  joint stiffness, right     Problem List There are no active problems to display for this patient.   Pura Spice 05/22/2017, 4:32 PM  [place] [facility] [address] [place], [place], [address] Phone: [phone]   Fax:  [phone]  Name: Sara Gomez MRN: [id number] Date of Birth: 01-06-1996

## 2017-05-23 ENCOUNTER — Ambulatory Visit: Payer: BLUE CROSS/BLUE SHIELD | Admitting: Physical Therapy

## 2017-05-23 ENCOUNTER — Encounter: Payer: Self-pay | Admitting: Physical Therapy

## 2017-05-23 DIAGNOSIS — M25512 Pain in left shoulder: Secondary | ICD-10-CM | POA: Diagnosis not present

## 2017-05-23 DIAGNOSIS — M25612 Stiffness of left shoulder, not elsewhere classified: Secondary | ICD-10-CM

## 2017-05-23 DIAGNOSIS — M6281 Muscle weakness (generalized): Secondary | ICD-10-CM

## 2017-05-23 DIAGNOSIS — M25511 Pain in right shoulder: Secondary | ICD-10-CM

## 2017-05-23 DIAGNOSIS — M25611 Stiffness of right shoulder, not elsewhere classified: Secondary | ICD-10-CM

## 2017-05-24 DIAGNOSIS — L700 Acne vulgaris: Secondary | ICD-10-CM | POA: Diagnosis not present

## 2017-05-24 DIAGNOSIS — L910 Hypertrophic scar: Secondary | ICD-10-CM | POA: Diagnosis not present

## 2017-05-24 DIAGNOSIS — L2089 Other atopic dermatitis: Secondary | ICD-10-CM | POA: Diagnosis not present

## 2017-05-24 NOTE — Therapy (Signed)
[facility] [address] [place], [place], [address] Phone: [phone]   Fax:  [phone]  Physical Therapy Treatment  Patient Details  Name: Sara Gomez MRN: [id number] Date of Birth: September 05, 1996 Referring Provider: Dr. Jeannie Fend  Encounter Date: 05/23/2017      PT End of Session - 05/23/17 1033    Visit Number 29   Number of Visits 33   Date for PT Re-Evaluation 06/06/17   PT Start Time 0929   PT Stop Time 1033   PT Time Calculation (min) 64 min   Activity Tolerance Patient tolerated treatment well;Patient limited by pain   Behavior During Therapy [facility] for tasks assessed/performed      History reviewed. No pertinent past medical history.  History reviewed. No pertinent surgical history.  There were no vitals filed for this visit.      Subjective Assessment - 05/23/17 1029    Subjective Pt. reports to PT with moderate UT/shoulder/posterior neck pain with tenderness to palpation and states her pain is pretty severe today (7/10) and she did not sleep well last night.   Limitations Lifting;Standing;Writing;House hold activities   Patient Stated Goals Increase L shoulder ROM/ strength to improve pain-free mobility.  R shoulder ROM   Currently in Pain? Yes   Pain Score 7    Pain Location Shoulder   Pain Orientation Right   Pain Descriptors / Indicators Aching   Pain Type Surgical pain   Pain Onset More than a month ago   Pain Frequency Intermittent   Multiple Pain Sites Yes   Pain Score 7   Pain Location Neck   Pain Orientation Right   Pain Descriptors / Indicators Aching   Pain Type Chronic pain   Pain Onset More than a month ago   Pain Frequency Intermittent                                 PT Education - 05/23/17 1032    Education provided Yes   Education Details UT stretching technique   Person(s) Educated Patient   Methods Explanation;Demonstration   Comprehension Verbalized  understanding;Returned demonstration             PT Long Term Goals - 05/09/17 0928      PT LONG TERM GOAL #3   Title Pt. will decrease QuickDASH to <20% to improve pain-free moblity of L shoulder.     Baseline QuickDASH: 59.1% on 2/26,   40.9% on 02/05/17.   on 6/27   Time 4   Period Weeks   Status Not Met               Plan - 05/23/17 1034    Rehab Potential Excellent   PT Frequency 2x / week   PT Duration 4 weeks   PT Treatment/Interventions ADLs/Self Care Home Management;Aquatic Therapy;Cryotherapy;Electrical Stimulation;Therapeutic exercise;Patient/family education;Neuromuscular re-education;Manual techniques;Scar mobilization;Passive range of motion   PT Next Visit Plan Follow MD protocol for R shoulder.  Progress L shoulder strengthening.     PT Home Exercise Plan L shoulder D1, D2, extension, bicep curl (pt provided with HEP2Go handout   Consulted and Agree with Plan of Care Patient      Patient will benefit from skilled therapeutic intervention in order to improve the following deficits and impairments:  Pain, Decreased mobility, Decreased scar mobility, Hypomobility, Decreased strength, Decreased range of motion, Decreased endurance, Decreased activity tolerance, Impaired flexibility  Visit  Diagnosis: Acute pain of right shoulder  Shoulder joint stiffness, right  Muscle weakness (generalized)  Shoulder joint stiffness, left     Problem List There are no active problems to display for this patient.   Pura Spice 05/24/2017, 8:23 AM  [facility] [address] [place], [place], [address] Phone: [phone]   Fax:  [phone]  Name: Sara Gomez MRN: [id number] Date of Birth: [birth year]

## 2017-05-28 ENCOUNTER — Ambulatory Visit: Payer: BLUE CROSS/BLUE SHIELD | Admitting: Physical Therapy

## 2017-05-28 NOTE — Addendum Note (Signed)
Addended by: Cammie McgeeSHERK, Agata Lucente C on: 05/28/2017 08:27 AM   Modules accepted: Orders

## 2017-05-30 ENCOUNTER — Encounter: Payer: 59 | Admitting: Physical Therapy

## 2017-05-30 ENCOUNTER — Encounter: Payer: Self-pay | Admitting: Physical Therapy

## 2017-05-30 ENCOUNTER — Ambulatory Visit: Payer: BLUE CROSS/BLUE SHIELD | Admitting: Physical Therapy

## 2017-05-30 DIAGNOSIS — M6281 Muscle weakness (generalized): Secondary | ICD-10-CM

## 2017-05-30 DIAGNOSIS — M25611 Stiffness of right shoulder, not elsewhere classified: Secondary | ICD-10-CM

## 2017-05-30 DIAGNOSIS — M25512 Pain in left shoulder: Secondary | ICD-10-CM | POA: Diagnosis not present

## 2017-05-30 DIAGNOSIS — M25612 Stiffness of left shoulder, not elsewhere classified: Secondary | ICD-10-CM

## 2017-05-30 DIAGNOSIS — M25511 Pain in right shoulder: Secondary | ICD-10-CM

## 2017-05-30 NOTE — Therapy (Addendum)
[facility] [address] [place], [place], [address] Phone: [phone]   Fax:  [phone]  Physical Therapy Treatment  Patient Details  Name: Sara Gomez MRN: [id number] Date of Birth: 21-Sep-1996 Referring Provider: Dr. Jeannie Fend  Encounter Date: 05/30/2017      PT End of Session - 05/30/17 0821    Visit Number 30   Number of Visits 33   Date for PT Re-Evaluation 06/06/17   PT Start Time 0727   PT Stop Time 0822   PT Time Calculation (min) 55 min   Activity Tolerance Patient tolerated treatment well;Patient limited by pain   Behavior During Therapy [facility] for tasks assessed/performed      History reviewed. No pertinent past medical history.  History reviewed. No pertinent surgical history.  There were no vitals filed for this visit.      Subjective Assessment - 05/30/17 0725    Subjective Pt. continues to report persistent R UT muscle tightness/ discomfort.  Pt. reports poor compliance with HEP/ strengthening ex. program.     Limitations Lifting;Standing;Writing;House hold activities   Patient Stated Goals Increase L shoulder ROM/ strength to improve pain-free mobility.  R shoulder ROM   Currently in Pain? Yes   Pain Score 5    Pain Location Shoulder   Pain Orientation Right   Pain Descriptors / Indicators Aching   Pain Type Surgical pain   Pain Onset More than a month ago   Pain Score 7   Pain Location Neck   Pain Orientation Right   Pain Descriptors / Indicators Aching   Pain Type Chronic pain   Pain Onset More than a month ago   Pain Frequency Intermittent      QuickDASH: 29.5%  (marked improvement).      TREATMENT   Manual Therapy:  Supine cervical UT/levator manual stretches 3x each with static holds.   STM to L/R UT with primary focus on R side (+) tenderness.   Supine R shoulder AA/PROM: flexion (135 deg.)- MD protocol. x30? Supine R shoulder AA/PROM Abd. (120 deg.)- MD protocol. x30  STM  to R biceps, UT region, teres major and minor.  Cervical traction 3x with static holds.  ?  Therapeutic Exercise:   Supine R shoulder isometric F/Abd/ER/IR/E with 10 second holds x 10each direction- moderate resistance.   Supine L sh. Rhythmic stabs (all planes)- moderate resistance.2x30 seconds (no issues) Standing R shoulder CW/CCW towel ex. At door with scapular control. Supine L/R shoulder serratus punches with no wt./ focus on relaxing UT musculature.   Reviewed/ discussed HEP in depth.         PT Long Term Goals - 05/09/17 6440      PT LONG TERM GOAL #1   Title Pt. I with HEP to increase R shoulder AROM to WNL as compared to L shoulder to improve pain-free mobility.     Baseline No R shoulder AROM at this time.     Time 4   Period Weeks   Status New     PT LONG TERM GOAL #2   Title Pt. will increase R deltoid/ biceps/ triceps strength to grossly 4+/5 MMT to improve return to household tasks/ running.    Baseline progressing well with L shoulder strength.  Pt. has started R sh. scapular/biceps/triceps strengthening.     Time 4   Period Weeks   Status On-going     PT LONG TERM GOAL #3   Title Pt. will decrease QuickDASH to <20%  to improve pain-free moblity of L shoulder.     Baseline QuickDASH: 59.1% on 2/26,   40.9% on 02/05/17.   45.5% on 6/27   Time 4   Period Weeks   Status Not Met     PT LONG TERM GOAL #4   Title Pt. able to return to gym based ex./ running with no L or R shoulder restrictions (Phase III) to improve return to PLOF.     Baseline No gym based ex. at this time.  Pt. has returned to driving.     Time 4   Period Weeks   Status On-going             Plan - 05/30/17 1125    Clinical Impression Statement (+) R UT trigger point/ muscle tightness noted during any overhead reaching activity/ Lateral flexion of neck to L side.  Pt. doing well with all aspects of L shoulder AROM/ stability (good capsular mobility and stability)- all planes.  R  shoulder limitations at 135 deg. flexion/120 deg. abd. but no pain reported during manual isometric in supine posture.  PT encouraged pt. to work on R shoulder isometrics more independently at home to improve carryover between PT tx. sessions.      Clinical Presentation Stable   Clinical Decision Making Moderate   Rehab Potential Excellent   PT Frequency 2x / week   PT Duration 4 weeks   PT Treatment/Interventions ADLs/Self Care Home Management;Aquatic Therapy;Cryotherapy;Electrical Stimulation;Therapeutic exercise;Patient/family education;Neuromuscular re-education;Manual techniques;Scar mobilization;Passive range of motion   PT Next Visit Plan Follow MD protocol for R shoulder.  Progress L shoulder strengthening.  Discuss MD f/u visit.     PT Home Exercise Plan L shoulder D1, D2, extension, bicep curl (pt provided with HEP2Go handout   Consulted and Agree with Plan of Care Patient      Patient will benefit from skilled therapeutic intervention in order to improve the following deficits and impairments:  Pain, Decreased mobility, Decreased scar mobility, Hypomobility, Decreased strength, Decreased range of motion, Decreased endurance, Decreased activity tolerance, Impaired flexibility  Visit Diagnosis: Acute pain of right shoulder  Shoulder joint stiffness, right  Muscle weakness (generalized)  Shoulder joint stiffness, left  Acute pain of left shoulder     Problem List There are no active problems to display for this patient.  Pura Spice, PT, DPT # [phone] 05/30/2017, 11:34 AM  [place] [facility] [address] [place], [place], [address] Phone: [phone]   Fax:  [phone]  Name: Sara Gomez MRN: [id number] Date of Birth: March 03, 1996

## 2017-06-04 ENCOUNTER — Ambulatory Visit: Payer: BLUE CROSS/BLUE SHIELD | Admitting: Physical Therapy

## 2017-06-04 DIAGNOSIS — M25612 Stiffness of left shoulder, not elsewhere classified: Secondary | ICD-10-CM

## 2017-06-04 DIAGNOSIS — M6281 Muscle weakness (generalized): Secondary | ICD-10-CM

## 2017-06-04 DIAGNOSIS — M25512 Pain in left shoulder: Secondary | ICD-10-CM

## 2017-06-04 DIAGNOSIS — M25511 Pain in right shoulder: Secondary | ICD-10-CM

## 2017-06-04 DIAGNOSIS — M25611 Stiffness of right shoulder, not elsewhere classified: Secondary | ICD-10-CM

## 2017-06-04 NOTE — Therapy (Signed)
[place] [facility] [address] [place], [place], [address] Phone: [phone]   Fax:  [phone]  Physical Therapy Treatment  Patient Details  Name: Sara Gomez MRN: [id number] Date of Birth: 05-Nov-1996 Referring Provider: Dr. Jeannie Fend  Encounter Date: 06/04/2017      PT End of Session - 06/04/17 1036    Visit Number 31   Number of Visits 33   Date for PT Re-Evaluation 06/06/17   PT Start Time 0916   PT Stop Time 1020   PT Time Calculation (min) 64 min   Activity Tolerance Patient tolerated treatment well;Patient limited by pain   Behavior During Therapy [facility] for tasks assessed/performed      No past medical history on file.  No past surgical history on file.  There were no vitals filed for this visit.      Subjective Assessment - 06/04/17 1030    Subjective Pt. states MD appt. is this Friday (7/27) not last Friday.  Pt. really tired this morning and c/o R neck/UT discomfort.     Limitations Lifting;Standing;Writing;House hold activities   Patient Stated Goals Increase L shoulder ROM/ strength to improve pain-free mobility.  R shoulder ROM   Currently in Pain? Yes   Pain Score 3    Pain Location Shoulder   Pain Orientation Right   Pain Descriptors / Indicators Aching   Aggravating Factors  R UT tightness/ discomfort.        TREATMENT   Manual Therapy:  Supine cervical UT/levator manual stretches 3x each with static holds.   STM to L/R UT with primary focus on R side (+) tenderness.   STM to R biceps, UT region, teres major and minor. Cervical traction 3x with static holds.  Prone grade II-III PA mobs. To C3-T2 1x20 sec. Each (central/unilateral).  STM to B UT region.   ?  Therapeutic Exercise:   Supine Rshoulder isometric F/Abd/ER/IR/E with 10 second holds x 10each direction- moderate resistance.  Standing B shoulder ball ex. (AAROM) on wall with walking in hallway 2x. Supine B shoulder wand (2.5#)  press-ups/ abd./ ER/ flexion 20x each. Nautilus: 20# lat. Pull downs/ tricep ext./ scap. Retraction/ press outs with wand 20x each.            PT Long Term Goals - 05/09/17 0272      PT LONG TERM GOAL #1   Title Pt. I with HEP to increase R shoulder AROM to WNL as compared to L shoulder to improve pain-free mobility.     Baseline No R shoulder AROM at this time.     Time 4   Period Weeks   Status New     PT LONG TERM GOAL #2   Title Pt. will increase R deltoid/ biceps/ triceps strength to grossly 4+/5 MMT to improve return to household tasks/ running.    Baseline progressing well with L shoulder strength.  Pt. has started R sh. scapular/biceps/triceps strengthening.     Time 4   Period Weeks   Status On-going     PT LONG TERM GOAL #3   Title Pt. will decrease QuickDASH to <20% to improve pain-free moblity of L shoulder.     Baseline QuickDASH: 59.1% on 2/26,   40.9% on 02/05/17.   45.5% on 6/27   Time 4   Period Weeks   Status Not Met     PT LONG TERM GOAL #4   Title Pt. able to return to gym based ex./ running with  no L or R shoulder restrictions (Phase III) to improve return to PLOF.     Baseline No gym based ex. at this time.  Pt. has returned to driving.     Time 4   Period Weeks   Status On-going             Plan - 06/04/17 1037    Clinical Impression Statement R UT/neck discomfort reported prior and during ther.ex. program.  Good cervical AROM with increase discomfort reported at end-range rotn/ lateral flexion movement patterns.  Pt. progressing well with L shoulder strengthening and R shoulder isometrics with addition of Nautilus (light wt.) ex. today.     Clinical Presentation Stable   Clinical Decision Making Moderate   Rehab Potential Excellent   PT Frequency 2x / week   PT Duration 4 weeks   PT Treatment/Interventions ADLs/Self Care Home Management;Aquatic Therapy;Cryotherapy;Electrical Stimulation;Therapeutic exercise;Patient/family  education;Neuromuscular re-education;Manual techniques;Scar mobilization;Passive range of motion   PT Next Visit Plan Follow MD protocol for R shoulder.  Progress L shoulder strengthening.  MD f/u on Friday 7/27.  Update goals/ recert next tx.     PT Home Exercise Plan L shoulder D1, D2, extension, bicep curl (pt provided with HEP2Go handout      Patient will benefit from skilled therapeutic intervention in order to improve the following deficits and impairments:  Pain, Decreased mobility, Decreased scar mobility, Hypomobility, Decreased strength, Decreased range of motion, Decreased endurance, Decreased activity tolerance, Impaired flexibility  Visit Diagnosis: Acute pain of right shoulder  Shoulder joint stiffness, right  Muscle weakness (generalized)  Shoulder joint stiffness, left  Acute pain of left shoulder     Problem List There are no active problems to display for this patient.  Pura Spice, PT, DPT # [phone] 06/04/2017, 10:56 AM  [place] [facility] [address] [place], [place], [address] Phone: [phone]   Fax:  [phone]  Name: Eldred Lievanos Zavalza MRN: [id number] Date of Birth: October 29, 1996

## 2017-06-06 ENCOUNTER — Encounter: Payer: Self-pay | Admitting: Physical Therapy

## 2017-06-06 ENCOUNTER — Ambulatory Visit: Payer: BLUE CROSS/BLUE SHIELD | Admitting: Physical Therapy

## 2017-06-06 DIAGNOSIS — M6281 Muscle weakness (generalized): Secondary | ICD-10-CM

## 2017-06-06 DIAGNOSIS — M25611 Stiffness of right shoulder, not elsewhere classified: Secondary | ICD-10-CM

## 2017-06-06 DIAGNOSIS — M25512 Pain in left shoulder: Secondary | ICD-10-CM

## 2017-06-06 DIAGNOSIS — M25511 Pain in right shoulder: Secondary | ICD-10-CM

## 2017-06-06 DIAGNOSIS — M25612 Stiffness of left shoulder, not elsewhere classified: Secondary | ICD-10-CM

## 2017-06-07 NOTE — Therapy (Signed)
[place] [facility] [address] [place], [place], [address] Phone: [phone]   Fax:  [phone]  Physical Therapy Treatment  Patient Details  Name: Sara Gomez MRN: [id number] Date of Birth: 1996/10/09 Referring Provider: Dr. Jeannie Fend  Encounter Date: 06/06/2017      PT End of Session - 06/07/17 1630    Visit Number 32   Number of Visits 33   Date for PT Re-Evaluation 06/06/17   PT Start Time 0945   PT Stop Time 4008   PT Time Calculation (min) 54 min   Activity Tolerance Patient tolerated treatment well;Patient limited by pain   Behavior During Therapy [facility] for tasks assessed/performed      History reviewed. No pertinent past medical history.  History reviewed. No pertinent surgical history.  There were no vitals filed for this visit.      Subjective Assessment - 06/06/17 0938    Limitations (P)  Lifting;Standing;Writing;House hold activities   Patient Stated Goals (P)  Increase L shoulder ROM/ strength to improve pain-free mobility.  R shoulder ROM   Currently in Pain? (P)  Yes                                      PT Long Term Goals - 05/09/17 6761      PT LONG TERM GOAL #1   Title Pt. I with HEP to increase R shoulder AROM to WNL as compared to L shoulder to improve pain-free mobility.     Baseline No R shoulder AROM at this time.     Time 4   Period Weeks   Status New     PT LONG TERM GOAL #2   Title Pt. will increase R deltoid/ biceps/ triceps strength to grossly 4+/5 MMT to improve return to household tasks/ running.    Baseline progressing well with L shoulder strength.  Pt. has started R sh. scapular/biceps/triceps strengthening.     Time 4   Period Weeks   Status On-going     PT LONG TERM GOAL #3   Title Pt. will decrease QuickDASH to <20% to improve pain-free moblity of L shoulder.     Baseline QuickDASH: 59.1% on 2/26,   40.9% on 02/05/17.   45.5% on 6/27   Time 4   Period Weeks   Status Not Met     PT LONG TERM GOAL #4   Title Pt. able to return to gym based ex./ running with no L or R shoulder restrictions (Phase III) to improve return to PLOF.     Baseline No gym based ex. at this time.  Pt. has returned to driving.     Time 4   Period Weeks   Status On-going               Plan - 06/07/17 1631    Clinical Presentation Stable   Clinical Decision Making Moderate   Rehab Potential Excellent   PT Frequency 2x / week   PT Duration 4 weeks   PT Treatment/Interventions ADLs/Self Care Home Management;Aquatic Therapy;Cryotherapy;Electrical Stimulation;Therapeutic exercise;Patient/family education;Neuromuscular re-education;Manual techniques;Scar mobilization;Passive range of motion   PT Next Visit Plan Follow MD protocol for R shoulder.  Progress L shoulder strengthening.  MD f/u on Friday 7/27.  Update goals/ recert next tx.     PT Home Exercise Plan L shoulder D1, D2, extension, bicep curl (pt provided with HEP2Go handout  Consulted and Agree with Plan of Care Patient      Patient will benefit from skilled therapeutic intervention in order to improve the following deficits and impairments:  Pain, Decreased mobility, Decreased scar mobility, Hypomobility, Decreased strength, Decreased range of motion, Decreased endurance, Decreased activity tolerance, Impaired flexibility  Visit Diagnosis: Acute pain of right shoulder  Shoulder joint stiffness, right  Muscle weakness (generalized)  Shoulder joint stiffness, left  Acute pain of left shoulder     Problem List There are no active problems to display for this patient.   Pura Spice 06/07/2017, 4:32 PM  [place] [facility] [address] [place], [place], [address] Phone: [phone]   Fax:  [phone]  Name: Sara Gomez MRN: [id number] Date of Birth: Jan 09, 1996

## 2017-06-11 ENCOUNTER — Ambulatory Visit: Payer: BLUE CROSS/BLUE SHIELD | Admitting: Physical Therapy

## 2017-06-11 DIAGNOSIS — M25512 Pain in left shoulder: Secondary | ICD-10-CM | POA: Diagnosis not present

## 2017-06-11 DIAGNOSIS — M6281 Muscle weakness (generalized): Secondary | ICD-10-CM

## 2017-06-11 DIAGNOSIS — M25511 Pain in right shoulder: Secondary | ICD-10-CM

## 2017-06-11 DIAGNOSIS — M25611 Stiffness of right shoulder, not elsewhere classified: Secondary | ICD-10-CM

## 2017-06-11 DIAGNOSIS — M25612 Stiffness of left shoulder, not elsewhere classified: Secondary | ICD-10-CM

## 2017-06-12 NOTE — Therapy (Signed)
[place] [facility] [address] [place], [place], [address] Phone: [phone]   Fax:  [phone]  Physical Therapy Treatment  Patient Details  Name: Sara Gomez MRN: [id number] Date of Birth: [birth year] Referring Provider: Dr. Jeannie Fend  Encounter Date: 06/11/2017      PT End of Session - 06/12/17 1708    Visit Number 33   Number of Visits 37   Date for PT Re-Evaluation 07/09/17   PT Start Time 0932   PT Stop Time 1019   PT Time Calculation (min) 47 min   Activity Tolerance Patient tolerated treatment well;Patient limited by pain   Behavior During Therapy [facility] for tasks assessed/performed      No past medical history on file.  No past surgical history on file.  There were no vitals filed for this visit.      Subjective Assessment - 06/12/17 1432    Subjective Pt. states MD f/u went well.     Limitations Lifting;Standing;Writing;House hold activities   Patient Stated Goals Increase L shoulder ROM/ strength to improve pain-free mobility.  R shoulder ROM   Currently in Pain? Yes   Pain Score 3    Pain Location Shoulder   Pain Orientation Right   Pain Descriptors / Indicators Aching   Pain Type Surgical pain                                      PT Long Term Goals - 06/12/17 1709      PT LONG TERM GOAL #1   Title Pt. I with HEP to increase R shoulder AROM to WNL as compared to L shoulder to improve pain-free mobility.     Baseline R shoulder AROM WNL   Time 4   Period Weeks   Status Achieved     PT LONG TERM GOAL #2   Title Pt. will increase R deltoid/ biceps/ triceps strength to grossly 4+/5 MMT to improve return to household tasks/ running.    Baseline R shoulder strength grossly 4/5 MMT (pain limited).     Time 4   Period Weeks   Status Partially Met     PT LONG TERM GOAL #3   Title Pt. will decrease QuickDASH to <20% to improve pain-free moblity of L shoulder.     Baseline  QuickDASH: 59.1% on 2/26,   40.9% on 02/05/17.   45.5% on 6/27   Time 4   Period Weeks   Status On-going     PT LONG TERM GOAL #4   Title Pt. able to return to gym based ex./ running with no L or R shoulder restrictions (Phase III) to improve return to PLOF.     Baseline Pt. has recently returned to PF over past week.     Time 4   Period Weeks   Status Partially Met             Patient will benefit from skilled therapeutic intervention in order to improve the following deficits and impairments:     Visit Diagnosis: Acute pain of right shoulder  Shoulder joint stiffness, right  Muscle weakness (generalized)  Shoulder joint stiffness, left  Acute pain of left shoulder     Problem List There are no active problems to display for this patient.   Pura Spice 06/12/2017, 5:11 PM  [place] [facility] [address]  [place], [place], [address] Phone: [phone]   Fax:  [phone]  Name: Sara Gomez MRN: [id number] Date of Birth: [birth year]

## 2017-06-13 ENCOUNTER — Ambulatory Visit: Payer: BLUE CROSS/BLUE SHIELD | Attending: Orthopedic Surgery | Admitting: Physical Therapy

## 2017-06-13 DIAGNOSIS — M25511 Pain in right shoulder: Secondary | ICD-10-CM | POA: Diagnosis not present

## 2017-06-13 DIAGNOSIS — M6281 Muscle weakness (generalized): Secondary | ICD-10-CM | POA: Diagnosis present

## 2017-06-13 DIAGNOSIS — M25512 Pain in left shoulder: Secondary | ICD-10-CM | POA: Diagnosis present

## 2017-06-13 DIAGNOSIS — M25612 Stiffness of left shoulder, not elsewhere classified: Secondary | ICD-10-CM | POA: Diagnosis present

## 2017-06-13 DIAGNOSIS — M25611 Stiffness of right shoulder, not elsewhere classified: Secondary | ICD-10-CM | POA: Diagnosis present

## 2017-06-14 NOTE — Therapy (Signed)
[place] [facility] [address] [place], [place], [address] Phone: [phone]   Fax:  [phone]  Physical Therapy Treatment  Patient Details  Name: Sara Gomez MRN: [id number] Date of Birth: Feb 01, 1996 Referring Provider: Dr. Jeannie Fend  Encounter Date: 06/13/2017      PT End of Session - 06/14/17 1729    Visit Number 34   Number of Visits 37   Date for PT Re-Evaluation 07/09/17   PT Start Time 0935   PT Stop Time 1025   PT Time Calculation (min) 50 min   Activity Tolerance Patient tolerated treatment well;Patient limited by pain   Behavior During Therapy [facility] for tasks assessed/performed      No past medical history on file.  No past surgical history on file.  There were no vitals filed for this visit.                                    PT Long Term Goals - 06/12/17 1709      PT LONG TERM GOAL #1   Title Pt. I with HEP to increase R shoulder AROM to WNL as compared to L shoulder to improve pain-free mobility.     Baseline R shoulder AROM WNL   Time 4   Period Weeks   Status Achieved     PT LONG TERM GOAL #2   Title Pt. will increase R deltoid/ biceps/ triceps strength to grossly 4+/5 MMT to improve return to household tasks/ running.    Baseline R shoulder strength grossly 4/5 MMT (pain limited).     Time 4   Period Weeks   Status Partially Met     PT LONG TERM GOAL #3   Title Pt. will decrease QuickDASH to <20% to improve pain-free moblity of L shoulder.     Baseline QuickDASH: 59.1% on 2/26,   40.9% on 02/05/17.   45.5% on 6/27   Time 4   Period Weeks   Status On-going     PT LONG TERM GOAL #4   Title Pt. able to return to gym based ex./ running with no L or R shoulder restrictions (Phase III) to improve return to PLOF.     Baseline Pt. has recently returned to PF over past week.     Time 4   Period Weeks   Status Partially Met             Patient will benefit from  skilled therapeutic intervention in order to improve the following deficits and impairments:     Visit Diagnosis: Acute pain of right shoulder  Shoulder joint stiffness, right  Muscle weakness (generalized)  Shoulder joint stiffness, left  Acute pain of left shoulder     Problem List There are no active problems to display for this patient.   Pura Spice 06/14/2017, 5:30 PM  [place] [facility] [address] [place], [place], [address] Phone: [phone]   Fax:  [phone]  Name: Sara Gomez MRN: [id number] Date of Birth: July 12, 1996

## 2017-06-18 ENCOUNTER — Ambulatory Visit: Payer: BLUE CROSS/BLUE SHIELD | Admitting: Physical Therapy

## 2017-06-18 DIAGNOSIS — M25511 Pain in right shoulder: Secondary | ICD-10-CM

## 2017-06-18 DIAGNOSIS — M25611 Stiffness of right shoulder, not elsewhere classified: Secondary | ICD-10-CM

## 2017-06-18 DIAGNOSIS — M25612 Stiffness of left shoulder, not elsewhere classified: Secondary | ICD-10-CM

## 2017-06-18 DIAGNOSIS — M25512 Pain in left shoulder: Secondary | ICD-10-CM

## 2017-06-18 DIAGNOSIS — M6281 Muscle weakness (generalized): Secondary | ICD-10-CM

## 2017-06-20 NOTE — Therapy (Signed)
[place] [facility] [address] [place], [place], [address] Phone: [phone]   Fax:  [phone]  Physical Therapy Treatment  Patient Details  Name: Sara Gomez MRN: [id number] Date of Birth: [birth year] Referring Provider: Dr. Jeannie Fend  Encounter Date: 06/18/2017      PT End of Session - 06/19/17 1714    Visit Number 35   Number of Visits 37   Date for PT Re-Evaluation 07/09/17   PT Start Time 0931   PT Stop Time 1025   PT Time Calculation (min) 54 min   Activity Tolerance Patient tolerated treatment well   Behavior During Therapy [facility] for tasks assessed/performed      No past medical history on file.  No past surgical history on file.  There were no vitals filed for this visit.                                    PT Long Term Goals - 06/12/17 1709      PT LONG TERM GOAL #1   Title Pt. I with HEP to increase R shoulder AROM to WNL as compared to L shoulder to improve pain-free mobility.     Baseline R shoulder AROM WNL   Time 4   Period Weeks   Status Achieved     PT LONG TERM GOAL #2   Title Pt. will increase R deltoid/ biceps/ triceps strength to grossly 4+/5 MMT to improve return to household tasks/ running.    Baseline R shoulder strength grossly 4/5 MMT (pain limited).     Time 4   Period Weeks   Status Partially Met     PT LONG TERM GOAL #3   Title Pt. will decrease QuickDASH to <20% to improve pain-free moblity of L shoulder.     Baseline QuickDASH: 59.1% on 2/26,   40.9% on 02/05/17.   45.5% on 6/27   Time 4   Period Weeks   Status On-going     PT LONG TERM GOAL #4   Title Pt. able to return to gym based ex./ running with no L or R shoulder restrictions (Phase III) to improve return to PLOF.     Baseline Pt. has recently returned to PF over past week.     Time 4   Period Weeks   Status Partially Met             Patient will benefit from skilled therapeutic  intervention in order to improve the following deficits and impairments:     Visit Diagnosis: Acute pain of right shoulder  Shoulder joint stiffness, right  Muscle weakness (generalized)  Shoulder joint stiffness, left  Acute pain of left shoulder     Problem List There are no active problems to display for this patient.   Pura Spice 06/20/2017, 7:19 AM   [facility] [place] [address] [place], [place], [address] Phone: [phone]   Fax:  [phone]  Name: Sara Gomez MRN: [id number] Date of Birth: 1996/01/23

## 2017-06-25 ENCOUNTER — Ambulatory Visit: Payer: BLUE CROSS/BLUE SHIELD | Admitting: Physical Therapy

## 2017-06-25 DIAGNOSIS — M25612 Stiffness of left shoulder, not elsewhere classified: Secondary | ICD-10-CM

## 2017-06-25 DIAGNOSIS — M25611 Stiffness of right shoulder, not elsewhere classified: Secondary | ICD-10-CM

## 2017-06-25 DIAGNOSIS — M25511 Pain in right shoulder: Secondary | ICD-10-CM

## 2017-06-25 DIAGNOSIS — M6281 Muscle weakness (generalized): Secondary | ICD-10-CM

## 2017-06-25 DIAGNOSIS — M25512 Pain in left shoulder: Secondary | ICD-10-CM

## 2017-06-26 ENCOUNTER — Encounter: Payer: Self-pay | Admitting: Physical Therapy

## 2017-06-26 NOTE — Therapy (Signed)
[place] [facility] [address] [place], [place], [address] Phone: [phone]   Fax:  [phone]  Physical Therapy Treatment  Patient Details  Name: Sara Gomez MRN: [id number] Date of Birth: 12/14/1995 Referring Provider: Dr. Roney Mansreighton  Encounter Date: 06/25/2017      PT End of Session - 06/26/17 1641    Visit Number 36   Number of Visits 37   Date for PT Re-Evaluation 07/09/17   PT Start Time 0905   PT Stop Time 1004   PT Time Calculation (min) 59 min   Activity Tolerance Patient tolerated treatment well   Behavior [facility] Therapy [facility] for tasks assessed/performed      History reviewed. No pertinent past medical history.  History reviewed. No pertinent surgical history.  There were no vitals filed for this visit.                                    PT Long Term Goals - 06/26/17 1642      PT LONG TERM GOAL #1   Title Pt. I with HEP to increase R shoulder AROM to WNL as compared to L shoulder to improve pain-free mobility.     Baseline R shoulder AROM WNL   Time 4   Period Weeks   Status Achieved     PT LONG TERM GOAL #2   Title Pt. will increase R deltoid/ biceps/ triceps strength to grossly 4+/5 MMT to improve return to household tasks/ running.    Baseline 4+/5 MMT  (Pt instructed pt. to be more compliant with HEP/ strengthening ex.).     Time 4   Period Weeks   Status Achieved     PT LONG TERM GOAL #3   Title Pt. will decrease QuickDASH to <20% to improve pain-free moblity of L shoulder.     Baseline QuickDASH: 59.1% on 2/26,   40.9% on 02/05/17.   45.5% on 6/27   Time 4   Period Weeks   Status Unable to assess     PT LONG TERM GOAL #4   Title Pt. able to return to gym based ex./ running with no L or R shoulder restrictions (Phase III) to improve return to PLOF.     Baseline Pt. has recently returned to PF over past week.     Time 4   Period Weeks   Status Achieved              Patient will benefit from skilled therapeutic intervention in order to improve the following deficits and impairments:     Visit Diagnosis: Acute pain of right shoulder  Shoulder joint stiffness, right  Muscle weakness (generalized)  Shoulder joint stiffness, left  Acute pain of left shoulder     Problem List There are no active problems to display for this patient.   Cammie McgeeSherk, Hartman Minahan C 06/26/2017, 4:43 PM  [place] [facility] [address] [place], [place], [address] Phone: [phone]   Fax:  [phone]  Name: Sara Gomez MRN: [id number] Date of Birth: 07/07/1996

## 2017-07-03 DIAGNOSIS — L700 Acne vulgaris: Secondary | ICD-10-CM | POA: Diagnosis not present

## 2017-07-03 DIAGNOSIS — L910 Hypertrophic scar: Secondary | ICD-10-CM | POA: Diagnosis not present

## 2017-07-26 DIAGNOSIS — L910 Hypertrophic scar: Secondary | ICD-10-CM | POA: Diagnosis not present

## 2017-08-30 DIAGNOSIS — L910 Hypertrophic scar: Secondary | ICD-10-CM | POA: Diagnosis not present

## 2017-08-30 DIAGNOSIS — L700 Acne vulgaris: Secondary | ICD-10-CM | POA: Diagnosis not present

## 2017-09-24 ENCOUNTER — Encounter (INDEPENDENT_AMBULATORY_CARE_PROVIDER_SITE_OTHER): Payer: Self-pay

## 2017-09-24 ENCOUNTER — Encounter: Payer: Self-pay | Admitting: Gastroenterology

## 2017-09-24 ENCOUNTER — Ambulatory Visit (INDEPENDENT_AMBULATORY_CARE_PROVIDER_SITE_OTHER): Payer: BLUE CROSS/BLUE SHIELD | Admitting: Gastroenterology

## 2017-09-24 VITALS — BP 123/78 | HR 93 | Temp 98.2°F | Wt 142.6 lb

## 2017-09-24 DIAGNOSIS — R1013 Epigastric pain: Secondary | ICD-10-CM

## 2017-09-24 NOTE — Progress Notes (Signed)
Sara BouillonVarnita Oshae Simmering, MD [address], [address], [place], [place], [address] [address], [place], [place], [address] Phone: [phone]  Fax: [phone]  Consultation  Referring Provider:     Herb GraysBoylston, Yun, MD Primary Care Physician:  Herb GraysBoylston, Yun, MD Primary Gastroenterologist:  Pasty SpillersVarnita B Ameisha Mcclellan, MD        Reason for Consultation:     Dyspepsia  Date of Consultation:  09/24/2017         HPI:   Sara Gomez is a 21 y.o. female referred for midepigastric abdominal pain intermittently for a year, cramping, occurs immediately after eating, 5/10 with no weight loss. Pt. Has hx of depression and is on multiple meds and is also recently needing multiple shoulder surgeries requiring NSAIDs and narcotics. She has taken omeprazole before that has not helped. No dysphagia, hematemesis, blood in stool. Reports constipation at times. Does not take daily bowel regimen. No family hx of GI cancers  No past medical history on file. Depression  No past surgical history on file.  Prior to Admission medications   Medication Sig Start Date End Date Taking? Authorizing Provider  aspirin 81 MG chewable tablet Chew 81 mg by mouth. 11/28/16  Yes [provider]  gabapentin (NEURONTIN) 100 MG capsule Take 100 mg by mouth. 03/15/17  Yes [provider]  lidocaine (XYLOCAINE) 2 % solution  01/30/17  Yes [provider]  naproxen sodium (ANAPROX) 550 MG tablet TAKE 1 TAB BY MOUTH EVERY 12 HOURS 09/03/15  Yes [provider]  sertraline (ZOLOFT) 100 MG tablet  02/28/17  Yes [provider]  Adapalene 0.3 % gel APPLY TO AFFECTED AREA AT BEDTIME 08/31/17   [provider]  buPROPion (WELLBUTRIN XL) 150 MG 24 hr tablet TAKE 1 TAB BY MOUTH EVERY MORNING FOR 90 DAYS 09/07/17   [provider]  ibuprofen (ADVIL,MOTRIN) 200 MG tablet Take 400 mg by mouth.    [provider]  methylphenidate 36 MG PO CR tablet Take by mouth.     [provider]  ondansetron (ZOFRAN-ODT) 8 MG disintegrating tablet TAKE 1 TAB BY MOUTH EVERY 8 HOURS AS NEEDED FOR NAUSEA AND VOMITING 09/17/17   [provider]  silver sulfADIAZINE (SILVADENE) 1 % cream APPLY TO THE AFFECTED AREAS TWICE DAILY FOR 7 DAYS 09/22/17   [provider]  SPRINTEC 28 0.25-35 MG-MCG tablet Take 1 tablet daily by mouth. 07/12/17   [provider]  SUMAtriptan (IMITREX) 100 MG tablet TAKE 1/2 TAB BY MOUTH AT ONSET OF HEADACHE MAX. DAILY DOSE: 1 TAB 09/11/17   [provider]  tretinoin (RETIN-A) 0.025 % cream 1(ONE) APPLICATION(S) TOPICAL EVERY NIGHT AT BEDTIME 07/03/17   [provider]    No family history on file. No pertinent family hx  Social History   Tobacco Use  . Smoking status: Never Smoker  . Smokeless tobacco: Never Used  Substance Use Topics  . Alcohol use: No    Frequency: Never  . Drug use: No    Allergies as of 09/24/2017 - Review Complete 09/24/2017  Allergen Reaction Noted  . Oxycodone Nausea And Vomiting 03/15/2017  . Cat hair extract  06/05/2014    Review of Systems:    All systems reviewed and negative except where noted in HPI.   Physical Exam:  Vital signs in last 24 hours: Vitals reviewed   General:   Pleasant, cooperative in NAD Head:  Normocephalic and atraumatic. Eyes:   No icterus.   Conjunctiva pink. PERRLA.  Ears:  Normal auditory acuity. Neck:  Supple; no masses or thyroidomegaly Lungs: Respirations even and unlabored. Lungs clear to auscultation bilaterally.   No wheezes, crackles, or rhonchi.  Heart:  Regular rate and rhythm;  Without murmur, clicks, rubs or gallops Abdomen:  Soft, nondistended, nontender (deep palpation with stethoscope did not elicit any pain in all quadrants, palpation with hand lead to mild pain in the midepigastric region). Normal bowel sounds. No appreciable masses or hepatomegaly.  No rebound or guarding.  Neurologic:  Alert and oriented x3;   grossly normal neurologically. Skin:  Intact without significant lesions or rashes. Cervical Nodes:  No significant cervical adenopathy. Psych:  Alert and cooperative. Normal affect.  LAB RESULTS: No results for input(s): WBC, HGB, HCT, PLT in the last 72 hours. BMET No results for input(s): NA, K, CL, CO2, GLUCOSE, BUN, CREATININE, CALCIUM in the last 72 hours. LFT No results for input(s): PROT, ALBUMIN, AST, ALT, ALKPHOS, BILITOT, BILIDIR, IBILI in the last 72 hours. PT/INR No results for input(s): LABPROT, INR in the last 72 hours.   CBC reviewed - pt. Has [facility] results from last week with her and was normal. CMP was normal except for Na of 145 and creat 1.02. Normal electrolytes and liver enzymes otherwise  STUDIES: No results found.    Impression / Plan:   [name] is a 21 y.o. y/o female with dyspepsia with no alarm symptoms, hx of depression, and recent NSAID and narcotic use  Dyspepsia -Will obtain stool for H Pylori, pt asked to get this done at the end of the week as that will be 2 weeks from her last Omeprazole use (only using it intermittently) -No alarm symptoms present, no indication for EGD at this time -Pt asked to resume omeprazole after providing stool sample. She is taking multiple NSAIDs and will likely need more of them next week when she has her shoulder surgery. Goal is to use this over a short term. The risks of PPI including C. Diff diarrhea, pneumonia, kidney injury were discussed and she verbalized understanding.  -Pt. Was asked to work with her PCP to minimize her medications longterm as she is multiple meds and is very young -Will obtain Abdominal Xray  -Pt asked to inform us of any alarm symptoms if they develop and she verbalized understanding.  -Can also be functional abdominal pain. Can be related to her anxiety and depression. I discussed this with her too and she verbalized understanding. Would recommend PCP to minimize her meds if possible  long term.   Constipation -Can be contributing to abdominal discomfort -Start miralax daily with goal of 1-2 soft BM a day. Pt instructed to titrate it to goal   Thank you for involving me in the care of this patient.     Pasty SpillersVarnita B Suzie Vandam, MD  09/24/2017, 3:26 PM

## 2017-09-25 ENCOUNTER — Ambulatory Visit
Admission: RE | Admit: 2017-09-25 | Discharge: 2017-09-25 | Disposition: A | Discharge status: Home / Self Care | Payer: BLUE CROSS/BLUE SHIELD | Source: Ambulatory Visit | Attending: Gastroenterology | Admitting: Gastroenterology

## 2017-09-25 ENCOUNTER — Encounter: Payer: Self-pay | Admitting: Radiology

## 2017-09-25 ENCOUNTER — Other Ambulatory Visit
Admission: RE | Admit: 2017-09-25 | Discharge: 2017-09-25 | Disposition: A | Discharge status: Home / Self Care | Payer: BLUE CROSS/BLUE SHIELD | Source: Ambulatory Visit | Attending: Gastroenterology | Admitting: Gastroenterology

## 2017-09-25 DIAGNOSIS — R1013 Epigastric pain: Secondary | ICD-10-CM | POA: Diagnosis not present

## 2017-09-26 ENCOUNTER — Encounter: Payer: Self-pay | Admitting: Gastroenterology

## 2017-09-28 ENCOUNTER — Other Ambulatory Visit
Admission: RE | Admit: 2017-09-28 | Discharge: 2017-09-28 | Disposition: A | Discharge status: Home / Self Care | Payer: BLUE CROSS/BLUE SHIELD | Source: Ambulatory Visit | Attending: Gastroenterology | Admitting: Gastroenterology

## 2017-09-28 DIAGNOSIS — R1013 Epigastric pain: Secondary | ICD-10-CM | POA: Insufficient documentation

## 2017-09-29 LAB — H. PYLORI ANTIGEN, STOOL: H. PYLORI STOOL AG, EIA: NEGATIVE

## 2017-10-02 DIAGNOSIS — M25312 Other instability, left shoulder: Secondary | ICD-10-CM | POA: Diagnosis not present

## 2017-10-02 DIAGNOSIS — G8918 Other acute postprocedural pain: Secondary | ICD-10-CM | POA: Diagnosis not present

## 2017-10-08 ENCOUNTER — Encounter: Payer: Self-pay | Admitting: Gastroenterology

## 2017-10-23 ENCOUNTER — Ambulatory Visit: Payer: 59 | Admitting: Physical Therapy

## 2017-10-25 ENCOUNTER — Encounter: Payer: 59 | Admitting: Physical Therapy

## 2017-10-25 ENCOUNTER — Ambulatory Visit: Payer: BLUE CROSS/BLUE SHIELD | Attending: Orthopedic Surgery | Admitting: Physical Therapy

## 2017-10-25 DIAGNOSIS — M25511 Pain in right shoulder: Secondary | ICD-10-CM | POA: Insufficient documentation

## 2017-10-25 DIAGNOSIS — M25611 Stiffness of right shoulder, not elsewhere classified: Secondary | ICD-10-CM | POA: Diagnosis present

## 2017-10-25 DIAGNOSIS — M6281 Muscle weakness (generalized): Secondary | ICD-10-CM | POA: Diagnosis present

## 2017-10-25 DIAGNOSIS — M25612 Stiffness of left shoulder, not elsewhere classified: Secondary | ICD-10-CM | POA: Insufficient documentation

## 2017-10-25 DIAGNOSIS — M25512 Pain in left shoulder: Secondary | ICD-10-CM | POA: Diagnosis present

## 2017-10-30 ENCOUNTER — Ambulatory Visit: Payer: BLUE CROSS/BLUE SHIELD | Admitting: Physical Therapy

## 2017-10-30 ENCOUNTER — Encounter: Payer: Self-pay | Admitting: Physical Therapy

## 2017-10-30 ENCOUNTER — Other Ambulatory Visit: Payer: Self-pay

## 2017-10-30 DIAGNOSIS — M25511 Pain in right shoulder: Secondary | ICD-10-CM

## 2017-10-30 DIAGNOSIS — M25512 Pain in left shoulder: Secondary | ICD-10-CM

## 2017-10-30 DIAGNOSIS — M25612 Stiffness of left shoulder, not elsewhere classified: Secondary | ICD-10-CM | POA: Diagnosis not present

## 2017-10-30 DIAGNOSIS — M6281 Muscle weakness (generalized): Secondary | ICD-10-CM

## 2017-10-30 DIAGNOSIS — M25611 Stiffness of right shoulder, not elsewhere classified: Secondary | ICD-10-CM

## 2017-10-30 NOTE — Therapy (Signed)
[facility] [address] [place], [place], [address] Phone: [phone]   Fax:  [phone]  Physical Therapy Treatment  Patient Details  Name: Sara Gomez MRN: [id number] Date of Birth: 01/28/1996 Referring Provider: Dr. Roney Mansreighton   Encounter Date: 10/30/2017  PT End of Session - 10/30/17 1739    Visit Number  2    Number of Visits  8    Date for PT Re-Evaluation  11/22/17    PT Start Time  0730    PT Stop Time  0831    PT Time Calculation (min)  61 min    Activity Tolerance  Patient tolerated treatment well;Patient limited by pain    Behavior During Therapy  [facility] for tasks assessed/performed       History reviewed. No pertinent past medical history.  History reviewed. No pertinent surgical history.  There were no vitals filed for this visit.  Subjective Assessment - 10/30/17 1732    Subjective  Pt. reports no new issues.  Pt. entered PT and demonstrated 90 deg. of L shoulder flexion AROM.  PT instructed pt. to avoid active movement at this time and focus on AA/PROM per MD protocol.  Pt. states she is slowly weaning off sling.      Pertinent History  See previous PMHx.      Limitations  Lifting;Standing;Writing;House hold activities    Patient Stated Goals  Increase L shoulder ROM/ strength to improve pain-free mobility.  R shoulder ROM    Currently in Pain?  Yes    Pain Score  5     Pain Location  Shoulder    Pain Orientation  Left    Pain Descriptors / Indicators  Aching;Tender         [facility] - 10/30/17 0001      Assessment   Medical Diagnosis  s/p L Latarjet procedure/ L shoulder joint stiffness    Referring Provider  Dr. Roney Mansreighton    Onset Date/Surgical Date  10/02/17    Hand Dominance  Right    Next MD Visit  10/31/17    Prior Therapy  yes (see previous medical records).       Prior Function   Level of Independence  Independent         Treatment:   Manual tx.:  Supine L shoulder  AA/PROM all planes of movement (per MD protocol).  Pt. Limited to 0-8 deg. L sh. ER PROM in supine.  Supine wand AAROM (flexion/ ER/ abd./ press-ups)- PT assist as needed.  STM to L shoulder deltoid region/ biceps/ triceps.  Prone grade II-III PA mobs. To low cervical/ mid-thoracic region (unilateral/central) 1x20 sec. Each.  Good cervical/ thoracic mobility.  No c/o neck pain at this time but (+) tenderness over incision.    Ice to L shoulder after tx.        PT Education - 10/30/17 1357    Education provided  Yes    Education Details  Pt. educated on MD protocol.  Pulley ex. to 90 deg. flexion and abduction to 45 deg.    Person(s) Educated  Patient    Methods  Explanation;Demonstration;Handout    Comprehension  Verbalized understanding;Returned demonstration          PT Long Term Goals - 10/30/17 1406      PT LONG TERM GOAL #1   Title  Pt. I with HEP to increase L shoulder AROM to [facility] as compared to R shoulder to improve pain-free mobility.  Baseline  Supine L shoulder PROM: flexion 90 deg. per protocol, ER -3 deg. (joint stiffness/ pain), abd. 45 deg. per protocol, IR WFL.     Time  4    Period  Weeks    Status  New    Target Date  11/22/17      PT LONG TERM GOAL #2   Title  Pt. will increase L deltoid/ biceps/ triceps strength to grossly 4/5 MMT to improve return to household tasks/ running.     Baseline  Unable to assess at this time due to protocol    Time  4    Period  Weeks    Status  New    Target Date  11/22/17      PT LONG TERM GOAL #3   Title  Pt. will decrease QuickDASH to <20% to improve pain-free moblity of L shoulder.      Baseline  QuickDASH: 52.3%    Time  4    Period  Weeks    Status  New    Target Date  11/22/17      PT LONG TERM GOAL #4   Title  Pt. will report no L sh. tenderness or pain with palpation and progression to AROM over next several weeks to improve mobility.      Baseline  (+) L incision/ deltoid tenderness    Time  4    Period   Weeks    Status  New    Target Date  11/22/17            Plan - 10/30/17 1739    Clinical Impression Statement  Good cervical AROM all planes in supine position.  PT focused on L shoulder AA/PROM per MD protocol and pt. doing well except limited to 5 deg. L shoulder ER PROM.  Pt. reinstructed of MD limitations at this time and discussed sleeping position/ pillow support at night.  PT emphasized importance of HEP compliance and use of ice if any soreness.      Clinical Presentation  Stable    Clinical Decision Making  Moderate    Rehab Potential  Excellent    PT Frequency  2x / week    PT Duration  4 weeks    PT Treatment/Interventions  ADLs/Self Care Home Management;Aquatic Therapy;Cryotherapy;Electrical Stimulation;Therapeutic exercise;Patient/family education;Neuromuscular re-education;Manual techniques;Scar mobilization;Passive range of motion    PT Next Visit Plan  Follow MD protocol for L shoulder.  Discuss MD f/u and start scar massage.      PT Home Exercise Plan  Sling to be worn for 6 weeks except during hygiene/ ther.ex.    Consulted and Agree with Plan of Care  Patient       Patient will benefit from skilled therapeutic intervention in order to improve the following deficits and impairments:  Pain, Decreased mobility, Decreased scar mobility, Hypomobility, Decreased strength, Decreased range of motion, Decreased endurance, Decreased activity tolerance, Impaired flexibility  Visit Diagnosis: Shoulder joint stiffness, left  Acute pain of left shoulder  Muscle weakness (generalized)  Acute pain of right shoulder  Shoulder joint stiffness, right     Problem List Patient Active Problem List   Diagnosis Date Noted  . Headache 12/18/2012  . Hypothermia 12/18/2012   Cammie McgeeMichael C Graeson Nouri, PT, DPT # [phone] 10/30/2017, 5:44 PM   [facility] [address] [place], [place], [address] Phone: [phone]   Fax:   [phone]  Name: Sara Gomez MRN: [id number] Date of Birth: 12/30/1995

## 2017-10-30 NOTE — Therapy (Signed)
[place] [facility] [address] [place], [place], [address] Phone: [phone]   Fax:  [phone]  Physical Therapy Evaluation  Patient Details  Name: Sara Gomez MRN: [id number] Date of Birth: 07/13/1996 Referring Provider: Dr. Roney Mansreighton   Encounter Date: 10/25/2017  PT End of Session - 10/30/17 1358    Visit Number  1    Number of Visits  8    Date for PT Re-Evaluation  11/22/17    PT Start Time  0732    PT Stop Time  0829    PT Time Calculation (min)  57 min    Activity Tolerance  Patient tolerated treatment well    Behavior During Therapy  [facility] for tasks assessed/performed       History reviewed. No pertinent past medical history.  History reviewed. No pertinent surgical history.  There were no vitals filed for this visit.   Subjective Assessment - 10/30/17 1354    Subjective  Pt. s/p L shoulder Latarjet procedure on 10/02/17.  Pt. known well to PT s/p 3rd L sh. surgery and 1 R shoulder surgery.    Pertinent History  See previous PMHx.      Limitations  Lifting;Standing;Writing;House hold activities    Patient Stated Goals  Increase L shoulder ROM/ strength to improve pain-free mobility.  R shoulder ROM    Currently in Pain?  Yes    Pain Score  5     Pain Location  Shoulder    Pain Orientation  Left    Pain Descriptors / Indicators  Aching    Pain Type  Surgical pain         [facility] PT Assessment - 10/30/17 0001      Assessment   Medical Diagnosis  s/p L Latarjet procedure/ L shoulder joint stiffness    Referring Provider  Dr. Roney Mansreighton    Onset Date/Surgical Date  10/02/17    Hand Dominance  Right    Next MD Visit  10/31/17    Prior Therapy  yes (see previous medical records).       Prior Function   Level of Independence  Independent        Supine L shoulder PROM (per MD protocol)- 10x each.  Scar tissue assessment. See HEP.    PT Education - 10/30/17 1357    Education provided  Yes    Education  Details  Pt. educated on MD protocol.  Pulley ex. to 90 deg. flexion and abduction to 45 deg.    Person(s) Educated  Patient    Methods  Explanation;Demonstration;Handout    Comprehension  Verbalized understanding;Returned demonstration          PT Long Term Goals - 10/30/17 1406      PT LONG TERM GOAL #1   Title  Pt. I with HEP to increase L shoulder AROM to [facility] as compared to R shoulder to improve pain-free mobility.      Baseline  Supine L shoulder PROM: flexion 90 deg. per protocol, ER -3 deg. (joint stiffness/ pain), abd. 45 deg. per protocol, IR WFL.     Time  4    Period  Weeks    Status  New    Target Date  11/22/17      PT LONG TERM GOAL #2   Title  Pt. will increase L deltoid/ biceps/ triceps strength to grossly 4/5 MMT to improve return to household tasks/ running.     Baseline  Unable to assess at this  time due to protocol    Time  4    Period  Weeks    Status  New    Target Date  11/22/17      PT LONG TERM GOAL #3   Title  Pt. will decrease QuickDASH to <20% to improve pain-free moblity of L shoulder.      Baseline  QuickDASH: 52.3%    Time  4    Period  Weeks    Status  New    Target Date  11/22/17      PT LONG TERM GOAL #4   Title  Pt. will report no L sh. tenderness or pain with palpation and progression to AROM over next several weeks to improve mobility.      Baseline  (+) L incision/ deltoid tenderness    Time  4    Period  Weeks    Status  New    Target Date  11/22/17         Plan - 10/30/17 1359    Clinical Impression Statement  Pt. is a pleasant 21 y/o female s/p L Latarjet procedure on 10/02/17.  Pt. known well to PT secondary to recent multiple L and R sh. shoulder issues/surgeries.  Pt. arrived to PT with use of sling/ MD protocol.  Pt. not sleeping well at night and will sleep on couch in elevated position with pillow/sling.  Pt. reports 5/10 L sh. pain currently at rest.  PT explained MD protocol and importance of compliance with HEP to  improve L shoulder recovery.  Supine L shoulder PROM: flexion 90 deg. per protocol, ER -3 deg. (joint stiffness/ pain), abd. 45 deg. per protocol, IR WFL. QuickDASH: 52.3%.  Pt. presents with good incision healing with mild tenderness with palpation around distal aspect of incision.  No adduction allowed at this time.  Pt. will benefit from skilled PT services to increase L shoulder AROM/ strength to improve functional mobility as compared to R.      Clinical Presentation  Stable    Clinical Decision Making  Moderate    Rehab Potential  Excellent    PT Frequency  2x / week    PT Duration  4 weeks    PT Treatment/Interventions  ADLs/Self Care Home Management;Aquatic Therapy;Cryotherapy;Electrical Stimulation;Therapeutic exercise;Patient/family education;Neuromuscular re-education;Manual techniques;Scar mobilization;Passive range of motion    PT Next Visit Plan  Follow MD protocol for L shoulder.        PT Home Exercise Plan  Sling to be worn for 6 weeks except during hygiene/ ther.ex.    Consulted and Agree with Plan of Care  Patient       Patient will benefit from skilled therapeutic intervention in order to improve the following deficits and impairments:  Pain, Decreased mobility, Decreased scar mobility, Hypomobility, Decreased strength, Decreased range of motion, Decreased endurance, Decreased activity tolerance, Impaired flexibility  Visit Diagnosis: Shoulder joint stiffness, left  Acute pain of left shoulder  Muscle weakness (generalized)     Problem List Patient Active Problem List   Diagnosis Date Noted  . Headache 12/18/2012  . Hypothermia 12/18/2012   Cammie McgeeMichael C Tailey Top, PT, DPT # [phone] 10/30/2017, 2:10 PM  [place] [facility] [address] [place], [place], [address] Phone: [phone]   Fax:  [phone]  Name: Sara Gomez MRN: [id number] Date of Birth: 03/22/1996

## 2017-11-01 ENCOUNTER — Encounter: Payer: Self-pay | Admitting: Physical Therapy

## 2017-11-01 ENCOUNTER — Ambulatory Visit: Payer: BLUE CROSS/BLUE SHIELD | Admitting: Physical Therapy

## 2017-11-01 DIAGNOSIS — M25512 Pain in left shoulder: Secondary | ICD-10-CM

## 2017-11-01 DIAGNOSIS — M25612 Stiffness of left shoulder, not elsewhere classified: Secondary | ICD-10-CM | POA: Diagnosis not present

## 2017-11-01 DIAGNOSIS — M6281 Muscle weakness (generalized): Secondary | ICD-10-CM

## 2017-11-01 NOTE — Therapy (Signed)
[facility] [address] [place], [place], [address] Phone: [phone]   Fax:  [phone]  Physical Therapy Treatment  Patient Details  Name: Sara GullyChloe A Gomez MRN: [id number] Date of Birth: 05/26/1996 Referring Provider: Dr. Roney Mansreighton   Encounter Date: 11/01/2017  PT End of Session - 11/01/17 0743    Visit Number  3    Number of Visits  8    Date for PT Re-Evaluation  11/22/17    PT Start Time  0728    PT Stop Time  0830    PT Time Calculation (min)  62 min    Activity Tolerance  Patient tolerated treatment well;Patient limited by pain    Behavior During Therapy  [facility] for tasks assessed/performed       History reviewed. No pertinent past medical history.  History reviewed. No pertinent surgical history.  There were no vitals filed for this visit.  Subjective Assessment - 11/01/17 0742    Subjective  Pt. states MD f/u visit went well.  Pt. weaning off of sling at this time.  Pt. will continue per MD protocol.      Limitations  Lifting;Standing;Writing;House hold activities    Patient Stated Goals  Increase L shoulder ROM/ strength to improve pain-free mobility.  R shoulder ROM    Currently in Pain?  Yes    Pain Score  2     Pain Location  Shoulder    Pain Orientation  Left    Pain Descriptors / Indicators  Aching;Tender    Pain Type  Surgical pain      MD f/u February 6th   Treatment:   Manual tx.:  Supine L shoulder AA/PROM all planes of movement (per MD protocol).  Pt. Limited to 8 deg. L sh. ER PROM in supine.  Supine wand AAROM (flexion/ ER/ abd./ press-ups)- PT assist as needed.  STM to L shoulder deltoid region/ biceps/ triceps.  No c/o neck pain at this time but (+) tenderness over incision.    Therex.:  Supine L shoulder isometrics all planes (controlled neutral positioning) 8x each.  Standing wall ladder shoulder flexion 10x.    Ice to L shoulder after tx. In sitting posture.      PT Long Term Goals -  10/30/17 1406      PT LONG TERM GOAL #1   Title  Pt. I with HEP to increase L shoulder AROM to [facility] as compared to R shoulder to improve pain-free mobility.      Baseline  Supine L shoulder PROM: flexion 90 deg. per protocol, ER -3 deg. (joint stiffness/ pain), abd. 45 deg. per protocol, IR WFL.     Time  4    Period  Weeks    Status  New    Target Date  11/22/17      PT LONG TERM GOAL #2   Title  Pt. will increase L deltoid/ biceps/ triceps strength to grossly 4/5 MMT to improve return to household tasks/ running.     Baseline  Unable to assess at this time due to protocol    Time  4    Period  Weeks    Status  New    Target Date  11/22/17      PT LONG TERM GOAL #3   Title  Pt. will decrease QuickDASH to <20% to improve pain-free moblity of L shoulder.      Baseline  QuickDASH: 52.3%    Time  4  Period  Weeks    Status  New    Target Date  11/22/17      PT LONG TERM GOAL #4   Title  Pt. will report no L sh. tenderness or pain with palpation and progression to AROM over next several weeks to improve mobility.      Baseline  (+) L incision/ deltoid tenderness    Time  4    Period  Weeks    Status  New    Target Date  11/22/17            Plan - 11/01/17 0744    Clinical Impression Statement  Pt. progressing well with L shoulder AAROM per MD protocol.  L anterior deltoid tenderness with palpation.  Gentle isometrics in control supine L shoulder neutral positioning.  Marked increase in L shoulder AAROM in standing posture at wall ladder (marked increase with sticker).  Pt. instructed to wean off of sling over next week.      Clinical Presentation  Stable    Clinical Decision Making  Moderate    Rehab Potential  Excellent    PT Frequency  2x / week    PT Duration  4 weeks    PT Treatment/Interventions  ADLs/Self Care Home Management;Aquatic Therapy;Cryotherapy;Electrical Stimulation;Therapeutic exercise;Patient/family education;Neuromuscular re-education;Manual  techniques;Scar mobilization;Passive range of motion    PT Next Visit Plan  Follow MD protocol for L shoulder.      PT Home Exercise Plan  Sling to be worn for 6 weeks except during hygiene/ ther.ex.    Consulted and Agree with Plan of Care  Patient       Patient will benefit from skilled therapeutic intervention in order to improve the following deficits and impairments:  Pain, Decreased mobility, Decreased scar mobility, Hypomobility, Decreased strength, Decreased range of motion, Decreased endurance, Decreased activity tolerance, Impaired flexibility  Visit Diagnosis: Shoulder joint stiffness, left  Acute pain of left shoulder  Muscle weakness (generalized)     Problem List Patient Active Problem List   Diagnosis Date Noted  . Headache 12/18/2012  . Hypothermia 12/18/2012   Sara McgeeMichael C Gomez Metheny, PT, DPT # [id number] 11/01/2017, 9:31 AM  [place] [facility] [address] [place], [place], [address] Phone: [phone]   Fax:  [phone]  Name: Sara GullyChloe A Gomez MRN: [id number] Date of Birth: 05/24/1996

## 2017-11-05 ENCOUNTER — Ambulatory Visit: Payer: BLUE CROSS/BLUE SHIELD | Admitting: Physical Therapy

## 2017-11-05 ENCOUNTER — Encounter: Payer: Self-pay | Admitting: Physical Therapy

## 2017-11-05 DIAGNOSIS — M6281 Muscle weakness (generalized): Secondary | ICD-10-CM

## 2017-11-05 DIAGNOSIS — M25612 Stiffness of left shoulder, not elsewhere classified: Secondary | ICD-10-CM

## 2017-11-05 DIAGNOSIS — M25512 Pain in left shoulder: Secondary | ICD-10-CM

## 2017-11-05 NOTE — Therapy (Signed)
[place] [facility] [address] [place], [place], [address] Phone: [phone]   Fax:  [phone]  Physical Therapy Treatment  Patient Details  Name: Sara Gomez MRN: [id number] Date of Birth: 09/22/1996 Referring Provider: Dr. Roney Mansreighton   Encounter Date: 11/05/2017  PT End of Session - 11/05/17 0858    Visit Number  4    Number of Visits  8    Date for PT Re-Evaluation  11/22/17    PT Start Time  0729    PT Stop Time  0828    PT Time Calculation (min)  59 min    Activity Tolerance  Patient tolerated treatment well;Patient limited by pain    Behavior During Therapy  [facility] for tasks assessed/performed       History reviewed. No pertinent past medical history.  History reviewed. No pertinent surgical history.  There were no vitals filed for this visit.  Subjective Assessment - 11/05/17 0858    Subjective  No new complaints.  Pt. states she is compliant with HEP but accidently reached across body the other day (no increase c/o pain).  PT reviewed MD protocol with pt.      Pertinent History  See previous PMHx.      Limitations  Lifting;Standing;Writing;House hold activities    Patient Stated Goals  Increase L shoulder ROM/ strength to improve pain-free mobility.  R shoulder ROM    Currently in Pain?  No/denies         Treatment:   Manual tx.: Supine L shoulder AA/PROM all planes of movement (per MD protocol). Pt. Limited to <10 deg. L sh. ER PROM in supine. Supine wand AAROM (flexion/ ER/ abd./ press-ups)- PT assist as needed. STM to L shoulder deltoid region/ biceps/ triceps. No c/o neck pain at this time but (+) tenderness over incision.   Therex.:  Supine L shoulder isometrics all planes (controlled neutral positioning) 10x each.  Standing shoulder flexion AAROM with ball/ PT assist at wall.  Supine bicep curls with manual resistance/ 2# 20x.  Ice to L shoulder in sitting after tx.        PT Long Term Goals -  10/30/17 1406      PT LONG TERM GOAL #1   Title  Pt. I with HEP to increase L shoulder [facility]OM to [facility] as compared to R shoulder to improve pain-free mobility.      Baseline  Supine L shoulder PROM: flexion 90 deg. per protocol, ER -3 deg. (joint stiffness/ pain), abd. 45 deg. per protocol, IR WFL.     Time  4    Period  Weeks    Status  New    Target Date  11/22/17      PT LONG TERM GOAL #2   Title  Pt. will increase L deltoid/ biceps/ triceps strength to grossly 4/5 MMT to improve return to household tasks/ running.     Baseline  Unable to assess at this time due to protocol    Time  4    Period  Weeks    Status  New    Target Date  11/22/17      PT LONG TERM GOAL #3   Title  Pt. will decrease QuickDASH to <20% to improve pain-free moblity of L shoulder.      Baseline  QuickDASH: 52.3%    Time  4    Period  Weeks    Status  New    Target Date  11/22/17  PT LONG TERM GOAL #4   Title  Pt. will report no L sh. tenderness or pain with palpation and progression to AROM over next several weeks to improve mobility.      Baseline  (+) L incision/ deltoid tenderness    Time  4    Period  Weeks    Status  New    Target Date  11/22/17            Plan - 11/05/17 0859    Clinical Impression Statement  Pt. continues to progress well with L shoulder ROM/ isometrics but remains limited with supine ER in neutral posture (<10 deg. PROM).  Good L elbow AROM and progressing with grip strength/ isometrics.  L incision remains tender as well as supraspinatus muscle region with palpation/ STM.  Pt. encouraged to wean off of sling and work on a daily basis with HEP/ L shoulder ER PROM.      Clinical Presentation  Stable    Clinical Decision Making  Moderate    Rehab Potential  Excellent    PT Frequency  2x / week    PT Duration  4 weeks    PT Next Visit Plan  Follow MD protocol for L shoulder.      PT Home Exercise Plan  Sling to be worn for 6 weeks except during hygiene/ ther.ex.        Patient will benefit from skilled therapeutic intervention in order to improve the following deficits and impairments:  Pain, Decreased mobility, Decreased scar mobility, Hypomobility, Decreased strength, Decreased range of motion, Decreased endurance, Decreased activity tolerance, Impaired flexibility  Visit Diagnosis: Shoulder joint stiffness, left  Acute pain of left shoulder  Muscle weakness (generalized)     Problem List Patient Active Problem List   Diagnosis Date Noted  . Headache 12/18/2012  . Hypothermia 12/18/2012   Cammie McgeeMichael C Patrecia Veiga, PT, DPT # [id number] 11/05/2017, 9:01 AM  [place] [facility] [address] [place], [place], [address] Phone: [phone]   Fax:  [phone]  Name: Sara Gomez MRN: [id number] Date of Birth: 05/07/1996

## 2017-11-08 ENCOUNTER — Ambulatory Visit: Payer: BLUE CROSS/BLUE SHIELD | Admitting: Physical Therapy

## 2017-11-08 ENCOUNTER — Encounter: Payer: Self-pay | Admitting: Physical Therapy

## 2017-11-08 DIAGNOSIS — M25612 Stiffness of left shoulder, not elsewhere classified: Secondary | ICD-10-CM | POA: Diagnosis not present

## 2017-11-08 DIAGNOSIS — M25512 Pain in left shoulder: Secondary | ICD-10-CM

## 2017-11-08 DIAGNOSIS — M6281 Muscle weakness (generalized): Secondary | ICD-10-CM

## 2017-11-11 NOTE — Therapy (Signed)
[place] [facility] [address] [place], [place], [address] Phone: [phone]   Fax:  [phone]  Physical Therapy Treatment  Patient Details  Name: Sara GullyChloe A Gomez MRN: [id number] Date of Birth: 06/20/1996 Referring Provider: Dr. Roney Mansreighton   Encounter Date: 11/08/2017   Treatment 5 of 8.  Recert date: 11/22/17   History reviewed. No pertinent past medical history.  History reviewed. No pertinent surgical history.  There were no vitals filed for this visit.    Pt. sore in L shoulder after last tx.  Pt. reports 4/10 L deltoid pain.         Treatment:   Manual tx.: Supine L shoulder AA/PROM all planes of movement (per MD protocol). Pt. Limited to <10 deg. L sh. ER PROM in supine. Supine wand AAROM (flexion/ ER/ abd./ press-ups)- PT assist as needed. STM to L shoulder deltoid region/ biceps/ triceps. No c/o neck pain at this time but (+) tenderness over incision. (17 min.).   Therex.: Supine L shoulder isometrics all planes (controlled neutral positioning) 10x each. Standing shoulder flexion AAROM with ball/ PT assist at wall.  Supine bicep curls with manual resistance/ 2# 20x.  Will issue isometric HEP next tx. Session.    Ice to L shoulder in sitting after tx.          Pt. progressing towards MD protocol goals except L shoulder ER.  Incision tenderness remains present with good healing noted.  Pt. will begin isometric as part of HEP next week.  2 more tx. session before discharge secondary [place].  [facility]. [facility]       PT Long Term Goals - 10/30/17 1406      PT LONG TERM GOAL #1   Title  Pt. I with HEP to increase L shoulder AROM to [facility] as compared to R shoulder to improve pain-free mobility.      Baseline  Supine L shoulder PROM: flexion 90 deg. per protocol, ER -3 deg. (joint stiffness/ pain), abd. 45 deg. per protocol, IR WFL.     Time  4    Period  Weeks    Status  New    Target Date  11/22/17      PT LONG TERM GOAL #2   Title  Pt. will increase L deltoid/ biceps/ triceps strength to grossly 4/5 MMT to improve return to household tasks/ running.     Baseline  Unable to assess at this time due to protocol    Time  4    Period  Weeks    Status  New    Target Date  11/22/17      PT LONG TERM GOAL #3   Title  Pt. will decrease QuickDASH to <20% to improve pain-free moblity of L shoulder.      Baseline  QuickDASH: 52.3%    Time  4    Period  Weeks    Status  New    Target Date  11/22/17      PT LONG TERM GOAL #4   Title  Pt. will report no L sh. tenderness or pain with palpation and progression to AROM over next several weeks to improve mobility.      Baseline  (+) L incision/ deltoid tenderness    Time  4    Period  Weeks    Status  New    Target Date  11/22/17  Patient will benefit from skilled therapeutic intervention in order to improve the following deficits and impairments:  Pain, Decreased mobility, Decreased scar mobility, Hypomobility, Decreased strength, Decreased range of motion, Decreased endurance, Decreased activity tolerance, Impaired flexibility  Visit Diagnosis: Shoulder joint stiffness, left  Acute pain of left shoulder  Muscle weakness (generalized)     Problem List Patient Active Problem List   Diagnosis Date Noted  . Headache 12/18/2012  . Hypothermia 12/18/2012   Sara McgeeMichael C Daniyla Gomez, PT, DPT # [id number] 11/11/2017, 10:57 AM  [place] [facility] [address] [place], [place], [address] Phone: [phone]   Fax:  [phone]  Name: Sara GullyChloe A Gomez MRN: [id number] Date of Birth: 12/18/1995

## 2017-11-12 ENCOUNTER — Encounter: Payer: Self-pay | Admitting: Physical Therapy

## 2017-11-12 ENCOUNTER — Ambulatory Visit: Payer: BLUE CROSS/BLUE SHIELD | Admitting: Physical Therapy

## 2017-11-12 DIAGNOSIS — M25512 Pain in left shoulder: Secondary | ICD-10-CM

## 2017-11-12 DIAGNOSIS — M6281 Muscle weakness (generalized): Secondary | ICD-10-CM

## 2017-11-12 DIAGNOSIS — M25612 Stiffness of left shoulder, not elsewhere classified: Secondary | ICD-10-CM

## 2017-11-12 NOTE — Therapy (Signed)
[place] [facility] [address] [place], [place], [address] Phone: [phone]   Fax:  [phone]  Physical Therapy Treatment  Patient Details  Name: Sara GullyChloe A Gomez MRN: [id number] Date of Birth: 12/10/1995 Referring Provider: Dr. Roney Mansreighton   Encounter Date: 11/12/2017  PT End of Session - 11/12/17 0716    Visit Number  6    Number of Visits  8    Date for PT Re-Evaluation  11/22/17    PT Start Time  0729    PT Stop Time  0825    PT Time Calculation (min)  56 min    Activity Tolerance  Patient tolerated treatment well;Patient limited by pain    Behavior During Therapy  [facility] for tasks assessed/performed       History reviewed. No pertinent past medical history.  History reviewed. No pertinent surgical history.  There were no vitals filed for this visit.  Subjective Assessment - 11/12/17 0714    Subjective  Pt. reports 5/10 L shoulder pain.  Pt. hurting this morning/ no reason given.      Pertinent History  See previous PMHx.      Limitations  Lifting;Standing;Writing;House hold activities    Patient Stated Goals  Increase L shoulder ROM/ strength to improve pain-free mobility.  R shoulder ROM    Currently in Pain?  Yes    Pain Score  5     Pain Location  Shoulder    Pain Orientation  Left    Pain Descriptors / Indicators  Aching    Pain Type  Surgical pain         Treatment:   There.ex.  Standing/ supine wand ex. (all planes per MD protocol)- limited with ER. See new HEP handouts (isometrics at door frame)- no pain.  Good technique Prone L shoulder AROM flexion/ abd./ extension (as tolerated).  Supine L biceps/ triceps/ ER manual isometrics (as tolerated) 5x each.  Manual tx.  Supine L shoulder AA/PROM all planes of movement per MD protocol Grade II AP/PA/inf. Mobs. (gentle) 3x20 sec. (see protocol limitations).  STM to L anterior deltoid/ prox. Biceps. (tenderness)  Ice to L shoulder in sitting after tx.       PT Education - 11/12/17 0728    Education provided  Yes    Education Details  See HEP/ isometrics    Person(s) Educated  Patient    Methods  Explanation;Demonstration;Handout    Comprehension  Verbalized understanding;Returned demonstration          PT Long Term Goals - 10/30/17 1406      PT LONG TERM GOAL #1   Title  Pt. I with HEP to increase L shoulder AROM to [facility] as compared to R shoulder to improve pain-free mobility.      Baseline  Supine L shoulder PROM: flexion 90 deg. per protocol, ER -3 deg. (joint stiffness/ pain), abd. 45 deg. per protocol, IR WFL.     Time  4    Period  Weeks    Status  New    Target Date  11/22/17      PT LONG TERM GOAL #2   Title  Pt. will increase L deltoid/ biceps/ triceps strength to grossly 4/5 MMT to improve return to household tasks/ running.     Baseline  Unable to assess at this time due to protocol    Time  4    Period  Weeks    Status  New    Target Date  11/22/17      PT LONG TERM GOAL #3   Title  Pt. will decrease QuickDASH to <20% to improve pain-free moblity of L shoulder.      Baseline  QuickDASH: 52.3%    Time  4    Period  Weeks    Status  New    Target Date  11/22/17      PT LONG TERM GOAL #4   Title  Pt. will report no L sh. tenderness or pain with palpation and progression to AROM over next several weeks to improve mobility.      Baseline  (+) L incision/ deltoid tenderness    Time  4    Period  Weeks    Status  New    Target Date  11/22/17            Plan - 11/12/17 0715    Clinical Impression Statement  Good technique with standing L shoulder isometrics all planes (see MD protocol).  PT really encouraged pt. to be compliant with HEP, esp. stabilization/ isometric ex.   Limited L shoulder flexion in prone position and supine L shoulder ER remains stiff in joint.  No increase c/o L shoulder pain during tx. session.  Pt. has 2 more appts. scheduled before [place].  Pt. will  contact [facility] to get continued PT tx. sessions.      Clinical Presentation  Stable    Clinical Decision Making  Moderate    Rehab Potential  Excellent    PT Frequency  2x / week    PT Duration  4 weeks    PT Treatment/Interventions  ADLs/Self Care Home Management;Aquatic Therapy;Cryotherapy;Electrical Stimulation;Therapeutic exercise;Patient/family education;Neuromuscular re-education;Manual techniques;Scar mobilization;Passive range of motion    PT Next Visit Plan  Follow MD protocol for L shoulder.  Isometric HEP.  Check to see if pt. called to schedule [place] in [place].      PT Home Exercise Plan  Sling to be worn for 6 weeks except during hygiene/ ther.ex.    Consulted and Agree with Plan of Care  Patient       Patient will benefit from skilled therapeutic intervention in order to improve the following deficits and impairments:  Pain, Decreased mobility, Decreased scar mobility, Hypomobility, Decreased strength, Decreased range of motion, Decreased endurance, Decreased activity tolerance, Impaired flexibility  Visit Diagnosis: Shoulder joint stiffness, left  Acute pain of left shoulder  Muscle weakness (generalized)     Problem List Patient Active Problem List   Diagnosis Date Noted  . Headache 12/18/2012  . Hypothermia 12/18/2012   Sara McgeeMichael C Iram Gomez, PT, DPT # [id number] 11/12/2017, 4:58 PM  [place] [facility] [address] [place], [place], [address] Phone: [phone]   Fax:  [phone]  Name: Sara GullyChloe A Gomez MRN: [id number] Date of Birth: 02/17/1996

## 2017-11-14 ENCOUNTER — Ambulatory Visit: Payer: BLUE CROSS/BLUE SHIELD | Attending: Orthopedic Surgery | Admitting: Physical Therapy

## 2017-11-14 ENCOUNTER — Encounter: Payer: Self-pay | Admitting: Physical Therapy

## 2017-11-14 DIAGNOSIS — M25512 Pain in left shoulder: Secondary | ICD-10-CM | POA: Insufficient documentation

## 2017-11-14 DIAGNOSIS — M6281 Muscle weakness (generalized): Secondary | ICD-10-CM | POA: Insufficient documentation

## 2017-11-14 DIAGNOSIS — M25612 Stiffness of left shoulder, not elsewhere classified: Secondary | ICD-10-CM | POA: Insufficient documentation

## 2017-11-14 NOTE — Therapy (Signed)
[place] [facility] [address] [place], [place], [address] Phone: [phone]   Fax:  [phone]  Physical Therapy Treatment  Patient Details  Name: Sara GullyChloe A Gomez MRN: [id number] Date of Birth: 06/23/1996 Referring Provider: Dr. Roney Mansreighton   Encounter Date: 11/14/2017  PT End of Session - 11/14/17 0719    Visit Number  7    Number of Visits  8    Date for PT Re-Evaluation  11/22/17    PT Start Time  0729    PT Stop Time  0828    PT Time Calculation (min)  59 min    Activity Tolerance  Patient tolerated treatment well;Patient limited by pain    Behavior During Therapy  [facility] for tasks assessed/performed       History reviewed. No pertinent past medical history.  History reviewed. No pertinent surgical history.  There were no vitals filed for this visit.  Subjective Assessment - 11/14/17 0718    Subjective  Pt. is very tired this morning.  Pt. states her eyes are really itchy this morning.  Pt. reports minimal L shoulder pain (1/10).      Pertinent History  See previous PMHx.      Limitations  Lifting;Standing;Writing;House hold activities    Patient Stated Goals  Increase L shoulder ROM/ strength to improve pain-free mobility.  R shoulder ROM    Currently in Pain?  Yes    Pain Score  1     Pain Location  Shoulder    Pain Orientation  Left    Pain Descriptors / Indicators  Aching       Treatment:   There.ex.  Supine wand ex. (all planes per MD protocol)- limited with ER. Reviewed/discussed home isometrics at door frame- no pain.   Prone L shoulder AROM flexion/ abd./ extension (as tolerated).  Supine L biceps/ triceps/ ER manual isometrics (as tolerated) 5x each. No changes to HEP at this time.    Manual tx.  Supine L shoulder AA/PROM all planes of movement per MD protocol Grade II AP/PA/inf. Mobs. (gentle) 3x20 sec. (see protocol limitations).  Prone upper thoracic/cervical PA mobs. Grade II-III 2x20 sec. STM to L  anterior deltoid/ prox. Biceps. (tenderness)  Ice to L shoulder in sitting after tx.        PT Long Term Goals - 10/30/17 1406      PT LONG TERM GOAL #1   Title  Pt. I with HEP to increase L shoulder AROM to [facility] as compared to R shoulder to improve pain-free mobility.      Baseline  Supine L shoulder PROM: flexion 90 deg. per protocol, ER -3 deg. (joint stiffness/ pain), abd. 45 deg. per protocol, IR WFL.     Time  4    Period  Weeks    Status  New    Target Date  11/22/17      PT LONG TERM GOAL #2   Title  Pt. will increase L deltoid/ biceps/ triceps strength to grossly 4/5 MMT to improve return to household tasks/ running.     Baseline  Unable to assess at this time due to protocol    Time  4    Period  Weeks    Status  New    Target Date  11/22/17      PT LONG TERM GOAL #3   Title  Pt. will decrease QuickDASH to <20% to improve pain-free moblity of L shoulder.      Baseline  QuickDASH: 52.3%    Time  4    Period  Weeks    Status  New    Target Date  11/22/17      PT LONG TERM GOAL #4   Title  Pt. will report no L sh. tenderness or pain with palpation and progression to AROM over next several weeks to improve mobility.      Baseline  (+) L incision/ deltoid tenderness    Time  4    Period  Weeks    Status  New    Target Date  11/22/17         Plan - 11/14/17 0719    Clinical Impression Statement  Pt. doing well with supine L shoulder isometrics with manual feedback from PT.  L anterior deltoid/ proximal biceps tenderness with palpation.  No changes to HEP at this time with continued focus on L shoulder isometrics and increasing ER AROM.      Clinical Presentation  Stable    Clinical Decision Making  Moderate    Rehab Potential  Excellent    PT Frequency  2x / week    PT Duration  4 weeks    PT Treatment/Interventions  ADLs/Self Care Home Management;Aquatic Therapy;Cryotherapy;Electrical Stimulation;Therapeutic exercise;Patient/family education;Neuromuscular  re-education;Manual techniques;Scar mobilization;Passive range of motion    PT Next Visit Plan  Follow MD protocol for L shoulder.  Isometric HEP.      PT Home Exercise Plan  Sling to be worn for 6 weeks except during hygiene/ ther.ex.       Patient will benefit from skilled therapeutic intervention in order to improve the following deficits and impairments:  Pain, Decreased mobility, Decreased scar mobility, Hypomobility, Decreased strength, Decreased range of motion, Decreased endurance, Decreased activity tolerance, Impaired flexibility  Visit Diagnosis: Shoulder joint stiffness, left  Acute pain of left shoulder  Muscle weakness (generalized)     Problem List Patient Active Problem List   Diagnosis Date Noted  . Headache 12/18/2012  . Hypothermia 12/18/2012   [name], PT, DPT # [id number] 11/14/2017, 8:10 AM  [place] [facility] [address] [place], [place], [address] Phone: [phone]   Fax:  [phone]  Name: Sara Gomez MRN: [id number] Date of Birth: 1996-07-08

## 2017-11-16 ENCOUNTER — Ambulatory Visit: Payer: BLUE CROSS/BLUE SHIELD | Admitting: Physical Therapy

## 2017-11-16 ENCOUNTER — Encounter: Payer: Self-pay | Admitting: Physical Therapy

## 2017-11-16 DIAGNOSIS — M25512 Pain in left shoulder: Secondary | ICD-10-CM

## 2017-11-16 DIAGNOSIS — M25612 Stiffness of left shoulder, not elsewhere classified: Secondary | ICD-10-CM | POA: Diagnosis not present

## 2017-11-16 DIAGNOSIS — M6281 Muscle weakness (generalized): Secondary | ICD-10-CM

## 2017-11-16 NOTE — Therapy (Signed)
[facility] [address] [place], [place], [address] Phone: [phone]   Fax:  [phone]  Physical Therapy Treatment  Patient Details  Name: Keyandra Swenson Patino MRN: [id number] Date of Birth: [birth year] Referring Provider: Dr. Jeannie Fend   Encounter Date: 11/16/2017  PT End of Session - 11/16/17 0912    Visit Number  8    Number of Visits  8    Date for PT Re-Evaluation  11/22/17    PT Start Time  0731    PT Stop Time  0837    PT Time Calculation (min)  66 min    Activity Tolerance  Patient tolerated treatment well;Patient limited by pain    Behavior During Therapy  [facility] for tasks assessed/performed       History reviewed. No pertinent past medical history.  History reviewed. No pertinent surgical history.  There were no vitals filed for this visit.  Subjective Assessment - 11/16/17 0734    Subjective  Pt. states she is feeling sick this morning but is still planning on going to last day of work.  Pt. starts at [facility] next Monday and will be discharge from PT at this time.      Pertinent History  See previous PMHx.      Limitations  Lifting;Standing;Writing;House hold activities    Patient Stated Goals  Increase L shoulder ROM/ strength to improve pain-free mobility.  R shoulder ROM    Currently in Pain?  Yes    Pain Score  1     Pain Location  Shoulder    Pain Orientation  Left    Pain Descriptors / Indicators  Aching    Pain Type  Surgical pain         Treatment:   There.ex.  Reviewed/discussed home exercises and returning to jogging..  Prone L shoulder AROM flexion/ abd./ extension/ scap retraction (as tolerated).  Supine L biceps/ triceps/ ER manual isometrics (as tolerated) 5x each. Standing ball serratus punches (CW/CCW)/ shoulder flexion on wall.  Manual tx.  Supine L shoulder AA/PROM all planes of movement per MD protocol Grade II AP/PA/inf. Mobs. (gentle) 3x20 sec. (see protocol  limitations).  Prone upper thoracic/cervical PA mobs. Grade II-III 2x20 sec. STM to L anterior deltoid/ prox. Biceps. (tenderness)  Ice to L shoulder in prone after tx.  Goal reassessment    PT Education - 11/16/17 0911    Education provided  Yes    Education Details  Reviewed MD protocol/ isometrics    Person(s) Educated  Patient    Methods  Explanation;Demonstration    Comprehension  Verbalized understanding;Returned demonstration          PT Long Term Goals - 11/16/17 0917      PT LONG TERM GOAL #1   Title  Pt. I with HEP to increase L shoulder AROM to [facility] as compared to R shoulder to improve pain-free mobility.      Baseline  Seated L sh. AROM in pain-free range: flexion (140 deg.), abduction (98 deg.), ER (8 deg.), IR (WNL).     Time  4    Period  Weeks    Status  Partially Met      PT LONG TERM GOAL #2   Title  Pt. will increase L deltoid/ biceps/ triceps strength to grossly 4/5 MMT to improve return to household tasks/ running.     Baseline  L shoulder strength grossly 3+/5 MMT, biceps/triceps 4/5 MMT    Time  4  Period  Weeks    Status  Partially Met      PT LONG TERM GOAL #3   Title  Pt. will decrease QuickDASH to <20% to improve pain-free moblity of L shoulder.      Baseline  QuickDASH: 52.3% on initial eval.   11/16/17:  25% (marked improvement).      Time  4    Period  Weeks    Status  Partially Met      PT LONG TERM GOAL #4   Title  Pt. will report no L sh. tenderness or pain with palpation and progression to AROM over next several weeks to improve mobility.      Baseline  (+) L incision/ deltoid tenderness    Time  4    Period  Weeks    Status  Not Met            Plan - 11/16/17 0735    Clinical Impression Statement  Seated L sh. AROM in pain-free range: flexion (140 deg.), abduction (98 deg.), ER (8 deg.), IR (WNL).  Good L sh. capsular mobility but discomfort reported ER/ abduction.  Pt. remains tender over incision with light palpation.   PT encouaged pt. to massage area and desensitize incision to improve pain/ prevent adhesions.  Grip strength: R 41.5#, L 33#.  QuickDASH: 25%.  Pt. will discharge from PT at this time with referral to another PT clinic closer to [facility].  Pts. MD order/ protocol faxed to [facility] per pt. request.         Clinical Presentation  Stable    Clinical Decision Making  Moderate    Rehab Potential  Excellent    PT Frequency  2x / week    PT Duration  4 weeks    PT Treatment/Interventions  ADLs/Self Care Home Management;Aquatic Therapy;Cryotherapy;Electrical Stimulation;Therapeutic exercise;Patient/family education;Neuromuscular re-education;Manual techniques;Scar mobilization;Passive range of motion    PT Next Visit Plan  Discharge       Patient will benefit from skilled therapeutic intervention in order to improve the following deficits and impairments:  Pain, Decreased mobility, Decreased scar mobility, Hypomobility, Decreased strength, Decreased range of motion, Decreased endurance, Decreased activity tolerance, Impaired flexibility  Visit Diagnosis: Shoulder joint stiffness, left  Acute pain of left shoulder  Muscle weakness (generalized)     Problem List Patient Active Problem List   Diagnosis Date Noted  . Headache 12/18/2012  . Hypothermia 12/18/2012   Pura Spice, PT, DPT # [phone] 11/16/2017, 9:19 AM  [facility] [address] [place], [place], [address] Phone: [phone]   Fax:  [phone]  Name: Korina Tretter Fries MRN: [id number] Date of Birth: Apr 19, 1996

## 2017-12-10 DIAGNOSIS — M25612 Stiffness of left shoulder, not elsewhere classified: Secondary | ICD-10-CM | POA: Diagnosis not present

## 2017-12-10 DIAGNOSIS — M25512 Pain in left shoulder: Secondary | ICD-10-CM | POA: Diagnosis not present

## 2017-12-12 DIAGNOSIS — M25512 Pain in left shoulder: Secondary | ICD-10-CM | POA: Diagnosis not present

## 2017-12-12 DIAGNOSIS — M25612 Stiffness of left shoulder, not elsewhere classified: Secondary | ICD-10-CM | POA: Diagnosis not present

## 2017-12-21 DIAGNOSIS — S43005D Unspecified dislocation of left shoulder joint, subsequent encounter: Secondary | ICD-10-CM | POA: Diagnosis not present

## 2017-12-21 DIAGNOSIS — Z9889 Other specified postprocedural states: Secondary | ICD-10-CM | POA: Diagnosis not present

## 2017-12-24 DIAGNOSIS — M25512 Pain in left shoulder: Secondary | ICD-10-CM | POA: Diagnosis not present

## 2017-12-24 DIAGNOSIS — M25612 Stiffness of left shoulder, not elsewhere classified: Secondary | ICD-10-CM | POA: Diagnosis not present

## 2017-12-26 DIAGNOSIS — M25512 Pain in left shoulder: Secondary | ICD-10-CM | POA: Diagnosis not present

## 2017-12-26 DIAGNOSIS — M25612 Stiffness of left shoulder, not elsewhere classified: Secondary | ICD-10-CM | POA: Diagnosis not present

## 2018-04-15 ENCOUNTER — Other Ambulatory Visit: Payer: Self-pay

## 2018-04-15 ENCOUNTER — Encounter (INDEPENDENT_AMBULATORY_CARE_PROVIDER_SITE_OTHER): Payer: Self-pay

## 2018-04-15 ENCOUNTER — Ambulatory Visit: Payer: BLUE CROSS/BLUE SHIELD | Admitting: Gastroenterology

## 2018-04-15 ENCOUNTER — Encounter: Payer: Self-pay | Admitting: Gastroenterology

## 2018-04-15 VITALS — BP 105/70 | HR 61 | Temp 98.0°F | Ht 67.5 in | Wt 137.6 lb

## 2018-04-15 DIAGNOSIS — R1013 Epigastric pain: Secondary | ICD-10-CM

## 2018-04-15 DIAGNOSIS — R140 Abdominal distension (gaseous): Secondary | ICD-10-CM

## 2018-04-15 NOTE — Patient Instructions (Signed)
F/U 6 months FOD handout given

## 2018-04-15 NOTE — Progress Notes (Signed)
Sara Bouillon, MD [address]  [address]  [place], [place] [address]  Main: [phone]  Fax: [phone]   Primary Care Physician: Sara Grays, MD  Primary Gastroenterologist:  Dr. Melodie Gomez  Chief Complaint  Patient presents with  . Follow-up    dyspepsia, constipation. For last 3 months c/o bloating, abdominal pain, projectile vomiting after eating meat (has not eaten meat in 3 months) with periods of vomiting    HPI: Sara Gomez is a 22 y.o. female here for follow-up.  Her main complaint is bloating.  Describes bilateral lower quadrant abdominal bloating after meals. Reports vomiting 20 mins after eating meat only. No emesis with any other kind of food.  No abdominal pain.  No dysphagia.  No heartburn.  States she took omeprazole daily for 2 months and that did not help so she stopped it herself.  Is taking MiraLAX and is having 1-3 soft problems daily.  This is not helped her bloating.  No hematemesis.  No altered bowel habits.  No blood in stool.  Stool H. pylori test was negative in November 2018. Has had 5 shoulder surgeries, and has been receiving narcotics intermittently for them.   Current Outpatient Medications  Medication Sig Dispense Refill  . acetaminophen-codeine (TYLENOL #3) 300-30 MG tablet TAKE 1-2 TABLETS BY MOUTH EVERY 4 HOURS AS NEEDED FOR PAIN FOR UP TO 5 DAYS  0  . Adapalene-Benzoyl Peroxide 0.1-2.5 % gel APPLY TO AFFECTED AREA AT BEDTIME    . albuterol (PROVENTIL HFA;VENTOLIN HFA) 108 (90 Base) MCG/ACT inhaler INHALE 2 PUFFS EVERY 4 HOURS AS NEEDED FOR COUGH OR WHEEZE  0  . busPIRone (BUSPAR) 5 MG tablet TAKE 1 TAB BY MOUTH EVERY 12 HOURS FOR 30 DAYS AS NEEDED  0  . citalopram (CELEXA) 10 MG tablet Take 10 mg by mouth daily.  1  . clindamycin-benzoyl peroxide (BENZACLIN) gel APPLY TO THE AFFECTED AREA EVERY DAY BEFORE NOON    . FLOVENT HFA 44 MCG/ACT inhaler TAKE 2 INHALATIONS BY MOUTH TWICE DAILY  3  . ibuprofen (ADVIL,MOTRIN)  200 MG tablet Take 400 mg by mouth.    . mupirocin ointment (BACTROBAN) 2 % APPLY TOPICALLY TWICE A DAY    . naproxen sodium (ANAPROX) 550 MG tablet TAKE 1 TAB BY MOUTH EVERY 12 HOURS    . ondansetron (ZOFRAN-ODT) 8 MG disintegrating tablet TAKE 1 TAB BY MOUTH EVERY 8 HOURS AS NEEDED FOR NAUSEA AND VOMITING  0  . polyethylene glycol (MIRALAX / GLYCOLAX) packet Take by mouth.    . promethazine (PHENERGAN) 12.5 MG tablet TAKE 1 TABLET BY MOUTH EVERY 6 HOURS AS NEEDED FOR NAUSEA FOR UP TO 7 DAYS  0  . silver sulfADIAZINE (SILVADENE) 1 % cream APPLY TO THE AFFECTED AREAS TWICE DAILY FOR 7 DAYS  0  . Spacer/Aero-Holding Chambers (OPTICHAMBER DIAMOND) MISC USE AS DIRECTED WITH ALBUTEROL INHALER  0  . SUMAtriptan (IMITREX) 100 MG tablet TAKE 1/2 TAB BY MOUTH AT ONSET OF HEADACHE MAX. DAILY DOSE: 1 TAB  1  . aspirin 81 MG chewable tablet Chew 81 mg by mouth.    Marland Kitchen aspirin 81 MG chewable tablet Chew by mouth.    Marland Kitchen buPROPion (WELLBUTRIN XL) 150 MG 24 hr tablet TAKE 1 TAB BY MOUTH EVERY MORNING FOR 90 DAYS  1   No current facility-administered medications for this visit.     Allergies as of 04/15/2018 - Review Complete 04/15/2018  Allergen Reaction Noted  . Oxycodone Nausea And Vomiting 03/15/2017  .  Cat hair extract Other (See Comments) 06/05/2014    ROS:  General: Negative for anorexia, weight loss, fever, chills, fatigue, weakness. ENT: Negative for hoarseness, difficulty swallowing , nasal congestion. CV: Negative for chest pain, angina, palpitations, dyspnea on exertion, peripheral edema.  Respiratory: Negative for dyspnea at rest, dyspnea on exertion, cough, sputum, wheezing.  GI: See history of present illness. GU:  Negative for dysuria, hematuria, urinary incontinence, urinary frequency, nocturnal urination.  Endo: Negative for unusual weight change.    Physical Examination:   BP 105/70   Pulse 61   Temp 98 F (36.7 C) (Oral)   Ht 5' 7.5" (1.715 m)   Wt 137 lb 9.6 oz (62.4 kg)    BMI 21.23 kg/m   General: Well-nourished, well-developed in no acute distress.  Eyes: No icterus. Conjunctivae pink. Mouth: Oropharyngeal mucosa moist and pink , no lesions erythema or exudate. Neck: Supple, Trachea midline Abdomen: Bowel sounds are normal, nontender, nondistended, no hepatosplenomegaly or masses, no abdominal bruits or hernia , no rebound or guarding.   Extremities: No lower extremity edema. No clubbing or deformities. Neuro: Alert and oriented x 3.  Grossly intact. Skin: Warm and dry, no jaundice.   Psych: Alert and cooperative, normal mood and affect.   [facility]  [facility] labs in care everywhere reviewed  Imaging Studies: No results found.  Assessment and Plan:   Sara Gomez is a 22 y.o. y/o female with intermittent bloating, with negative H. pylori, and no symptom relief with PPI or MiraLAX daily  Due to ongoing symptoms, will repeat labs, to rule out any anemia which would suggest celiac disease We will also do celiac testing Due to intermittent narcotic use, small bowel bacterial overgrowth can also cause bloating We will check fecal fat to see if it suggests malabsorption We will also check ultrasound abdomen and pelvis  Her symptoms are likely functional This was discussed with the patient and family We have given her FODMAP diet handout to, to try to identify any triggers that are causing her symptoms.  Continue food diary  Continue to minimize narcotics Primary care physician, please ensure patient's GYN exam is up-to-date, and refer her for the same if not.  Dr Sara BouillonVarnita Jilliann Gomez

## 2018-04-16 ENCOUNTER — Ambulatory Visit: Payer: BLUE CROSS/BLUE SHIELD | Attending: Orthopedic Surgery | Admitting: Physical Therapy

## 2018-04-16 ENCOUNTER — Encounter: Payer: Self-pay | Admitting: Physical Therapy

## 2018-04-16 DIAGNOSIS — M25511 Pain in right shoulder: Secondary | ICD-10-CM | POA: Diagnosis present

## 2018-04-16 DIAGNOSIS — M6281 Muscle weakness (generalized): Secondary | ICD-10-CM | POA: Insufficient documentation

## 2018-04-16 DIAGNOSIS — M25611 Stiffness of right shoulder, not elsewhere classified: Secondary | ICD-10-CM | POA: Diagnosis present

## 2018-04-16 DIAGNOSIS — M25512 Pain in left shoulder: Secondary | ICD-10-CM | POA: Insufficient documentation

## 2018-04-16 DIAGNOSIS — G8929 Other chronic pain: Secondary | ICD-10-CM | POA: Diagnosis present

## 2018-04-16 DIAGNOSIS — M25612 Stiffness of left shoulder, not elsewhere classified: Secondary | ICD-10-CM | POA: Diagnosis present

## 2018-04-20 NOTE — Therapy (Addendum)
[place] [facility] [address] [place], [place], [address] Phone: [phone]   Fax:  [phone]  Physical Therapy Evaluation  Patient Details  Name: Sara Gomez MRN: [id number] Date of Birth: 06/29/1996 Referring Provider: Dr. Roney Mans   Encounter Date: 04/16/2018  PT End of Session - 04/23/18 0726    Visit Number  1    Number of Visits  16    Date for PT Re-Evaluation  06/11/18    PT Start Time  0813    PT Stop Time  0912    PT Time Calculation (min)  59 min    Activity Tolerance  Patient tolerated treatment well;Patient limited by pain    Behavior During Therapy  [facility] for tasks assessed/performed       History reviewed. No pertinent past medical history.  History reviewed. No pertinent surgical history.  There were no vitals filed for this visit.   Subjective Assessment - 04/23/18 0713    Subjective  Pt. s/p R shoulder Latarjet 2 weeks ago (04/02/18).  Pt. entered PT with use of sling and MD protocol.  Pt. had a previous L shoulder Latarjet procedure last year.        Pertinent History  See previous PMHx.  Pt. known well to PT clinic.      Limitations  Lifting;Standing;Writing;House hold activities    Patient Stated Goals  Increase R and L shoulder ROM/ strength to improve pain-free mobility.      Currently in Pain?  Yes    Pain Score  4     Pain Location  Shoulder    Pain Orientation  Right    Pain Descriptors / Indicators  Aching    Pain Type  Surgical pain         [facility] PT Assessment - 04/23/18 0001      Assessment   Medical Diagnosis  s/p R Latarjet procedure/ L shoulder pain    Referring Provider  Dr. Roney Mans    Onset Date/Surgical Date  04/02/18    Hand Dominance  Right    Next MD Visit  05/10/18    Prior Therapy  yes      Prior Function   Level of Independence  Independent        Discussed MD protocol.  L shoulder manual isometrics/ AAROM all planes.      PT Education - 04/23/18 0726     Education provided  Yes    Education Details  Discussed HEP/ MD protocol.      Person(s) Educated  Patient    Methods  Explanation;Demonstration;Handout    Comprehension  Verbalized understanding;Returned demonstration          PT Long Term Goals - 04/23/18 0800      PT LONG TERM GOAL #1   Title  Pt. I with HEP to increase R shoulder AROM to [facility] as compared to L shoulder to improve pain-free mobility.      Baseline  Supine L shoulder AROM: flexion 146 deg., ER 36 deg. (joint stiffness/ pain), abd. 124 deg., IR 84 deg. No R shoulder active or passive ROM at this time (pt. can start PT tx. at 3 weeks post-surgery/ start 04/23/18).    Time  8    Period  Weeks    Status  New    Target Date  06/11/18      PT LONG TERM GOAL #2   Title  Pt. will increase R deltoid/ biceps/ triceps strength to grossly 4/5  MMT to improve return to household tasks/ running.     Baseline  L shoulder strength grossly 3+/5 MMT, biceps/triceps 4/5 MMT.  No R shoulder ROM/ strength testing at this time.      Time  8    Period  Weeks    Status  New    Target Date  06/11/18      PT LONG TERM GOAL #3   Title  Pt. will increase FOTO to 64 to improve R shoulder mobility.      Baseline  FOTO: baseling 42/ goal 64    Time  8    Period  Weeks    Status  New    Target Date  06/11/18      PT LONG TERM GOAL #4   Title  Pt. able to complete all work related tasks with no R shoulder limitations or pain to promote return to PLOF.      Baseline  Pt. not working at this time.      Time  8    Period  Weeks    Status  New    Target Date  06/11/18          Plan - 04/23/18 0727    Clinical Impression Statement  Pt. is a pleasant 22 y/o female s/p R Latarjet procedure on 04/02/18.  Pt. known well to PT secondary to recent multiple L and R sh. shoulder issues/surgeries.  Pt. arrived to PT with use of sling/ MD protocol.  Pt. not sleeping well at night and reports 4/10 R shoulder pain at rest.  PT explained MD protocol  and importance of compliance with HEP to improve R shoulder recovery.  Supine L shoulder AROM:  flexion 146 deg., ER 36 deg. (joint stiffness/ pain), abd. 124 deg., IR 84 deg.  No R shoulder active or passive ROM at this time (pt. can start PT tx. at 3 weeks post-surgery/ start 04/23/18).  FOTO: baseline 42/ goal 64.  Pt. presents with good incision healing with mild tenderness with palpation around distal aspect of incision.  Pt. will benefit from skilled PT services to increase R and L shoulder AROM/ strength to improve functional mobility.       Clinical Presentation  Stable    Clinical Decision Making  Moderate    Rehab Potential  Excellent    PT Frequency  2x / week    PT Duration  8 weeks    PT Treatment/Interventions  ADLs/Self Care Home Management;Aquatic Therapy;Cryotherapy;Electrical Stimulation;Therapeutic exercise;Patient/family education;Neuromuscular re-education;Manual techniques;Scar mobilization;Passive range of motion    PT Next Visit Plan  follow MD protocol.      PT Home Exercise Plan  See handouts       Patient will benefit from skilled therapeutic intervention in order to improve the following deficits and impairments:  Pain, Decreased mobility, Decreased scar mobility, Hypomobility, Decreased strength, Decreased range of motion, Decreased endurance, Decreased activity tolerance, Impaired flexibility  Visit Diagnosis: Acute pain of right shoulder  Shoulder joint stiffness, right  Muscle weakness (generalized)  Chronic left shoulder pain     Problem List Patient Active Problem List   Diagnosis Date Noted  . Headache 12/18/2012  . Hypothermia 12/18/2012   Cammie McgeeMichael C Sherk, PT, DPT # [id number] 04/23/2018, 8:12 AM  [place] [facility] [address] [place], [place], [address] Phone: [phone]   Fax:  [phone]  Name: Sara Gomez MRN: [id number] Date of Birth: 06/20/1996

## 2018-04-23 ENCOUNTER — Ambulatory Visit: Payer: BLUE CROSS/BLUE SHIELD | Admitting: Physical Therapy

## 2018-04-23 ENCOUNTER — Encounter: Payer: Self-pay | Admitting: Physical Therapy

## 2018-04-23 DIAGNOSIS — M6281 Muscle weakness (generalized): Secondary | ICD-10-CM

## 2018-04-23 DIAGNOSIS — M25511 Pain in right shoulder: Secondary | ICD-10-CM | POA: Diagnosis not present

## 2018-04-23 DIAGNOSIS — G8929 Other chronic pain: Secondary | ICD-10-CM

## 2018-04-23 DIAGNOSIS — M25512 Pain in left shoulder: Secondary | ICD-10-CM

## 2018-04-23 DIAGNOSIS — M25611 Stiffness of right shoulder, not elsewhere classified: Secondary | ICD-10-CM

## 2018-04-23 NOTE — Addendum Note (Signed)
Addended by: Cammie McgeeSHERK, MICHAEL C on: 04/23/2018 08:15 AM   Modules accepted: Orders

## 2018-04-23 NOTE — Therapy (Signed)
[place] [facility] [address]. [place], [place], [address] Phone: [phone]   Fax:  [phone]  Physical Therapy Treatment  Patient Details  Name: Sara Gomez MRN: [id number] Date of Birth: 24-Dec-1995 Referring Provider: Dr. Roney Mans   Encounter Date: 04/23/2018  PT End of Session - 04/23/18 1315    Visit Number  2    Number of Visits  16    Date for PT Re-Evaluation  06/11/18    PT Start Time  0812    PT Stop Time  0907    PT Time Calculation (min)  55 min    Activity Tolerance  Patient tolerated treatment well;Patient limited by pain    Behavior During Therapy  [facility]: Lambert Rhonda W for tasks assessed/performed       History reviewed. No pertinent past medical history.  History reviewed. No pertinent surgical history.  There were no vitals filed for this visit.  Subjective Assessment - 04/23/18 1313    Subjective  Pt. arrived to PT with use of R shoulder sling.  Pt. reports minimal R sh. pain currently at rest (2-3/10).      Pertinent History  See previous PMHx.  Pt. known well to PT clinic.      Limitations  Lifting;Standing;Writing;House hold activities    Patient Stated Goals  Increase R and L shoulder ROM/ strength to improve pain-free mobility.      Currently in Pain?  Yes    Pain Score  3     Pain Location  Shoulder    Pain Orientation  Right    Pain Descriptors / Indicators  Aching    Pain Type  Surgical pain         [facility] PT Assessment - 04/23/18 0001      Assessment   Medical Diagnosis  s/p R Latarjet procedure/ L shoulder pain    Referring Provider  Dr. Roney Mans    Onset Date/Surgical Date  04/02/18    Hand Dominance  Right    Next MD Visit  05/10/18    Prior Therapy  yes      Prior Function   Level of Independence  Independent         Treatment:  There.ex.:  Supine L shoulder stretches (all planes).  Supine L shoulder rhythmic stabs all planes 3x30 sec. Each.  L shoulder isometrics (flexion/ abd./ ER/ IR)  5x each with moderate manual resistance.    Manual tx.:  Supine R shoulder PROM 10x2 (all planes of movement).   Discussed/ demonstrated scar massage to R sh. Incision.   Supine cervical UT/levator/ traction stretches 5x each.    Pt. Instructed to ice R shoulder at home.    PT Education - 04/23/18 0726    Education provided  Yes    Education Details  Discussed HEP/ MD protocol.      Person(s) Educated  Patient    Methods  Explanation;Demonstration;Handout    Comprehension  Verbalized understanding;Returned demonstration          PT Long Term Goals - 04/23/18 0800      PT LONG TERM GOAL #1   Title  Pt. I with HEP to increase R shoulder AROM to [facility] as compared to L shoulder to improve pain-free mobility.      Baseline  Supine L shoulder AROM: flexion 146 deg., ER 36 deg. (joint stiffness/ pain), abd. 124 deg., IR 84 deg. No R shoulder active or passive ROM at this time (pt. can start PT tx. at  3 weeks post-surgery/ start 04/23/18).    Time  8    Period  Weeks    Status  New    Target Date  06/11/18      PT LONG TERM GOAL #2   Title  Pt. will increase R deltoid/ biceps/ triceps strength to grossly 4/5 MMT to improve return to household tasks/ running.     Baseline  L shoulder strength grossly 3+/5 MMT, biceps/triceps 4/5 MMT.  No R shoulder ROM/ strength testing at this time.      Time  8    Period  Weeks    Status  New    Target Date  06/11/18      PT LONG TERM GOAL #3   Title  Pt. will increase FOTO to 64 to improve R shoulder mobility.      Baseline  FOTO: baseling 42/ goal 64    Time  8    Period  Weeks    Status  New    Target Date  06/11/18      PT LONG TERM GOAL #4   Title  Pt. able to complete all work related tasks with no R shoulder limitations or pain to promote return to PLOF.      Baseline  Pt. not working at this time.      Time  8    Period  Weeks    Status  New    Target Date  06/11/18        Plan - 04/23/18 1319    Clinical Impression  Statement  Pt. started R shoulder PROM in supine position per MD protocol:  flexion (90 deg.), abd. (45 deg.), ER -5 deg. (pain/ muscle guarded).  Good L shoulder AROM in supine and benefits from generalized PT stretches.  B UT/cervical muscle tightness reported with good ROM noted in all planes.  Pt. progressing well with L shoulder manual stability/ isometric ex. program.      Clinical Presentation  Stable    Clinical Decision Making  Moderate    Rehab Potential  Excellent    PT Frequency  2x / week    PT Duration  8 weeks    PT Treatment/Interventions  ADLs/Self Care Home Management;Aquatic Therapy;Cryotherapy;Electrical Stimulation;Therapeutic exercise;Patient/family education;Neuromuscular re-education;Manual techniques;Scar mobilization;Passive range of motion    PT Next Visit Plan  follow MD protocol.      PT Home Exercise Plan  See handouts       Patient will benefit from skilled therapeutic intervention in order to improve the following deficits and impairments:  Pain, Decreased mobility, Decreased scar mobility, Hypomobility, Decreased strength, Decreased range of motion, Decreased endurance, Decreased activity tolerance, Impaired flexibility  Visit Diagnosis: Acute pain of right shoulder  Shoulder joint stiffness, right  Muscle weakness (generalized)  Chronic left shoulder pain     Problem List Patient Active Problem List   Diagnosis Date Noted  . Headache 12/18/2012  . Hypothermia 12/18/2012   Cammie McgeeMichael C Sherk, PT, DPT # [id number] 04/23/2018, 1:26 PM  [place] [facility] [address] [place], [place], [address] Phone: [phone]   Fax:  [phone]  Name: Sara Gomez MRN: [id number] Date of Birth: 09/15/1996

## 2018-04-24 ENCOUNTER — Other Ambulatory Visit
Admission: RE | Admit: 2018-04-24 | Discharge: 2018-04-24 | Disposition: A | Discharge status: Home / Self Care | Payer: BLUE CROSS/BLUE SHIELD | Source: Ambulatory Visit | Attending: Gastroenterology | Admitting: Gastroenterology

## 2018-04-24 ENCOUNTER — Ambulatory Visit
Admission: RE | Admit: 2018-04-24 | Discharge: 2018-04-24 | Disposition: A | Discharge status: Home / Self Care | Payer: BLUE CROSS/BLUE SHIELD | Source: Ambulatory Visit | Attending: Gastroenterology | Admitting: Gastroenterology

## 2018-04-24 DIAGNOSIS — R140 Abdominal distension (gaseous): Secondary | ICD-10-CM | POA: Insufficient documentation

## 2018-04-24 DIAGNOSIS — R1013 Epigastric pain: Secondary | ICD-10-CM | POA: Diagnosis not present

## 2018-04-24 LAB — COMPREHENSIVE METABOLIC PANEL
ALK PHOS: 85 U/L (ref 38–126)
ALT: 11 U/L — AB (ref 14–54)
AST: 20 U/L (ref 15–41)
Albumin: 3.8 g/dL (ref 3.5–5.0)
Anion gap: 10 (ref 5–15)
BUN: 11 mg/dL (ref 6–20)
CALCIUM: 9.1 mg/dL (ref 8.9–10.3)
CHLORIDE: 103 mmol/L (ref 101–111)
CO2: 26 mmol/L (ref 22–32)
CREATININE: 0.62 mg/dL (ref 0.44–1.00)
Glucose, Bld: 82 mg/dL (ref 65–99)
Potassium: 4.4 mmol/L (ref 3.5–5.1)
Sodium: 139 mmol/L (ref 135–145)
Total Bilirubin: 0.2 mg/dL — ABNORMAL LOW (ref 0.3–1.2)
Total Protein: 6.8 g/dL (ref 6.5–8.1)

## 2018-04-24 LAB — C-REACTIVE PROTEIN

## 2018-04-24 LAB — CBC
HEMATOCRIT: 40.4 % (ref 35.0–47.0)
HEMOGLOBIN: 13.3 g/dL (ref 12.0–16.0)
MCH: 29.8 pg (ref 26.0–34.0)
MCHC: 33 g/dL (ref 32.0–36.0)
MCV: 90.4 fL (ref 80.0–100.0)
PLATELETS: 328 10*3/uL (ref 150–440)
RBC: 4.47 MIL/uL (ref 3.80–5.20)
RDW: 12.8 % (ref 11.5–14.5)
WBC: 5.4 10*3/uL (ref 3.6–11.0)

## 2018-04-25 LAB — IGG: IgG (Immunoglobin G), Serum: 871 mg/dL (ref 549–1584)

## 2018-04-25 LAB — GLIA (IGA/G) + TTG IGA
Antigliadin Abs, IgA: 4 units (ref 0–19)
Gliadin IgG: 2 units (ref 0–19)

## 2018-04-25 LAB — TISSUE TRANSGLUTAMINASE, IGG: Tissue Transglut Ab: 4 U/mL (ref 0–5)

## 2018-04-25 LAB — IGA: IGA: 166 mg/dL (ref 87–352)

## 2018-04-29 LAB — FECAL FAT, QUALITATIVE
FAT QUAL TOTAL STL: NORMAL
Fat Qual Neutral, Stl: NORMAL

## 2018-04-30 ENCOUNTER — Ambulatory Visit: Payer: BLUE CROSS/BLUE SHIELD | Admitting: Physical Therapy

## 2018-04-30 ENCOUNTER — Encounter: Payer: Self-pay | Admitting: Physical Therapy

## 2018-04-30 DIAGNOSIS — G8929 Other chronic pain: Secondary | ICD-10-CM

## 2018-04-30 DIAGNOSIS — M25612 Stiffness of left shoulder, not elsewhere classified: Secondary | ICD-10-CM

## 2018-04-30 DIAGNOSIS — M25512 Pain in left shoulder: Secondary | ICD-10-CM

## 2018-04-30 DIAGNOSIS — M6281 Muscle weakness (generalized): Secondary | ICD-10-CM

## 2018-04-30 DIAGNOSIS — M25511 Pain in right shoulder: Secondary | ICD-10-CM | POA: Diagnosis not present

## 2018-04-30 DIAGNOSIS — M25611 Stiffness of right shoulder, not elsewhere classified: Secondary | ICD-10-CM

## 2018-04-30 NOTE — Therapy (Signed)
[facility] [address] [place], [place], [address] Phone: [phone]   Fax:  [phone]  Physical Therapy Treatment  Patient Details  Name: Sara Gomez MRN: [id number] Date of Birth: [birth year] Referring Provider: Dr. Roney Mans   Encounter Date: 04/30/2018  PT End of Session - 04/30/18 0744    Visit Number  3    Number of Visits  16    Date for PT Re-Evaluation  06/11/18    PT Start Time  0731    PT Stop Time  0820    PT Time Calculation (min)  49 min    Activity Tolerance  Patient tolerated treatment well;Patient limited by pain    Behavior During Therapy  [facility] for tasks assessed/performed       History reviewed. No pertinent past medical history.  History reviewed. No pertinent surgical history.  There were no vitals filed for this visit.  Subjective Assessment - 04/30/18 0739    Subjective  Pt. arrived to PT with R shoulder in sling.  Pt reports to have hurt arm over the weekend trying to catch her dog.  Pt. reports a 5/10 pain rating this AM, took pain medicine yesterday for pain, none this morning.    Pertinent History  See previous PMHx.  Pt. known well to PT clinic.      Limitations  Lifting;Standing;Writing;House hold activities    Patient Stated Goals  Increase R and L shoulder ROM/ strength to improve pain-free mobility.      Currently in Pain?  Yes    Pain Score  5     Pain Location  Shoulder    Pain Orientation  Right    Pain Descriptors / Indicators  Aching    Pain Type  Surgical pain        Treatment:  There.ex.:  Supine L shoulder stretches (all planes).   Supine L shoulder rhythmic stabs all planes 3x30 sec. each.   Supine L AAROM with wand in flexion, ER, and chest presses x20 Seated pulleys x20 L shoulder isometrics (flexion/ abd./ ER/ IR) 5x each with moderate manual resistance.     Manual tx.:  Supine R shoulder PROM 10x2 (all planes of movement).   Supine cervical UT/levator/  traction stretches 5x each.    Pt. Received ice on R shoulder to decrease pain.       PT Long Term Goals - 04/23/18 0800      PT LONG TERM GOAL #1   Title  Pt. I with HEP to increase R shoulder AROM to [facility] as compared to L shoulder to improve pain-free mobility.      Baseline  Supine L shoulder AROM: flexion 146 deg., ER 36 deg. (joint stiffness/ pain), abd. 124 deg., IR 84 deg. No R shoulder active or passive ROM at this time (pt. can start PT tx. at 3 weeks post-surgery/ start 04/23/18).    Time  8    Period  Weeks    Status  New    Target Date  06/11/18      PT LONG TERM GOAL #2   Title  Pt. will increase R deltoid/ biceps/ triceps strength to grossly 4/5 MMT to improve return to household tasks/ running.     Baseline  L shoulder strength grossly 3+/5 MMT, biceps/triceps 4/5 MMT.  No R shoulder ROM/ strength testing at this time.      Time  8    Period  Weeks    Status  New  Target Date  06/11/18      PT LONG TERM GOAL #3   Title  Pt. will increase FOTO to 64 to improve R shoulder mobility.      Baseline  FOTO: baseling 42/ goal 64    Time  8    Period  Weeks    Status  New    Target Date  06/11/18      PT LONG TERM GOAL #4   Title  Pt. able to complete all work related tasks with no R shoulder limitations or pain to promote return to PLOF.      Baseline  Pt. not working at this time.      Time  8    Period  Weeks    Status  New    Target Date  06/11/18            Plan - 04/30/18 0747    Clinical Impression Statement  Pt. performed AAROM with wand in supine position per MD protocol: ER (<10 deg), flexion (90 deg).  Pt. educated on wearing brace to prevent injuries and only taking off for hygiene and ther ex.  Pt. reports increased tightness in UT and cervical region and responded well to [facility] area.  Pt. to continue progressing per MD protocol for L and R shoulder.    Clinical Presentation  Stable    Clinical Decision Making  Moderate    Rehab Potential   Excellent    PT Frequency  2x / week    PT Duration  8 weeks    PT Treatment/Interventions  ADLs/Self Care Home Management;Aquatic Therapy;Cryotherapy;Electrical Stimulation;Therapeutic exercise;Patient/family education;Neuromuscular re-education;Manual techniques;Scar mobilization;Passive range of motion    PT Next Visit Plan  follow MD protocol.      PT Home Exercise Plan  See handouts       Patient will benefit from skilled therapeutic intervention in order to improve the following deficits and impairments:  Pain, Decreased mobility, Decreased scar mobility, Hypomobility, Decreased strength, Decreased range of motion, Decreased endurance, Decreased activity tolerance, Impaired flexibility  Visit Diagnosis: Acute pain of right shoulder  Shoulder joint stiffness, right  Muscle weakness (generalized)  Chronic left shoulder pain  Shoulder joint stiffness, left  Acute pain of left shoulder     Problem List Patient Active Problem List   Diagnosis Date Noted  . Headache 12/18/2012  . Hypothermia 12/18/2012    Cammie McgeeMichael C Sherk, PT, DPT # [id number] Tomasa HoseJosh Tamisha Nordstrom, SPT 04/30/2018, 10:37 AM  [place] [facility] [address] [place], [place], [address] Phone: [phone]   Fax:  [phone]  Name: Sara GullyChloe A Gomez MRN: [id number] Date of Birth: 07/01/1996

## 2018-05-03 LAB — PANCREATIC ELASTASE, FECAL

## 2018-05-06 ENCOUNTER — Ambulatory Visit: Payer: BLUE CROSS/BLUE SHIELD | Admitting: Physical Therapy

## 2018-05-06 ENCOUNTER — Encounter: Payer: Self-pay | Admitting: Physical Therapy

## 2018-05-06 DIAGNOSIS — M25512 Pain in left shoulder: Secondary | ICD-10-CM

## 2018-05-06 DIAGNOSIS — M25511 Pain in right shoulder: Secondary | ICD-10-CM | POA: Diagnosis not present

## 2018-05-06 DIAGNOSIS — G8929 Other chronic pain: Secondary | ICD-10-CM

## 2018-05-06 DIAGNOSIS — M6281 Muscle weakness (generalized): Secondary | ICD-10-CM

## 2018-05-06 DIAGNOSIS — M25611 Stiffness of right shoulder, not elsewhere classified: Secondary | ICD-10-CM

## 2018-05-08 ENCOUNTER — Encounter: Payer: BLUE CROSS/BLUE SHIELD | Admitting: Physical Therapy

## 2018-05-08 NOTE — Therapy (Signed)
[place] [facility] [address] [place], [place], [address] Phone: [phone]   Fax:  [phone]  Physical Therapy Treatment  Patient Details  Name: Sara Gomez MRN: [id number] Date of Birth: 05/17/1996 Referring Provider: Dr. Roney Mansreighton   Encounter Date: 05/06/2018    Treatment 4 of 16.  Recert date: 06/11/18   History reviewed. No pertinent past medical history.  History reviewed. No pertinent surgical history.  There were no vitals filed for this visit.    Pt. continues to use R shoulder sling with everyday tasks and discussed weaning off the sling over the next week. Pt. returns to MD this Friday. Less pain today in R shoulder as compared to previous PT tx. session.       Treatment:  There.ex.:  Seated sh. Pulley flexion/ abd. (20x each).   Supine R and L shoulder stretches (all planes)- per MD protocol on R.  Supine L shoulder rhythmic stabs all planes 3x30 sec. each.  Moderate resistance Supine L shoulder isometrics (flexion/ abd./ ER/ IR) 5x each with moderate manual resistance. Supine R sh. Light isometrics at neutral sh. Posture (discussed HEP).     Manual tx.:  Supine R shoulder AA/PROM 10x2 (all planes of movement)- per MD protocol.  STM to R sh./ incision/ B UT region (5 min.).    Pt. Received ice on R shoulder in seated posture to decrease pain/ inflammation after tx. session.     PT Long Term Goals - 04/23/18 0800      PT LONG TERM GOAL #1   Title  Pt. I with HEP to increase R shoulder AROM to [facility] as compared to L shoulder to improve pain-free mobility.      Baseline  Supine L shoulder AROM: flexion 146 deg., ER 36 deg. (joint stiffness/ pain), abd. 124 deg., IR 84 deg. No R shoulder active or passive ROM at this time (pt. can start PT tx. at 3 weeks post-surgery/ start 04/23/18).    Time  8    Period  Weeks    Status  New    Target Date  06/11/18      PT LONG TERM GOAL #2   Title  Pt.  will increase R deltoid/ biceps/ triceps strength to grossly 4/5 MMT to improve return to household tasks/ running.     Baseline  L shoulder strength grossly 3+/5 MMT, biceps/triceps 4/5 MMT.  No R shoulder ROM/ strength testing at this time.      Time  8    Period  Weeks    Status  New    Target Date  06/11/18      PT LONG TERM GOAL #3   Title  Pt. will increase FOTO to 64 to improve R shoulder mobility.      Baseline  FOTO: baseling 42/ goal 64    Time  8    Period  Weeks    Status  New    Target Date  06/11/18      PT LONG TERM GOAL #4   Title  Pt. able to complete all work related tasks with no R shoulder limitations or pain to promote return to PLOF.      Baseline  Pt. not working at this time.      Time  8    Period  Weeks    Status  New    Target Date  06/11/18        Plan - 05/08/18 [id number]  Clinical Impression Statement  Pt. continues to present with >90 deg. R sh. flexion/ >45 deg. abd. but only 12 deg. ER.  Significant R sh. hypomobility with ER movement pattern.  Tenderness over R incision with palpation with good healing noted.  Progressing well with L shoulder stabilization ex. program.  PT discussed the importance of compliance with HEP and benefits of R sh. isometrics/ L shoulder generalized strengthening at this time.      Clinical Presentation  Stable    Clinical Decision Making  Low    Rehab Potential  Excellent    PT Frequency  2x / week    PT Duration  8 weeks    PT Treatment/Interventions  ADLs/Self Care Home Management;Aquatic Therapy;Cryotherapy;Electrical Stimulation;Therapeutic exercise;Patient/family education;Neuromuscular re-education;Manual techniques;Scar mobilization;Passive range of motion    PT Next Visit Plan  follow MD protocol.  Discuss MD f/u visit.  Wean off the sling.      PT Home Exercise Plan  See handouts       Patient will benefit from skilled therapeutic intervention in order to improve the following deficits and impairments:  Pain,  Decreased mobility, Decreased scar mobility, Hypomobility, Decreased strength, Decreased range of motion, Decreased endurance, Decreased activity tolerance, Impaired flexibility  Visit Diagnosis: Acute pain of right shoulder  Shoulder joint stiffness, right  Muscle weakness (generalized)  Chronic left shoulder pain     Problem List Patient Active Problem List   Diagnosis Date Noted  . Headache 12/18/2012  . Hypothermia 12/18/2012   [name], PT, DPT # [phone] 05/08/2018, 9:15 AM  [place] [facility] [address] [place], [place], [address] Phone: [phone]   Fax:  [phone]  Name: Sara Gomez MRN: [id number] Date of Birth: Sep 01, 1996

## 2018-05-13 ENCOUNTER — Other Ambulatory Visit: Payer: Self-pay | Admitting: Gastroenterology

## 2018-05-13 ENCOUNTER — Encounter: Payer: Self-pay | Admitting: Physical Therapy

## 2018-05-13 ENCOUNTER — Ambulatory Visit: Payer: BLUE CROSS/BLUE SHIELD | Attending: Orthopedic Surgery | Admitting: Physical Therapy

## 2018-05-13 DIAGNOSIS — G8929 Other chronic pain: Secondary | ICD-10-CM | POA: Insufficient documentation

## 2018-05-13 DIAGNOSIS — M25511 Pain in right shoulder: Secondary | ICD-10-CM | POA: Insufficient documentation

## 2018-05-13 DIAGNOSIS — M25512 Pain in left shoulder: Secondary | ICD-10-CM | POA: Diagnosis present

## 2018-05-13 DIAGNOSIS — M25611 Stiffness of right shoulder, not elsewhere classified: Secondary | ICD-10-CM | POA: Diagnosis present

## 2018-05-13 DIAGNOSIS — R112 Nausea with vomiting, unspecified: Secondary | ICD-10-CM

## 2018-05-13 DIAGNOSIS — M6281 Muscle weakness (generalized): Secondary | ICD-10-CM | POA: Diagnosis present

## 2018-05-13 DIAGNOSIS — M25612 Stiffness of left shoulder, not elsewhere classified: Secondary | ICD-10-CM | POA: Diagnosis present

## 2018-05-13 NOTE — Therapy (Signed)
[facility] [address] [place], [place], [address] Phone: [phone]   Fax:  [phone]  Physical Therapy Treatment  Patient Details  Name: Sara Gomez MRN: [id number] Date of Birth: 05/26/1996 Referring Provider: Dr. Roney Mansreighton   Encounter Date: 05/13/2018  PT End of Session - 05/13/18 1018    Visit Number  5    Number of Visits  16    Date for PT Re-Evaluation  06/11/18    PT Start Time  0740    PT Stop Time  0836    PT Time Calculation (min)  56 min    Activity Tolerance  Patient tolerated treatment well;Patient limited by pain    Behavior During Therapy  [facility] for tasks assessed/performed       History reviewed. No pertinent past medical history.  History reviewed. No pertinent surgical history.  There were no vitals filed for this visit.  Subjective Assessment - 05/13/18 1005    Subjective  Pt. is no longer wearing sling for activities.  Pt. reports that MD has cleared her for trying out with the [employer] [facility] in late August.  Pt. is motivated to get shoulder stronger for try-outs approaching in next couple months.    Pertinent History  See previous PMHx.  Pt. known well to PT clinic.      Limitations  Lifting;Standing;Writing;House hold activities    Patient Stated Goals  Increase R and L shoulder ROM/ strength to improve pain-free mobility.      Currently in Pain?  Yes    Pain Score  2     Pain Location  Shoulder    Pain Orientation  Right    Pain Descriptors / Indicators  Aching    Pain Type  Surgical pain        Treatment:  There.ex.:  Seated sh. Pulley flexion/ abd. (20x each).   Supine R and L shoulder stretches (all planes)- per MD protocol on R.  Supine L shoulder rhythmic stabs all planes 3x30 sec.each with 3# weight. Moderate resistance Supine L shoulder isometrics (flexion/ abd./ ER/ IR) 5x each with moderate manual resistance. Supine R sh. light isometrics at neutral sh.  (discussed HEP).   Supine AAROM of R shoulder AAROM (ER, Flex, Abduction) with wand.   Manual tx.:  Supine R shoulder AA/PROM 10x2 (all planes of movement)- per MD protocol.  STM to R sh./ incision/ B UT region (5 min.).           PT Long Term Goals - 04/23/18 0800      PT LONG TERM GOAL #1   Title  Pt. I with HEP to increase [facility] shoulder [facility] as compared to L shoulder to improve pain-free mobility.      Baseline  Supine L shoulder AROM: flexion 146 deg., ER 36 deg. (joint stiffness/ pain), abd. 124 deg., IR 84 deg. No R shoulder active or passive ROM at this time (pt. can start PT tx. at 3 weeks post-surgery/ start 04/23/18).    Time  8    Period  Weeks    Status  New    Target Date  06/11/18      PT LONG TERM GOAL #2   Title  Pt. will increase R deltoid/ biceps/ triceps strength to grossly 4/5 MMT to improve return to household tasks/ running.     Baseline  L shoulder strength grossly 3+/5 MMT, biceps/triceps 4/5 MMT.  No R shoulder ROM/ strength testing at  this time.      Time  8    Period  Weeks    Status  New    Target Date  06/11/18      PT LONG TERM GOAL #3   Title  Pt. will increase FOTO to 64 to improve R shoulder mobility.      Baseline  FOTO: baseling 42/ goal 64    Time  8    Period  Weeks    Status  New    Target Date  06/11/18      PT LONG TERM GOAL #4   Title  Pt. able to complete all work related tasks with no R shoulder limitations or pain to promote return to PLOF.      Baseline  Pt. not working at this time.      Time  8    Period  Weeks    Status  New    Target Date  06/11/18            Plan - 05/13/18 1020    Clinical Impression Statement  Pt. continues to have decreased movement in R sh. ER.  Pt. tolerated PROM in R shoulder in all planes of movement.  Pt. able to progress with AAROM for R shoulder and increasing weight for stabilization exercises in L shoulder.  Pt. educated on importance of doing HEP in order to achieve  necessary movement of B shoulders.    Clinical Presentation  Stable    Clinical Decision Making  Low    Rehab Potential  Excellent    PT Frequency  2x / week    PT Duration  8 weeks    PT Treatment/Interventions  ADLs/Self Care Home Management;Aquatic Therapy;Cryotherapy;Electrical Stimulation;Therapeutic exercise;Patient/family education;Neuromuscular re-education;Manual techniques;Scar mobilization;Passive range of motion    PT Next Visit Plan  follow MD protocol.     PT Home Exercise Plan  See handouts       Patient will benefit from skilled therapeutic intervention in order to improve the following deficits and impairments:  Pain, Decreased mobility, Decreased scar mobility, Hypomobility, Decreased strength, Decreased range of motion, Decreased endurance, Decreased activity tolerance, Impaired flexibility  Visit Diagnosis: Acute pain of right shoulder  Shoulder joint stiffness, right  Muscle weakness (generalized)  Chronic left shoulder pain  Shoulder joint stiffness, left  Acute pain of left shoulder     Problem List Patient Active Problem List   Diagnosis Date Noted  . Headache 12/18/2012  . Hypothermia 12/18/2012   [name], PT, DPT # [id number] Tomasa Hose, SPT 05/13/2018, 2:05 PM  [place] [facility] [address] [place], [place], [address] Phone: [phone]   Fax:  [phone]  Name: Sara Gomez MRN: [id number] Date of Birth: 05/11/1996

## 2018-05-15 ENCOUNTER — Encounter: Payer: Self-pay | Admitting: Physical Therapy

## 2018-05-15 ENCOUNTER — Encounter: Payer: BLUE CROSS/BLUE SHIELD | Admitting: Physical Therapy

## 2018-05-15 ENCOUNTER — Ambulatory Visit: Payer: BLUE CROSS/BLUE SHIELD | Admitting: Physical Therapy

## 2018-05-15 DIAGNOSIS — M6281 Muscle weakness (generalized): Secondary | ICD-10-CM

## 2018-05-15 DIAGNOSIS — M25512 Pain in left shoulder: Secondary | ICD-10-CM

## 2018-05-15 DIAGNOSIS — G8929 Other chronic pain: Secondary | ICD-10-CM

## 2018-05-15 DIAGNOSIS — M25511 Pain in right shoulder: Secondary | ICD-10-CM

## 2018-05-15 DIAGNOSIS — M25612 Stiffness of left shoulder, not elsewhere classified: Secondary | ICD-10-CM

## 2018-05-15 DIAGNOSIS — M25611 Stiffness of right shoulder, not elsewhere classified: Secondary | ICD-10-CM

## 2018-05-15 NOTE — Therapy (Signed)
[place] [facility] [address] [place], [place], [address] Phone: [phone]   Fax:  [phone]  Physical Therapy Treatment  Patient Details  Name: Sara Gomez MRN: [id number] Date of Birth: 18-Sep-1996 Referring Provider: Dr. Roney Mans   Encounter Date: 05/15/2018  PT End of Session - 05/15/18 0848    Visit Number  6    Number of Visits  16    Date for PT Re-Evaluation  06/11/18    PT Start Time  0734    PT Stop Time  0832    PT Time Calculation (min)  58 min    Activity Tolerance  Patient tolerated treatment well;Patient limited by pain    Behavior During Therapy  [facility] for tasks assessed/performed       History reviewed. No pertinent past medical history.  History reviewed. No pertinent surgical history.  There were no vitals filed for this visit.  Subjective Assessment - 05/15/18 0741    Subjective  Pt. reports to be doing HEP, specifically IR/ER of the shoulder with elbow at side.  Pt. reports no new complaints upon arrival to clinic.    Pertinent History  See previous PMHx.  Pt. known well to PT clinic.      Limitations  Lifting;Standing;Writing;House hold activities    Patient Stated Goals  Increase R and L shoulder ROM/ strength to improve pain-free mobility.      Currently in Pain?  Yes    Pain Score  2     Pain Location  Shoulder    Pain Orientation  Right    Pain Descriptors / Indicators  Aching    Pain Type  Surgical pain         Treatment   Ther Ex:  Seated sh. pulley flexion/ abd. (20x each).    Supine R and L shoulder stretches (all planes) - per MD protocol on R.   Supine B shoulder rhythmic stabs all planes 3x30 sec. each with 3# weight on L, no weight on R Supine B shoulder isometrics (flexion/ abd./ ER/ IR) 5x each with moderate manual resistance. Supine R sh. Light isometrics at neutral shoulder position. Supine Dead Bugs x30sec Seated Dead Bugs on grey yoga ball 2x30 sec.   Manual:  Gentle  stretching of B shoulder in all planes of movement Gentle B shoulder mobilizations in all planes of movement       PT Long Term Goals - 04/23/18 0800      PT LONG TERM GOAL #1   Title  Pt. I with HEP to increase R shoulder AROM to [facility] as compared to L shoulder to improve pain-free mobility.      Baseline  Supine L shoulder AROM: flexion 146 deg., ER 36 deg. (joint stiffness/ pain), abd. 124 deg., IR 84 deg. No R shoulder active or passive ROM at this time (pt. can start PT tx. at 3 weeks post-surgery/ start 04/23/18).    Time  8    Period  Weeks    Status  New    Target Date  06/11/18      PT LONG TERM GOAL #2   Title  Pt. will increase R deltoid/ biceps/ triceps strength to grossly 4/5 MMT to improve return to household tasks/ running.     Baseline  L shoulder strength grossly 3+/5 MMT, biceps/triceps 4/5 MMT.  No R shoulder ROM/ strength testing at this time.      Time  8    Period  Weeks  Status  New    Target Date  06/11/18      PT LONG TERM GOAL #3   Title  Pt. will increase FOTO to 64 to improve R shoulder mobility.      Baseline  FOTO: baseling 42/ goal 64    Time  8    Period  Weeks    Status  New    Target Date  06/11/18      PT LONG TERM GOAL #4   Title  Pt. able to complete all work related tasks with no R shoulder limitations or pain to promote return to PLOF.      Baseline  Pt. not working at this time.      Time  8    Period  Weeks    Status  New    Target Date  06/11/18            Plan - 05/15/18 0849    Clinical Impression Statement  Pt. is in phase III of protocol per MD.  Pt. progressed to performing isometric exercises and more AROM in R shoulder with treatment today.  Pt. reports slight discomfort in posterior shoulder with isometric contractions.  Pt. educated on going up until the discomfort, but not past it.  Pt. responded well with education and able to achieve quality contractions without increase in pain.  Pt. educated on new HEP of  isometric exercises with use of a wall.    Clinical Presentation  Stable    Clinical Decision Making  Low    Rehab Potential  Excellent    PT Frequency  2x / week    PT Duration  8 weeks    PT Treatment/Interventions  ADLs/Self Care Home Management;Aquatic Therapy;Cryotherapy;Electrical Stimulation;Therapeutic exercise;Patient/family education;Neuromuscular re-education;Manual techniques;Scar mobilization;Passive range of motion    PT Next Visit Plan  follow MD protocol.     PT Home Exercise Plan  See handouts       Patient will benefit from skilled therapeutic intervention in order to improve the following deficits and impairments:  Pain, Decreased mobility, Decreased scar mobility, Hypomobility, Decreased strength, Decreased range of motion, Decreased endurance, Decreased activity tolerance, Impaired flexibility  Visit Diagnosis: Acute pain of right shoulder  Shoulder joint stiffness, right  Muscle weakness (generalized)  Chronic left shoulder pain  Shoulder joint stiffness, left  Acute pain of left shoulder     Problem List Patient Active Problem List   Diagnosis Date Noted  . Headache 12/18/2012  . Hypothermia 12/18/2012   Cammie McgeeMichael C Sherk, PT, DPT # [id number] Tomasa HoseJosh Kaidyn Hernandes, SPT 05/15/2018, 10:10 AM  [place] [facility] [address] [place], [place], [address] Phone: [phone]   Fax:  [phone]  Name: Sara Gomez MRN: [id number] Date of Birth: 09/21/1996

## 2018-05-20 ENCOUNTER — Encounter: Payer: Self-pay | Admitting: Physical Therapy

## 2018-05-20 ENCOUNTER — Ambulatory Visit: Payer: BLUE CROSS/BLUE SHIELD | Admitting: Physical Therapy

## 2018-05-20 DIAGNOSIS — M25511 Pain in right shoulder: Secondary | ICD-10-CM | POA: Diagnosis not present

## 2018-05-20 DIAGNOSIS — M6281 Muscle weakness (generalized): Secondary | ICD-10-CM

## 2018-05-20 DIAGNOSIS — G8929 Other chronic pain: Secondary | ICD-10-CM

## 2018-05-20 DIAGNOSIS — M25612 Stiffness of left shoulder, not elsewhere classified: Secondary | ICD-10-CM

## 2018-05-20 DIAGNOSIS — M25611 Stiffness of right shoulder, not elsewhere classified: Secondary | ICD-10-CM

## 2018-05-20 DIAGNOSIS — M25512 Pain in left shoulder: Secondary | ICD-10-CM

## 2018-05-20 NOTE — Therapy (Signed)
[place] [facility] [address] [place], [place], [address] Phone: [phone]   Fax:  [phone]  Physical Therapy Treatment  Patient Details  Name: Sara Gomez MRN: [id number] Date of Birth: 06-Aug-1996 Referring Provider: Dr. Roney Mans   Encounter Date: 05/20/2018  PT End of Session - 05/20/18 1003    Visit Number  7    Number of Visits  16    Date for PT Re-Evaluation  06/11/18    PT Start Time  0733    PT Stop Time  0836    PT Time Calculation (min)  63 min    Activity Tolerance  Patient tolerated treatment well;Patient limited by pain    Behavior During Therapy  [facility] for tasks assessed/performed       History reviewed. No pertinent past medical history.  History reviewed. No pertinent surgical history.  There were no vitals filed for this visit.  Subjective Assessment - 05/20/18 0959    Subjective  Pt. reports to have performed isometrics while at work over the weekend.  Pt. reports pain in R shoulder at this time and didn't sleep well.  No issues with use of R shoulder at work this past weekend.      Pertinent History  See previous PMHx.  Pt. known well to PT clinic.      Limitations  Lifting;Standing;Writing;House hold activities    Patient Stated Goals  Increase R and L shoulder ROM/ strength to improve pain-free mobility.      Currently in Pain?  Yes    Pain Score  4     Pain Location  Shoulder    Pain Orientation  Right    Pain Descriptors / Indicators  Aching    Pain Type  Surgical pain    Pain Onset  More than a month ago    Pain Frequency  Intermittent          Ther Ex:   SupineR andL shoulder stretches (all planes) - per MD protocol on R.  Seated B Scapular Retraction with YTB 2x10 Seated B Tricep Curls with YTB 2x10 Seated B T's with YTB 2x10 Prone B shoulder extension/ horizontal abduction 10x2 each (light PT assist with R shoulder).  Reviewed HEP in depth.    Manual:  Gentle stretching of  B shoulder in all planes of movement Gentle B shoulder mobilizations in supine position (all planes of movement) Prone grade II-III mid-thoracic central/ unilateral mobs. 2x 20 sec. Each.    Ice to R shoulder in seated posture after tx.       PT Long Term Goals - 04/23/18 0800      PT LONG TERM GOAL #1   Title  Pt. I with HEP to increase R shoulder AROM to [facility] as compared to L shoulder to improve pain-free mobility.      Baseline  Supine L shoulder AROM: flexion 146 deg., ER 36 deg. (joint stiffness/ pain), abd. 124 deg., IR 84 deg. No R shoulder active or passive ROM at this time (pt. can start PT tx. at 3 weeks post-surgery/ start 04/23/18).    Time  8    Period  Weeks    Status  New    Target Date  06/11/18      PT LONG TERM GOAL #2   Title  Pt. will increase R deltoid/ biceps/ triceps strength to grossly 4/5 MMT to improve return to household tasks/ running.     Baseline  L shoulder strength grossly  3+/5 MMT, biceps/triceps 4/5 MMT.  No R shoulder ROM/ strength testing at this time.      Time  8    Period  Weeks    Status  New    Target Date  06/11/18      PT LONG TERM GOAL #3   Title  Pt. will increase FOTO to 64 to improve R shoulder mobility.      Baseline  FOTO: baseling 42/ goal 64    Time  8    Period  Weeks    Status  New    Target Date  06/11/18      PT LONG TERM GOAL #4   Title  Pt. able to complete all work related tasks with no R shoulder limitations or pain to promote return to PLOF.      Baseline  Pt. not working at this time.      Time  8    Period  Weeks    Status  New    Target Date  06/11/18            Plan - 05/20/18 1003    Clinical Impression Statement  Pt. progressed to use of YTB in order to promote strengthening of musculature and stability of shoulder per protocol.  Pt. tolerated new exercises well, while being educated on going to pain tolerable range.  Limited enthusiasm during ther.ex. and required constant cuing to remained focused/  motivated during tx. session.   Pt. will continue to progress per MD protocol with focus on increasing ROM and strength of shoulder capsule.    Clinical Presentation  Stable    Clinical Decision Making  Low    Rehab Potential  Excellent    PT Frequency  2x / week    PT Duration  8 weeks    PT Treatment/Interventions  ADLs/Self Care Home Management;Aquatic Therapy;Cryotherapy;Electrical Stimulation;Therapeutic exercise;Patient/family education;Neuromuscular re-education;Manual techniques;Scar mobilization;Passive range of motion    PT Next Visit Plan  follow MD protocol.  ISSUE YTB ex.      PT Home Exercise Plan  See handouts       Patient will benefit from skilled therapeutic intervention in order to improve the following deficits and impairments:  Pain, Decreased mobility, Decreased scar mobility, Hypomobility, Decreased strength, Decreased range of motion, Decreased endurance, Decreased activity tolerance, Impaired flexibility  Visit Diagnosis: Acute pain of right shoulder  Shoulder joint stiffness, right  Muscle weakness (generalized)  Chronic left shoulder pain  Shoulder joint stiffness, left  Acute pain of left shoulder     Problem List Patient Active Problem List   Diagnosis Date Noted  . Headache 12/18/2012  . Hypothermia 12/18/2012   Cammie McgeeMichael C Sherk, PT, DPT # [id number] Tomasa HoseJosh Jerime Arif, SPT 05/20/2018, 1:18 PM  [place] [facility] [address] [place], [place], [address] Phone: [phone]   Fax:  [phone]  Name: Sara Gomez MRN: [id number] Date of Birth: 07/23/1996

## 2018-05-22 ENCOUNTER — Ambulatory Visit: Payer: BLUE CROSS/BLUE SHIELD | Admitting: Physical Therapy

## 2018-05-22 ENCOUNTER — Encounter: Payer: Self-pay | Admitting: Physical Therapy

## 2018-05-22 DIAGNOSIS — M25612 Stiffness of left shoulder, not elsewhere classified: Secondary | ICD-10-CM

## 2018-05-22 DIAGNOSIS — M25511 Pain in right shoulder: Secondary | ICD-10-CM | POA: Diagnosis not present

## 2018-05-22 DIAGNOSIS — M6281 Muscle weakness (generalized): Secondary | ICD-10-CM

## 2018-05-22 DIAGNOSIS — G8929 Other chronic pain: Secondary | ICD-10-CM

## 2018-05-22 DIAGNOSIS — M25611 Stiffness of right shoulder, not elsewhere classified: Secondary | ICD-10-CM

## 2018-05-22 DIAGNOSIS — M25512 Pain in left shoulder: Secondary | ICD-10-CM

## 2018-05-22 NOTE — Therapy (Signed)
[place] [facility] [address] [place], [place], [address] Phone: [phone]   Fax:  [phone]  Physical Therapy Treatment  Patient Details  Name: Sara Gomez MRN: [id number] Date of Birth: 11/16/1995 Referring Provider: Dr. Roney Mansreighton   Encounter Date: 05/22/2018  PT End of Session - 05/22/18 1011    Visit Number  8    Number of Visits  16    Date for PT Re-Evaluation  06/11/18    PT Start Time  0734    PT Stop Time  0831    PT Time Calculation (min)  57 min    Activity Tolerance  Patient tolerated treatment well;Patient limited by pain    Behavior During Therapy  [facility] for tasks assessed/performed       History reviewed. No pertinent past medical history.  History reviewed. No pertinent surgical history.  There were no vitals filed for this visit.  Subjective Assessment - 05/22/18 0748    Subjective  Pt. reports that she felt much better after last session.  Pt. reports that she did not sleep well last night due to a headache, and her pain is increased due to lack of sleep.  (Pended)     Pertinent History  See previous PMHx.  Pt. known well to PT clinic.    (Pended)     Limitations  Lifting;Standing;Writing;House hold activities  (Pended)     Patient Stated Goals  Increase R and L shoulder ROM/ strength to improve pain-free mobility.    (Pended)     Currently in Pain?  Yes  (Pended)     Pain Score  4   (Pended)     Pain Location  Shoulder  (Pended)     Pain Orientation  Right  (Pended)     Pain Descriptors / Indicators  Aching  (Pended)     Pain Type  Surgical pain  (Pended)     Pain Onset  More than a month ago  (Pended)     Pain Frequency  Intermittent  (Pended)        Treatment   Ther Ex:  Nautilus:  10# Lat Pull Down x10 20# Tricep Ext. x10 10# Scapular Retraction x10 10# Chest Press x10 10# B sh. Adduction x10  YTB B IR/ER x10 Rhythmic Stabilization B with 2# (patient unable to perform without  pain) Rhythmic Stabilization B 3x30sec each   Manual:  Gentle stretching of B shoulder in all planes of movement Gentle B shoulder mobilizations in supine position (all planes of movement) Prone grade II-III mid-thoracic central/ unilateral mobs. 2x 20 sec. each. Pt. Reported decreased pain and increased ROM after last spinal mobilzation  Ice to R shoulder in seated posture after tx.  Pt educated on proper posture while seated with ice.      PT Long Term Goals - 04/23/18 0800      PT LONG TERM GOAL #1   Title  Pt. I with HEP to increase R shoulder AROM to [facility] as compared to L shoulder to improve pain-free mobility.      Baseline  Supine L shoulder AROM: flexion 146 deg., ER 36 deg. (joint stiffness/ pain), abd. 124 deg., IR 84 deg. No R shoulder active or passive ROM at this time (pt. can start PT tx. at 3 weeks post-surgery/ start 04/23/18).    Time  8    Period  Weeks    Status  New    Target Date  06/11/18  PT LONG TERM GOAL #2   Title  Pt. will increase R deltoid/ biceps/ triceps strength to grossly 4/5 MMT to improve return to household tasks/ running.     Baseline  L shoulder strength grossly 3+/5 MMT, biceps/triceps 4/5 MMT.  No R shoulder ROM/ strength testing at this time.      Time  8    Period  Weeks    Status  New    Target Date  06/11/18      PT LONG TERM GOAL #3   Title  Pt. will increase FOTO to 64 to improve R shoulder mobility.      Baseline  FOTO: baseling 42/ goal 64    Time  8    Period  Weeks    Status  New    Target Date  06/11/18      PT LONG TERM GOAL #4   Title  Pt. able to complete all work related tasks with no R shoulder limitations or pain to promote return to PLOF.      Baseline  Pt. not working at this time.      Time  8    Period  Weeks    Status  New    Target Date  06/11/18            Plan - 05/22/18 1012    Clinical Impression Statement  Pt. is progressing in strengthening portion of protocol.  Pt. able to perform  light strengthening exercises on nautilus without much increase in discomfort.  Pt. experienced tenderness with palpation over R biceps and deltoid region.  Pt. has multiple trigger points in R biceps and deltoid region that were treated with STM.  Pt. will continue strengthening of B UEs with HEP and skilled therapy sessions.    Clinical Presentation  Stable    Clinical Decision Making  Low    Rehab Potential  Excellent    PT Frequency  2x / week    PT Duration  8 weeks    PT Treatment/Interventions  ADLs/Self Care Home Management;Aquatic Therapy;Cryotherapy;Electrical Stimulation;Therapeutic exercise;Patient/family education;Neuromuscular re-education;Manual techniques;Scar mobilization;Passive range of motion    PT Next Visit Plan  Assess trigger points of R UE.  ISSUE THERABAND HEP NEXT TX.      PT Home Exercise Plan  See handouts       Patient will benefit from skilled therapeutic intervention in order to improve the following deficits and impairments:  Pain, Decreased mobility, Decreased scar mobility, Hypomobility, Decreased strength, Decreased range of motion, Decreased endurance, Decreased activity tolerance, Impaired flexibility  Visit Diagnosis: Acute pain of right shoulder  Shoulder joint stiffness, right  Muscle weakness (generalized)  Chronic left shoulder pain  Shoulder joint stiffness, left  Acute pain of left shoulder     Problem List Patient Active Problem List   Diagnosis Date Noted  . Headache 12/18/2012  . Hypothermia 12/18/2012   [name], PT, DPT # [phone] Josh Tishana Clinkenbeard,SPT 05/22/2018, 12:25 PM  [place] [facility] [address] [place], [place], [address] Phone: [phone]   Fax:  [phone]  Name: Sara Gomez MRN: [id number] Date of Birth: 1996-10-30

## 2018-05-23 ENCOUNTER — Encounter
Admission: RE | Admit: 2018-05-23 | Discharge: 2018-05-23 | Disposition: A | Discharge status: Home / Self Care | Payer: BLUE CROSS/BLUE SHIELD | Source: Ambulatory Visit | Attending: Gastroenterology | Admitting: Gastroenterology

## 2018-05-23 DIAGNOSIS — R112 Nausea with vomiting, unspecified: Secondary | ICD-10-CM | POA: Insufficient documentation

## 2018-05-23 DIAGNOSIS — R1084 Generalized abdominal pain: Secondary | ICD-10-CM | POA: Diagnosis not present

## 2018-05-23 DIAGNOSIS — R109 Unspecified abdominal pain: Secondary | ICD-10-CM

## 2018-05-23 HISTORY — DX: Nausea with vomiting, unspecified: R11.2

## 2018-05-23 HISTORY — DX: Unspecified abdominal pain: R10.9

## 2018-05-23 MED ORDER — TECHNETIUM TC 99M MEBROFENIN IV KIT
5.0000 | PACK | Freq: Once | INTRAVENOUS | Status: AC | PRN
  Administered 2018-05-23: 5.4 via INTRAVENOUS

## 2018-05-27 ENCOUNTER — Encounter: Payer: BLUE CROSS/BLUE SHIELD | Admitting: Physical Therapy

## 2018-05-29 ENCOUNTER — Ambulatory Visit: Payer: BLUE CROSS/BLUE SHIELD | Admitting: Physical Therapy

## 2018-05-29 DIAGNOSIS — M25612 Stiffness of left shoulder, not elsewhere classified: Secondary | ICD-10-CM

## 2018-05-29 DIAGNOSIS — M6281 Muscle weakness (generalized): Secondary | ICD-10-CM

## 2018-05-29 DIAGNOSIS — M25611 Stiffness of right shoulder, not elsewhere classified: Secondary | ICD-10-CM

## 2018-05-29 DIAGNOSIS — M25512 Pain in left shoulder: Secondary | ICD-10-CM

## 2018-05-29 DIAGNOSIS — G8929 Other chronic pain: Secondary | ICD-10-CM

## 2018-05-29 DIAGNOSIS — M25511 Pain in right shoulder: Secondary | ICD-10-CM | POA: Diagnosis not present

## 2018-05-31 ENCOUNTER — Encounter: Payer: Self-pay | Admitting: Physical Therapy

## 2018-05-31 NOTE — Therapy (Signed)
[place] [facility] [address] [place], [place], [address] Phone: [phone]   Fax:  [phone]  Physical Therapy Treatment  Patient Details  Name: Sara GullyChloe A Hincapie MRN: [id number] Date of Birth: 03/29/1996 Referring Provider: Dr. Roney Mansreighton   Encounter Date: 05/29/2018  Treatment 9 of 16.  Recert date:  2/53/667/30/19  Past Medical History:  Diagnosis Date  . Abdominal pain 05/23/2018  . Nausea and vomiting 05/23/2018   Per NM Hepato w Eject Fract order    History reviewed. No pertinent surgical history.  There were no vitals filed for this visit.      Pt. reports to have experienced migraines almost everyday for the past several days. Pt. reports a decrease in pain, however has not been compliant with HEP since last visit.h         Ther Ex:  SupineR andL shoulder stretches (all planes) - per MD protocol on R.  Prone B shoulder extension/ horizontal abduction 10x2 each (light PT assist with R shoulder).  Standing wall B shoulder abduction with feedback 10x Supine R/ sh. Rhythmic stabs. (min. To mod. Resistance)- all planes Modified prone plank for 30 sec. 5x (good technique/ no pain) Modified wall push push/ serratus punch Body blade with mirror feedback for posture Reviewed HEP in depth.    Manual:  Gentle stretching of B shoulder in all planes of movement Gentle B shoulder mobilizations in supine position (all planes of movement) Prone grade II-III mid-thoracic central/ unilateral mobs. 2x 20 sec. Each.    No Ice to R shoulder today but instructed pt. To ice at home        PT Long Term Goals - 04/23/18 0800      PT LONG TERM GOAL #1   Title  Pt. I with HEP to increase R shoulder AROM to [facility] as compared to L shoulder to improve pain-free mobility.      Baseline  Supine L shoulder AROM: flexion 146 deg., ER 36 deg. (joint stiffness/ pain), abd. 124 deg., IR 84 deg. No R shoulder active or passive ROM at this  time (pt. can start PT tx. at 3 weeks post-surgery/ start 04/23/18).    Time  8    Period  Weeks    Status  New    Target Date  06/11/18      PT LONG TERM GOAL #2   Title  Pt. will increase R deltoid/ biceps/ triceps strength to grossly 4/5 MMT to improve return to household tasks/ running.     Baseline  L shoulder strength grossly 3+/5 MMT, biceps/triceps 4/5 MMT.  No R shoulder ROM/ strength testing at this time.      Time  8    Period  Weeks    Status  New    Target Date  06/11/18      PT LONG TERM GOAL #3   Title  Pt. will increase FOTO to 64 to improve R shoulder mobility.      Baseline  FOTO: baseling 42/ goal 64    Time  8    Period  Weeks    Status  New    Target Date  06/11/18      PT LONG TERM GOAL #4   Title  Pt. able to complete all work related tasks with no R shoulder limitations or pain to promote return to PLOF.      Baseline  Pt. not working at this time.      Time  8  Period  Weeks    Status  New    Target Date  06/11/18         Plan - 05/31/18 0600    Clinical Impression Statement  PT continues to follow MD protocol for pt. (8 weeks s/p).  Pt. doing well with R sh. AROM in a pain tolerable range but remains limited with strengthening progression.  Good cervical AROM all planes with no c/o neck pain today.  Pt. particiapted well with PT tx. session today with fewer complaints and able to demonstrate good technique wtih prone plank/ scap. stability/ body blade.  PT will issue more progressive strengthening ex. program next visit.      Clinical Presentation  Stable    Clinical Decision Making  Low    Rehab Potential  Excellent    PT Frequency  2x / week    PT Duration  8 weeks    PT Treatment/Interventions  ADLs/Self Care Home Management;Aquatic Therapy;Cryotherapy;Electrical Stimulation;Therapeutic exercise;Patient/family education;Neuromuscular re-education;Manual techniques;Scar mobilization;Passive range of motion    PT Next Visit Plan  Assess trigger  points of R UE.  ISSUE THERABAND HEP NEXT TX.   ISSUE new schedule for 2x/week.       Patient will benefit from skilled therapeutic intervention in order to improve the following deficits and impairments:  Pain, Decreased mobility, Decreased scar mobility, Hypomobility, Decreased strength, Decreased range of motion, Decreased endurance, Decreased activity tolerance, Impaired flexibility  Visit Diagnosis: Acute pain of right shoulder  Shoulder joint stiffness, right  Muscle weakness (generalized)  Chronic left shoulder pain  Shoulder joint stiffness, left     Problem List Patient Active Problem List   Diagnosis Date Noted  . Headache 12/18/2012  . Hypothermia 12/18/2012   [name], PT, DPT # [phone] 05/31/2018, 6:06 AM  [place] [facility] [address] [place], [place], [address] Phone: [phone]   Fax:  [phone]  Name: Trany Chernick Cerrone MRN: [id number] Date of Birth: 1996-05-07

## 2018-06-03 ENCOUNTER — Ambulatory Visit: Payer: BLUE CROSS/BLUE SHIELD

## 2018-06-03 DIAGNOSIS — M6281 Muscle weakness (generalized): Secondary | ICD-10-CM

## 2018-06-03 DIAGNOSIS — M25511 Pain in right shoulder: Secondary | ICD-10-CM | POA: Diagnosis not present

## 2018-06-03 NOTE — Therapy (Signed)
[place] [facility] [address] [place], [place], [address] Phone: [phone]   Fax:  [phone]  Physical Therapy Treatment  Patient Details  Name: Sara GullyChloe A Gomez MRN: [id number] Date of Birth: 02/08/1996 Referring Provider: Dr. Roney Mansreighton   Encounter Date: 06/03/2018  PT End of Session - 06/03/18 0802    Visit Number  10    Number of Visits  16    Date for PT Re-Evaluation  06/11/18    PT Start Time  0805    PT Stop Time  0845    PT Time Calculation (min)  40 min    Activity Tolerance  Patient tolerated treatment well;Patient limited by pain    Behavior During Therapy  [facility] for tasks assessed/performed       Past Medical History:  Diagnosis Date  . Abdominal pain 05/23/2018  . Nausea and vomiting 05/23/2018   Per NM Hepato w Eject Fract order    History reviewed. No pertinent surgical history.  There were no vitals filed for this visit.  Subjective Assessment - 06/03/18 0801    Subjective  Pt states that she is doing well on this date. No specific quesions or concerns. She continues to get "pinching" with R shoulder abduction.     Pertinent History  See previous PMHx.  Pt. known well to PT clinic.      Limitations  Lifting;Standing;Writing;House hold activities    Patient Stated Goals  Increase R and L shoulder ROM/ strength to improve pain-free mobility.      Currently in Pain?  No/denies No pain at rest    Pain Onset  --        TREATMENT  Ther-ex  SupineR shoulder stretches (all planes) - per MD protocol on R.  Prone B shoulder extension/ horizontal abduction 2 x 10 each (light PT assist with R shoulder).  Standing wall B shoulder abduction, pt reports increase in pain. Instead performed standing R shoulder AROM abduction with scapular assist by therapist x 10 which abolishes pain and significantly increases ROM; Supine R/ sh. Rhythmic stabs. (min. To mod. Resistance)- all planes Modified prone plank for 30 sec. 5x  (good technique/ no pain)  Manual Therapy  Gentle R shoulder mobilizations insupine position, grade I-II, A/P in neutral R shoulder position and inferior at 90 shoulder abduction 30s/bout x 3 bouts each;es of movement)  No Ice to R shoulder today                       PT Education - 06/03/18 0801    Education provided  Yes    Education Details  Exercise form/technique    Person(s) Educated  Patient    Methods  Explanation    Comprehension  Verbalized understanding          PT Long Term Goals - 04/23/18 0800      PT LONG TERM GOAL #1   Title  Pt. I with HEP to increase R shoulder AROM to [facility] as compared to L shoulder to improve pain-free mobility.      Baseline  Supine L shoulder AROM: flexion 146 deg., ER 36 deg. (joint stiffness/ pain), abd. 124 deg., IR 84 deg. No R shoulder active or passive ROM at this time (pt. can start PT tx. at 3 weeks post-surgery/ start 04/23/18).    Time  8    Period  Weeks    Status  New    Target Date  06/11/18  PT LONG TERM GOAL #2   Title  Pt. will increase R deltoid/ biceps/ triceps strength to grossly 4/5 MMT to improve return to household tasks/ running.     Baseline  L shoulder strength grossly 3+/5 MMT, biceps/triceps 4/5 MMT.  No R shoulder ROM/ strength testing at this time.      Time  8    Period  Weeks    Status  New    Target Date  06/11/18      PT LONG TERM GOAL #3   Title  Pt. will increase FOTO to 64 to improve R shoulder mobility.      Baseline  FOTO: baseling 42/ goal 64    Time  8    Period  Weeks    Status  New    Target Date  06/11/18      PT LONG TERM GOAL #4   Title  Pt. able to complete all work related tasks with no R shoulder limitations or pain to promote return to PLOF.      Baseline  Pt. not working at this time.      Time  8    Period  Weeks    Status  New    Target Date  06/11/18            Plan - 06/03/18 0802    Clinical Impression Statement  Pt with notable pain  during R shoulder AROM abduction which is abolished with scapular assist. Discussed concurrence of SAIS following shoulder surgery and need for periscapular strengthening. Pt encouraged to continue HEP and follow-up as scheduled.     Rehab Potential  Excellent    PT Frequency  2x / week    PT Duration  8 weeks    PT Treatment/Interventions  ADLs/Self Care Home Management;Aquatic Therapy;Cryotherapy;Electrical Stimulation;Therapeutic exercise;Patient/family education;Neuromuscular re-education;Manual techniques;Scar mobilization;Passive range of motion    PT Next Visit Plan  Assess trigger points of R UE.  ISSUE THERABAND HEP NEXT TX.   ISSUE new schedule for 2x/week.       Patient will benefit from skilled therapeutic intervention in order to improve the following deficits and impairments:  Pain, Decreased mobility, Decreased scar mobility, Hypomobility, Decreased strength, Decreased range of motion, Decreased endurance, Decreased activity tolerance, Impaired flexibility  Visit Diagnosis: Acute pain of right shoulder  Muscle weakness (generalized)     Problem List Patient Active Problem List   Diagnosis Date Noted  . Headache 12/18/2012  . Hypothermia 12/18/2012   Lynnea Maizes PT, DPT, GCS  Cynethia Schindler 06/03/2018, 10:32 AM  [facility] [address] [place], [place], [address] Phone: [phone]   Fax:  [phone]  Name: Sara Gomez MRN: [id number] Date of Birth: 03-Jun-1996

## 2018-06-05 ENCOUNTER — Ambulatory Visit: Payer: BLUE CROSS/BLUE SHIELD

## 2018-06-05 ENCOUNTER — Encounter: Payer: Self-pay | Admitting: Physical Therapy

## 2018-06-05 DIAGNOSIS — G8929 Other chronic pain: Secondary | ICD-10-CM

## 2018-06-05 DIAGNOSIS — M25511 Pain in right shoulder: Secondary | ICD-10-CM

## 2018-06-05 DIAGNOSIS — M6281 Muscle weakness (generalized): Secondary | ICD-10-CM

## 2018-06-05 DIAGNOSIS — M25612 Stiffness of left shoulder, not elsewhere classified: Secondary | ICD-10-CM

## 2018-06-05 DIAGNOSIS — M25512 Pain in left shoulder: Secondary | ICD-10-CM

## 2018-06-05 DIAGNOSIS — M25611 Stiffness of right shoulder, not elsewhere classified: Secondary | ICD-10-CM

## 2018-06-05 NOTE — Therapy (Cosign Needed)
[place] [facility] [address] [place], [place], [address] Phone: [phone]   Fax:  [phone]  Physical Therapy Treatment  Patient Details  Name: Sara Gomez MRN: [id number] Date of Birth: 07/11/1996 Referring Provider: Dr. Roney Mansreighton   Encounter Date: 06/05/2018  PT End of Session - 06/05/18 0900    Visit Number  11    Number of Visits  16    Date for PT Re-Evaluation  06/11/18    PT Start Time  0800    PT Stop Time  0846    PT Time Calculation (min)  46 min    Activity Tolerance  Patient tolerated treatment well;Patient limited by pain    Behavior During Therapy  [facility] for tasks assessed/performed       Past Medical History:  Diagnosis Date  . Abdominal pain 05/23/2018  . Nausea and vomiting 05/23/2018   Per [place] Hepato w Eject Fract order    History reviewed. No pertinent surgical history.  There were no vitals filed for this visit.  Subjective Assessment - 06/05/18 0858    Subjective  Pt. reports she has been more consistent with HEP and has ben trying to do more exercises in order to be ready for the tryouts for the [employer].  Pt. reports that she felt better after last session.    Pertinent History  See previous PMHx.  Pt. known well to PT clinic.      Limitations  Lifting;Standing;Writing;House hold activities    Patient Stated Goals  Increase R and L shoulder ROM/ strength to improve pain-free mobility.      Currently in Pain?  No/denies    Pain Onset  More than a month ago           TREATMENT  Ther-ex  SupineR shoulder stretches (all planes), per MD protocol on R.  Prone B shoulder extension/ horizontal abduction 2 x 10 each (light PT assist with R shoulder). Standing wall B shoulder abduction, pt reports increase in pain. Instead performed standing R shoulder AROM abduction with scapular assist by therapist 2 x 10 which abolishes pain and significantly increases ROM; Supine R/ sh.  Rhythmic stabs. (min. To mod. Resistance)- all planes   Manual Therapy  Gentle R shoulder mobilizations insupine position, grade I-II, A/P in neutral R shoulder position and inferior at 90 shoulder abduction 30s/bout x 3 bouts each of movement)  NoIce to R shouldertoday      PT Education - 06/05/18 1659    Education provided  Yes    Education Details  exercise form/technique    Person(s) Educated  Patient    Methods  Explanation    Comprehension  Verbalized understanding          PT Long Term Goals - 04/23/18 0800      PT LONG TERM GOAL #1   Title  Pt. I with HEP to increase R shoulder AROM to [facility] as compared to L shoulder to improve pain-free mobility.      Baseline  Supine L shoulder AROM: flexion 146 deg., ER 36 deg. (joint stiffness/ pain), abd. 124 deg., IR 84 deg. No R shoulder active or passive ROM at this time (pt. can start PT tx. at 3 weeks post-surgery/ start 04/23/18).    Time  8    Period  Weeks    Status  New    Target Date  06/11/18      PT LONG TERM GOAL #2   Title  Pt. will increase R deltoid/ biceps/ triceps strength to grossly 4/5 MMT to improve return to household tasks/ running.     Baseline  L shoulder strength grossly 3+/5 MMT, biceps/triceps 4/5 MMT.  No R shoulder ROM/ strength testing at this time.      Time  8    Period  Weeks    Status  New    Target Date  06/11/18      PT LONG TERM GOAL #3   Title  Pt. will increase FOTO to 64 to improve R shoulder mobility.      Baseline  FOTO: baseling 42/ goal 64    Time  8    Period  Weeks    Status  New    Target Date  06/11/18      PT LONG TERM GOAL #4   Title  Pt. able to complete all work related tasks with no R shoulder limitations or pain to promote return to PLOF.      Baseline  Pt. not working at this time.      Time  8    Period  Weeks    Status  New    Target Date  06/11/18            Plan - 06/05/18 0901    Clinical Impression Statement  Pt. performed well with AROM  abduction with scapular assist.  Pt. has increased ROM and seems to be performing much better with exercises due to increased compliance with HEP.    Clinical Presentation  Stable    Clinical Decision Making  Low    Rehab Potential  Excellent    PT Frequency  2x / week    PT Duration  8 weeks    PT Treatment/Interventions  ADLs/Self Care Home Management;Aquatic Therapy;Cryotherapy;Electrical Stimulation;Therapeutic exercise;Patient/family education;Neuromuscular re-education;Manual techniques;Scar mobilization;Passive range of motion    PT Next Visit Plan  Theraband exercises.    PT Home Exercise Plan  See handouts       Patient will benefit from skilled therapeutic intervention in order to improve the following deficits and impairments:  Pain, Decreased mobility, Decreased scar mobility, Hypomobility, Decreased strength, Decreased range of motion, Decreased endurance, Decreased activity tolerance, Impaired flexibility  Visit Diagnosis: Acute pain of right shoulder  Muscle weakness (generalized)  Shoulder joint stiffness, right  Chronic left shoulder pain  Shoulder joint stiffness, left  Acute pain of left shoulder     Problem List Patient Active Problem List   Diagnosis Date Noted  . Headache 12/18/2012  . Hypothermia 12/18/2012   This entire session was performed under direct supervision and direction of a licensed therapist/therapist assistant . I have personally read, edited and approve of the note as written.   Nolon Bussing SPT Lynnea Maizes PT, DPT, GCS  Huprich,Jason 06/06/2018, 8:17 AM  [facility] [address] [place], [place], [address] Phone: [phone]   Fax:  [phone]  Name: Sara Gomez MRN: [id number] Date of Birth: 1996/11/02

## 2018-06-07 HISTORY — PX: UPPER GI ENDOSCOPY: SHX6162

## 2018-06-07 HISTORY — PX: COLONOSCOPY: SHX174

## 2018-06-10 ENCOUNTER — Ambulatory Visit: Payer: BLUE CROSS/BLUE SHIELD

## 2018-06-10 ENCOUNTER — Ambulatory Visit: Payer: BLUE CROSS/BLUE SHIELD | Admitting: Physical Therapy

## 2018-06-10 DIAGNOSIS — M6281 Muscle weakness (generalized): Secondary | ICD-10-CM

## 2018-06-10 DIAGNOSIS — M25511 Pain in right shoulder: Secondary | ICD-10-CM

## 2018-06-10 DIAGNOSIS — M25612 Stiffness of left shoulder, not elsewhere classified: Secondary | ICD-10-CM

## 2018-06-10 DIAGNOSIS — M25512 Pain in left shoulder: Secondary | ICD-10-CM

## 2018-06-10 DIAGNOSIS — M25611 Stiffness of right shoulder, not elsewhere classified: Secondary | ICD-10-CM

## 2018-06-10 DIAGNOSIS — G8929 Other chronic pain: Secondary | ICD-10-CM

## 2018-06-10 NOTE — Therapy (Cosign Needed)
[facility] [facility] [address] [place], [place], [address] Phone: [phone]   Fax:  [phone]  Physical Therapy Treatment  Patient Details  Name: Sara Gomez MRN: [id number] Date of Birth: 11/07/1996 Referring Provider: Dr. Roney Mans   Encounter Date: 06/10/2018  PT End of Session - 06/10/18 1713    Visit Number  12    Number of Visits  16    Date for PT Re-Evaluation  06/11/18    PT Start Time  1302    PT Stop Time  1349    PT Time Calculation (min)  47 min    Activity Tolerance  Patient tolerated treatment well;Patient limited by pain    Behavior During Therapy  [facility] for tasks assessed/performed       Past Medical History:  Diagnosis Date  . Abdominal pain 05/23/2018  . Nausea and vomiting 05/23/2018   Per [place] Hepato w Eject Fract order    History reviewed. No pertinent surgical history.  There were no vitals filed for this visit.  Subjective Assessment - 06/10/18 1712    Subjective  Pt. reports soreness in B shoulders upon entering the clinic today.  Pt. reports that she was able to drive over the weekend and worked, however felt like she may have overdone things.    Pertinent History  See previous PMHx.  Pt. known well to PT clinic.      Limitations  Lifting;Standing;Writing;House hold activities    Patient Stated Goals  Increase R and L shoulder ROM/ strength to improve pain-free mobility.      Currently in Pain?  Yes    Pain Score  4     Pain Location  Shoulder    Pain Orientation  Left;Right    Pain Descriptors / Indicators  Aching;Sore    Pain Onset  More than a month ago        Manual Therapy GentleRshoulder mobilizations insupine position, grade I-II, A/P in neutral R shoulder position and inferior at 90 shoulder abduction 30s/bout x 3 bouts each of movement) Seated Pulleys with B UE (flex/ abduction/ scaption) x20 each STM to UT (5 min) STM to B Deltoid (5 min)  E-stim IFC to R shoulder for pain  control at pt tolerated intensity x 15 minutes with moist heat pack per pt preference. No superficial skin irritation noted following removal of electrodes.              PT Long Term Goals - 04/23/18 0800      PT LONG TERM GOAL #1   Title  Pt. I with HEP to increase R shoulder AROM to [facility] as compared to L shoulder to improve pain-free mobility.      Baseline  Supine L shoulder AROM: flexion 146 deg., ER 36 deg. (joint stiffness/ pain), abd. 124 deg., IR 84 deg. No R shoulder active or passive ROM at this time (pt. can start PT tx. at 3 weeks post-surgery/ start 04/23/18).    Time  8    Period  Weeks    Status  New    Target Date  06/11/18      PT LONG TERM GOAL #2   Title  Pt. will increase R deltoid/ biceps/ triceps strength to grossly 4/5 MMT to improve return to household tasks/ running.     Baseline  L shoulder strength grossly 3+/5 MMT, biceps/triceps 4/5 MMT.  No R shoulder ROM/ strength testing at this time.      Time  8    Period  Weeks    Status  New    Target Date  06/11/18      PT LONG TERM GOAL #3   Title  Pt. will increase FOTO to 64 to improve R shoulder mobility.      Baseline  FOTO: baseling 42/ goal 64    Time  8    Period  Weeks    Status  New    Target Date  06/11/18      PT LONG TERM GOAL #4   Title  Pt. able to complete all work related tasks with no R shoulder limitations or pain to promote return to PLOF.      Baseline  Pt. not working at this time.      Time  8    Period  Weeks    Status  New    Target Date  06/11/18            Plan - 06/10/18 1714    Clinical Impression Statement  Pt. is able to achieve all shoulder ROM measurements based on MD protocol.  Pt. was limited with exercises due to increase pain from weekend.  Pt. will benefit from skilled therapy to increase strength and ROM of B shoulders to return to full functional mobility.  Pt. 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     Clinical Presentation  Stable    Clinical Decision Making  Low    Rehab Potential  Excellent    PT Frequency  2x / week    PT Duration  8 weeks    PT Treatment/Interventions  ADLs/Self Care Home Management;Aquatic Therapy;Cryotherapy;Electrical Stimulation;Therapeutic exercise;Patient/family education;Neuromuscular re-education;Manual techniques;Scar mobilization;Passive range of motion    PT Next Visit Plan  repeat goals and recert. Discuss transfer of care to /[place] therapist, initiate isometric strengthening    PT Home Exercise Plan  See handouts       Patient will benefit from skilled therapeutic intervention in order to improve the following deficits and impairments:  Pain, Decreased mobility, Decreased scar mobility, Hypomobility, Decreased strength, Decreased range of motion, Decreased endurance, Decreased activity tolerance, Impaired flexibility  Visit Diagnosis: Acute pain of right shoulder  Muscle weakness (generalized)  Shoulder joint stiffness, right  Chronic left shoulder pain  Shoulder joint stiffness, left  Acute pain of left shoulder     Problem List Patient Active Problem List   Diagnosis Date Noted  . Headache 12/18/2012  . Hypothermia 12/18/2012   This entire session was performed under direct supervision and direction of a licensed therapist/therapist assistant . I have personally read, edited and approve of the note as written.   Nolon BussingJoshua Aleksei Goodlin SPT Lynnea MaizesJason D Huprich PT, DPT, GCS  Huprich,Jason 06/11/2018, 11:55 AM  [place] [facility] [address] [place], [place], [address] Phone: [phone]   Fax:  [phone]  Name: Ihor GullyChloe A Dearing MRN: [id number] Date of Birth: 08/18/1996

## 2018-06-12 ENCOUNTER — Encounter: Payer: BLUE CROSS/BLUE SHIELD | Admitting: Physical Therapy

## 2018-06-13 ENCOUNTER — Encounter: Payer: Self-pay | Admitting: Physical Therapy

## 2018-06-13 ENCOUNTER — Ambulatory Visit: Payer: BLUE CROSS/BLUE SHIELD | Attending: Orthopedic Surgery | Admitting: Physical Therapy

## 2018-06-13 DIAGNOSIS — M25511 Pain in right shoulder: Secondary | ICD-10-CM | POA: Diagnosis not present

## 2018-06-13 DIAGNOSIS — G8929 Other chronic pain: Secondary | ICD-10-CM

## 2018-06-13 DIAGNOSIS — M25611 Stiffness of right shoulder, not elsewhere classified: Secondary | ICD-10-CM

## 2018-06-13 DIAGNOSIS — M25612 Stiffness of left shoulder, not elsewhere classified: Secondary | ICD-10-CM | POA: Insufficient documentation

## 2018-06-13 DIAGNOSIS — M6281 Muscle weakness (generalized): Secondary | ICD-10-CM | POA: Insufficient documentation

## 2018-06-13 DIAGNOSIS — M25512 Pain in left shoulder: Secondary | ICD-10-CM | POA: Insufficient documentation

## 2018-06-13 NOTE — Therapy (Signed)
[place] [facility] [address] [place], [place], [address] Phone: [phone]   Fax:  [phone]  Physical Therapy Treatment  Patient Details  Name: Sara Gomez MRN: [id number] Date of Birth: 1996/03/26 Referring Provider: Dr. Jeannie Fend   Encounter Date: 06/13/2018  PT End of Session - 06/13/18 1519    Visit Number  13    Number of Visits  21    Date for PT Re-Evaluation  07/11/18    PT Start Time  0803    PT Stop Time  0902    PT Time Calculation (min)  59 min    Activity Tolerance  Patient tolerated treatment well;Patient limited by pain    Behavior During Therapy  [facility] for tasks assessed/performed       Past Medical History:  Diagnosis Date  . Abdominal pain 05/23/2018  . Nausea and vomiting 05/23/2018   Per [place] Hepato w Eject Fract order    History reviewed. No pertinent surgical history.  There were no vitals filed for this visit.  Subjective Assessment - 06/13/18 1334    Subjective  Pt. entered PT with no new complaints.  Pt. reports being tired and not sleeping well.  Pt. dog sitting this weekend and didn't state she was compliant with strengthening ex.      Pertinent History  See previous PMHx.  Pt. known well to PT clinic.      Limitations  Lifting;Standing;Writing;House hold activities    Patient Stated Goals  Increase R and L shoulder ROM/ strength to improve pain-free mobility.      Currently in Pain?  Yes    Pain Score  3     Pain Location  Shoulder    Pain Orientation  Right    Pain Descriptors / Indicators  Aching;Sore    Pain Type  Surgical pain         [facility] PT Assessment - 06/13/18 0001      Assessment   Medical Diagnosis  s/p R Latarjet procedure/ L shoulder pain    Referring Provider  Dr. Jeannie Fend    Onset Date/Surgical Date  04/02/18    Hand Dominance  Right    Prior Therapy  yes      Prior Function   Level of Independence  Independent         TREATMENT  Ther-ex Prone B shoulder  extension/ horizontal abduction2 x 10each (light PT assist with R shoulder). L sidelying/ standing R sh. AROM abduction with scapular assist by therapist 2 x 10 (no pain) Supine R/ sh. Rhythmic stabs. (min. To mod. Resistance)- all planes Supine 2# tricep ext./ bicep curls 20x. Supine R sh. Manual isometrics all planes as tolerated 10x. (pt. Has limited effort)- tired/ pain focused.  Standing bodyblade (multiple planes)- L/R  Manual Therapy SupineR shoulderAAROM/ stretches (all planes), per MD protocol on R.  GentleRshoulder mobilizations insupine position, grade I-II, A/P in neutral R shoulder position and inferior at 90 shoulder abduction 30s/bout x 3 bouts each of movement)  NoIce to R shouldertoday      PT Long Term Goals - 06/13/18 1733      PT LONG TERM GOAL #1   Title  Pt. I with HEP to increase R shoulder AROM to [facility] as compared to L shoulder to improve pain-free mobility.      Baseline  B shoulder AROM WFL.  Pain limited with end-range R sh. flexion/ abd.      Time  4    Period  Weeks    Status  Partially Met    Target Date  07/11/18      PT LONG TERM GOAL #2   Title  Pt. will increase R deltoid/ biceps/ triceps strength to grossly 4/5 MMT to improve return to household tasks/ running.     Baseline  L shoulder strength grossly 3+/5 MMT, biceps/triceps 4/5 MMT.  No R shoulder strength limited (poor effort today).      Time  4    Period  Weeks    Status  Not Met    Target Date  07/11/18      PT LONG TERM GOAL #3   Title  Pt. will increase FOTO to 64 to improve R shoulder mobility.      Baseline  FOTO: baseling 42/ goal 64    Time  4    Period  Weeks    Status  On-going    Target Date  07/11/18      PT LONG TERM GOAL #4   Title  Pt. able to complete all work related tasks with no R shoulder limitations or pain to promote return to PLOF.      Baseline  Pt. completes work related tasks with minimal limitations/ slight increase in pain.     Time  4     Period  Weeks    Status  Not Met    Target Date  07/11/18            Plan - 06/13/18 1520    Clinical Impression Statement  Pt. has progressed well towards MD protocol with R shoulder AROM.  Pt. remains pain limited with R standing R shoulder flexion/ abduction.  Limited progress/ carry over with R shoulder stability ex.   Poor compliance with HEP between tx. sessions and PT really emphasizes the importance of doing her HEP/ strengthening ex.   Pt. has recently returned to driving with no limitations.  See updated goals.      Clinical Presentation  Stable    Clinical Decision Making  Low    Rehab Potential  Excellent    PT Frequency  2x / week    PT Duration  8 weeks    PT Treatment/Interventions  ADLs/Self Care Home Management;Aquatic Therapy;Cryotherapy;Electrical Stimulation;Therapeutic exercise;Patient/family education;Neuromuscular re-education;Manual techniques;Scar mobilization;Passive range of motion    PT Next Visit Plan  Progress strength training.      PT Home Exercise Plan  See handouts       Patient will benefit from skilled therapeutic intervention in order to improve the following deficits and impairments:  Pain, Decreased mobility, Decreased scar mobility, Hypomobility, Decreased strength, Decreased range of motion, Decreased endurance, Decreased activity tolerance, Impaired flexibility  Visit Diagnosis: Acute pain of right shoulder  Muscle weakness (generalized)  Shoulder joint stiffness, right  Chronic left shoulder pain     Problem List Patient Active Problem List   Diagnosis Date Noted  . Headache 12/18/2012  . Hypothermia 12/18/2012   Pura Spice, PT, DPT # [id number] 06/13/2018, 5:36 PM  [facility] [address] [place], [place], [address] Phone: [phone]   Fax:  [phone]  Name: Aneita Kiger Durall MRN: [id number] Date of Birth: 12-26-1995

## 2018-06-17 ENCOUNTER — Ambulatory Visit: Payer: BLUE CROSS/BLUE SHIELD | Admitting: Physical Therapy

## 2018-06-17 ENCOUNTER — Encounter: Payer: Self-pay | Admitting: Physical Therapy

## 2018-06-17 ENCOUNTER — Encounter: Payer: BLUE CROSS/BLUE SHIELD | Admitting: Physical Therapy

## 2018-06-17 DIAGNOSIS — M25511 Pain in right shoulder: Secondary | ICD-10-CM

## 2018-06-17 DIAGNOSIS — M6281 Muscle weakness (generalized): Secondary | ICD-10-CM

## 2018-06-17 DIAGNOSIS — M25611 Stiffness of right shoulder, not elsewhere classified: Secondary | ICD-10-CM

## 2018-06-17 DIAGNOSIS — M25612 Stiffness of left shoulder, not elsewhere classified: Secondary | ICD-10-CM

## 2018-06-17 DIAGNOSIS — G8929 Other chronic pain: Secondary | ICD-10-CM

## 2018-06-17 DIAGNOSIS — M25512 Pain in left shoulder: Secondary | ICD-10-CM

## 2018-06-17 NOTE — Therapy (Signed)
[facility] [address] [place], [place], [address] Phone: [phone]   Fax:  [phone]  Physical Therapy Treatment  Patient Details  Name: Sara Gomez MRN: [id number] Date of Birth: 11/26/1995 Referring Provider: Dr. Creighton   Encounter Date: 06/17/2018  PT End of Session - 06/17/18 0753    Visit Number  13    Number of Visits  21    Date for PT Re-Evaluation  07/11/18    PT Start Time  0734    PT Stop Time  0833    PT Time Calculation (min)  59 min    Activity Tolerance  Patient tolerated treatment well;Patient limited by pain    Behavior During Therapy  [facility] for tasks assessed/performed       Past Medical History:  Diagnosis Date  . Abdominal pain 05/23/2018  . Nausea and vomiting 05/23/2018   Per [facility] Hepato w Eject Fract order    History reviewed. No pertinent surgical history.  There were no vitals filed for this visit.  Subjective Assessment - 06/17/18 0740    Subjective  Pt. reports no new complaints upon arrival to clinic this AM.  Pt. reports not being compliant with HEP.    Pertinent History  See previous PMHx.  Pt. known well to PT clinic.      Limitations  Lifting;Standing;Writing;House hold activities    Patient Stated Goals  Increase R and L shoulder ROM/ strength to improve pain-free mobility.      Currently in Pain?  No/denies    Pain Score  0-No pain         TREATMENT  Ther-ex Supine R/ sh. Rhythmic stabs. (min. To mod. Resistance)- all planes Standing Nautilus:   30# Tricep Ext. x10  30# Scapular Retraction x10  10# B sh. Adduction x10 Supine R sh. Manual isometrics all planes as tolerated 10x. (pt. has limited effort) Standing bodyblade (multiple planes)- L/R  Manual Therapy SupineR shoulder AAROM/ stretches (all planes),per MD protocol on R.  Prone Grade II-III Unilateral PA's to patient comfort   NoIce to R shouldertoday      PT Long Term Goals - 06/13/18 1733       PT LONG TERM GOAL #1   Title  Pt. I with HEP to increase R shoulder AROM to WFL as compared to L shoulder to improve pain-free mobility.      Baseline  B shoulder AROM WFL.  Pain limited with end-range R sh. flexion/ abd.      Time  4    Period  Weeks    Status  Partially Met    Target Date  07/11/18      PT LONG TERM GOAL #2   Title  Pt. will increase R deltoid/ biceps/ triceps strength to grossly 4/5 MMT to improve return to household tasks/ running.     Baseline  L shoulder strength grossly 3+/5 MMT, biceps/triceps 4/5 MMT.  No R shoulder strength limited (poor effort today).      Time  4    Period  Weeks    Status  Not Met    Target Date  07/11/18      PT LONG TERM GOAL #3   Title  Pt. will increase FOTO to 64 to improve R shoulder mobility.      Baseline  FOTO: baseling 42/ goal 64    Time  4    Period  Weeks    Status  On-going    Target   Date  07/11/18      PT LONG TERM GOAL #4   Title  Pt. able to complete all work related tasks with no R shoulder limitations or pain to promote return to PLOF.      Baseline  Pt. completes work related tasks with minimal limitations/ slight increase in pain.     Time  4    Period  Weeks    Status  Not Met    Target Date  07/11/18            Plan - 06/17/18 0754    Clinical Impression Statement  Pt. requires maximal cueing and motivation in order to perform simple tasks.  Pt. able to perform resistance exercises on Nautilus machine with convincing.  Pt. will be given extensive HEP to perofrm while at school and will be referred to another PT clinic located near school.    Clinical Presentation  Stable    Clinical Decision Making  Low    Rehab Potential  Excellent    PT Frequency  2x / week    PT Duration  4 weeks    PT Treatment/Interventions  ADLs/Self Care Home Management;Aquatic Therapy;Cryotherapy;Electrical Stimulation;Therapeutic exercise;Patient/family education;Neuromuscular re-education;Manual techniques;Scar  mobilization;Passive range of motion    PT Next Visit Plan  Progress strength training.      PT Home Exercise Plan  See handouts       Patient will benefit from skilled therapeutic intervention in order to improve the following deficits and impairments:  Pain, Decreased mobility, Decreased scar mobility, Hypomobility, Decreased strength, Decreased range of motion, Decreased endurance, Decreased activity tolerance, Impaired flexibility  Visit Diagnosis: Acute pain of right shoulder  Muscle weakness (generalized)  Shoulder joint stiffness, right  Chronic left shoulder pain  Shoulder joint stiffness, left  Acute pain of left shoulder     Problem List Patient Active Problem List   Diagnosis Date Noted  . Headache 12/18/2012  . Hypothermia 12/18/2012   Pura Spice, PT, DPT # [id number] [place], SPT 06/17/2018, 12:57 PM  [place] [facility] [address] [place], [place], [address] Phone: [phone]   Fax:  [phone]  Name: Sara Gomez MRN: [id number] Date of Birth: [birth year]

## 2018-06-18 ENCOUNTER — Ambulatory Visit (INDEPENDENT_AMBULATORY_CARE_PROVIDER_SITE_OTHER): Payer: BLUE CROSS/BLUE SHIELD | Admitting: General Surgery

## 2018-06-18 ENCOUNTER — Encounter: Payer: Self-pay | Admitting: General Surgery

## 2018-06-18 VITALS — BP 118/66 | HR 66 | Temp 97.4°F | Ht 67.5 in | Wt 136.0 lb

## 2018-06-18 DIAGNOSIS — R1084 Generalized abdominal pain: Secondary | ICD-10-CM | POA: Diagnosis not present

## 2018-06-18 DIAGNOSIS — R634 Abnormal weight loss: Secondary | ICD-10-CM | POA: Diagnosis not present

## 2018-06-18 DIAGNOSIS — R112 Nausea with vomiting, unspecified: Secondary | ICD-10-CM | POA: Diagnosis not present

## 2018-06-18 NOTE — Progress Notes (Signed)
Patient ID: Sara Gomez, female   DOB: Mar 21, 1996, 22 y.o.   MRN: [id number]  Chief Complaint  Patient presents with  . Abdominal Pain    HPI Sara Gomez is a 22 y.o. female.  Here for evaluation of abdominal pain, nausea vomiting and bloating referred by Laurine Blazer PA. The sharpe pain is centrally located below the navel and daily. It starts 5 minutes to30 minutes after meals. Started last year and she was seen by GI. HIDA was 05-23-18 and states she had pain with the procedure. Abdominal ultrasound was 04-24-18. She admits to a 15 pound weight loss since February. She even tried altering her diet, gluten free and avoiding meats. She drinks smoothies and protein drinks. She states she vomits "most everything she eats" and her lower abdominal bloating. She admits to feeling better after she vomits. She does admit to diarrhea and constipation, uses Miralax as needed. Bowels move daily. She is a Ship broker at [employer].  Scheduled to go back to school to begin second semester sophomore year majoring in biology looking to study reproductive health in the next 2 weeks. She is here with her mom, Sara Gomez.  HPI  Past Medical History:  Diagnosis Date  . Abdominal pain 05/23/2018  . Nausea and vomiting 05/23/2018   Per [facility] Hepato w Eject Fract order    Past Surgical History:  Procedure Laterality Date  . COLONOSCOPY  06/07/2018   Dr Alice Reichert  . SHOULDER ARTHROSCOPY     x2  . SHOULDER LATERJET     x3  . TONSILLECTOMY  2005  . UPPER GI ENDOSCOPY  06/07/2018   Dr Alice Reichert  . WISDOM TOOTH EXTRACTION      Family History  Problem Relation Age of Onset  . Kidney disease Sister        disorder  . Colon cancer Neg Hx   . Breast cancer Neg Hx     Social History Social History   Tobacco Use  . Smoking status: Never Smoker  . Smokeless tobacco: Never Used  Substance Use Topics  . Alcohol use: No    Frequency: Never  . Drug use: No    Allergies  Allergen  Reactions  . Oxycodone Nausea And Vomiting  . Cat Hair Extract Other (See Comments)    Other reaction(s): Other (See Comments) Other reaction(s): Other (See Comments)     Current Outpatient Medications  Medication Sig Dispense Refill  . Adapalene-Benzoyl Peroxide 0.1-2.5 % gel APPLY TO AFFECTED AREA AT BEDTIME    . albuterol (PROVENTIL HFA;VENTOLIN HFA) 108 (90 Base) MCG/ACT inhaler INHALE 2 PUFFS EVERY 4 HOURS AS NEEDED FOR COUGH OR WHEEZE  0  . citalopram (CELEXA) 10 MG tablet Take 10 mg by mouth daily.  1  . clindamycin-benzoyl peroxide (BENZACLIN) gel APPLY TO THE AFFECTED AREA EVERY DAY BEFORE NOON    . FLOVENT HFA 44 MCG/ACT inhaler TAKE 2 INHALATIONS BY MOUTH TWICE DAILY  3  . ibuprofen (ADVIL,MOTRIN) 200 MG tablet Take 400 mg by mouth every 6 (six) hours as needed.     . naproxen sodium (ANAPROX) 550 MG tablet TAKE 1 TAB BY MOUTH EVERY 12 HOURS prn    . Norgestimate-Ethinyl Estradiol Triphasic (TRI-SPRINTEC) 0.18/0.215/0.25 MG-35 MCG tablet Take 1 tablet by mouth daily.    . ondansetron (ZOFRAN-ODT) 8 MG disintegrating tablet TAKE 1 TAB BY MOUTH EVERY 8 HOURS AS NEEDED FOR NAUSEA AND VOMITING  0  . polyethylene glycol (MIRALAX / GLYCOLAX) packet Take by mouth as  needed.     . promethazine (PHENERGAN) 12.5 MG tablet TAKE 1 TABLET BY MOUTH EVERY 6 HOURS AS NEEDED FOR NAUSEA FOR UP TO 7 DAYS  0  . Spacer/Aero-Holding Chambers (OPTICHAMBER DIAMOND) MISC USE AS DIRECTED WITH ALBUTEROL INHALER  0  . SUMAtriptan (IMITREX) 100 MG tablet TAKE 1/2 TAB BY MOUTH AT ONSET OF HEADACHE MAX. DAILY DOSE: 1 TAB  1  . silver sulfADIAZINE (SILVADENE) 1 % cream APPLY TO THE AFFECTED AREAS TWICE DAILY FOR 7 DAYS  0   No current facility-administered medications for this visit.     Review of Systems Review of Systems  Constitutional: Positive for unexpected weight change.  Respiratory: Negative.   Cardiovascular: Negative.   Gastrointestinal: Positive for abdominal pain, constipation, diarrhea,  nausea and vomiting.    Blood pressure 118/66, pulse 66, temperature (!) 97.4 F (36.3 C), temperature source Oral, height 5' 7.5" (1.715 m), weight 136 lb (61.7 kg), last menstrual period 06/12/2018.  Physical Exam Physical Exam  Constitutional: She is oriented to person, place, and time. She appears well-developed and well-nourished.  HENT:  Mouth/Throat: Oropharynx is clear and moist. No oropharyngeal exudate.  Eyes: Conjunctivae are normal. No scleral icterus.  Neck: Neck supple.  Cardiovascular: Normal rate, regular rhythm, normal heart sounds, intact distal pulses and normal pulses.  No lower leg edema  Pulmonary/Chest: Effort normal and breath sounds normal.  Abdominal: Soft. Normal appearance and bowel sounds are normal. There is tenderness.  Lymphadenopathy:    She has no cervical adenopathy.  Neurological: She is alert and oriented to person, place, and time.  Skin: Skin is warm and dry.  Psychiatric: She has a normal mood and affect.    Data Reviewed Upper and lower endoscopy dated June 07, 2018 completed by Madolyn Frieze, MD were reviewed.  EGD was normal. Gastric biopsies were negative for H. pylori.  Colonoscopic biopsies of the ileal mucosa showed no abnormality.  No evidence of inflammatory bowel disease.  No intraluminal lesions identified.  Biopsy showed no evidence of idiopathic inflammatory bowel disease or lymphocytic colitis.  GI evaluation notes of May 13, 2018 and follow-up notes of June 17, 2018 completed by Laurine Blazer, PA were reviewed.  Trial of nonabsorbable antibiotics for possible bacterial overgrowth did not produce any benefit.  No improvement with the use of acid reduction medications.  Outpatient GI evaluation by Vonda Antigua, MD of September 24, 2017 reviewed as well as extensive laboratory testing of June 2019.  Patient made that the patient had a history of depression.  H. pylori testing was negative.  Clinical impression at the time of  the April 15, 2018 follow-up was that the symptoms were functional in nature.  Abdominal ultrasound dated April 24, 2018 was normal.  HIDA scan with stimulation dated May 23, 2018 showed an ejection fraction of 88% with reports of abdominal pain and nausea after ingestion of Ensure.  Assessment    Abdominal pain, bloating and vomiting without clear evidence of biliary source.  Multiple diagnostic/laboratory studies without clear guidance to management.    Plan    I asked the patient in the presence of her mother if there is anything to be gained by her being ill.  She denied that this was the case.  The family is anxious to have him plan so the child can return to school in [place], [place] in the next few weeks and be a Radiographer, therapeutic.  The GI service has put in a consult to GI at [facility] and  [facility], and is waiting to hear back.  We will arrange for a CT scan of the abdomen and pelvis, although I am doubtful this will yield an occult source for her symptoms.  With her history of migraines, the possibility of some sort of vascular compromise or celiac axis compression is present, and would be delineated by this study.  While I empathize with the patient and her mother regarding this ongoing illness, I did advised him that as there is a known risk of injury to the common bile duct as well as complications from anesthesia or laparoscopy itself, we needed to have some clear indication that her symptoms would be relieved by cholecystectomy.  Schedule CT scan, if normal consider refer to [facility] or [facility]     HPI, Physical Exam, Assessment and Plan have been scribed under the direction and in the presence of Robert Bellow, MD. Karie Fetch, RN  I have completed the exam and reviewed the above documentation for accuracy and completeness.  I agree with the above.  Haematologist has been used and any errors in dictation or transcription are unintentional.  Hervey Ard, M.D.,  F.A.C.S.  The patient is scheduled for a CT abdomen pelvis with contrast at [place] on 06/24/18 at 1:00 pm. She will arrive by 12:45 pm and have nothing to eat or drink prior. She will need to pick up a prep kit at least 2 days prior. The patient is aware of date, time, and instructions.  Documented by Caryl-Lyn Otis Brace LPN  Sara Gomez 06/19/2018, 6:52 PM

## 2018-06-18 NOTE — Patient Instructions (Addendum)
The patient is aware to call back for any questions or new concerns.  Schedule CT scan  The patient is scheduled for a CT abdomen pelvis with contrast at [place] on 06/24/18 at 1:00 pm. She will arrive by 12:45 pm and have nothing to eat or drink prior. She will need to pick up a prep kit at least 2 days prior. The patient is aware of date, time, and instructions.

## 2018-06-19 ENCOUNTER — Encounter: Payer: Self-pay | Admitting: Physical Therapy

## 2018-06-19 ENCOUNTER — Encounter: Payer: BLUE CROSS/BLUE SHIELD | Admitting: Physical Therapy

## 2018-06-19 ENCOUNTER — Ambulatory Visit: Payer: BLUE CROSS/BLUE SHIELD | Admitting: Physical Therapy

## 2018-06-19 DIAGNOSIS — R634 Abnormal weight loss: Secondary | ICD-10-CM | POA: Insufficient documentation

## 2018-06-19 DIAGNOSIS — M25612 Stiffness of left shoulder, not elsewhere classified: Secondary | ICD-10-CM

## 2018-06-19 DIAGNOSIS — M25611 Stiffness of right shoulder, not elsewhere classified: Secondary | ICD-10-CM

## 2018-06-19 DIAGNOSIS — G8929 Other chronic pain: Secondary | ICD-10-CM

## 2018-06-19 DIAGNOSIS — M25512 Pain in left shoulder: Secondary | ICD-10-CM

## 2018-06-19 DIAGNOSIS — R112 Nausea with vomiting, unspecified: Secondary | ICD-10-CM | POA: Insufficient documentation

## 2018-06-19 DIAGNOSIS — R1084 Generalized abdominal pain: Secondary | ICD-10-CM | POA: Insufficient documentation

## 2018-06-19 DIAGNOSIS — M25511 Pain in right shoulder: Secondary | ICD-10-CM

## 2018-06-19 DIAGNOSIS — M6281 Muscle weakness (generalized): Secondary | ICD-10-CM

## 2018-06-19 NOTE — Therapy (Signed)
[facility] [address] [place], [place], [address] Phone: [phone]   Fax:  [phone]  Physical Therapy Treatment  Patient Details  Name: Sara Gomez MRN: [id number] Date of Birth: Feb 17, 1996 Referring Provider: Dr. Jeannie Fend   Encounter Date: 06/19/2018  PT End of Session - 06/19/18 0740    Visit Number  14    Number of Visits  21    Date for PT Re-Evaluation  07/11/18    PT Start Time  0734    PT Stop Time  0824    PT Time Calculation (min)  50 min    Activity Tolerance  Patient tolerated treatment well;Patient limited by pain    Behavior During Therapy  [facility] for tasks assessed/performed       Past Medical History:  Diagnosis Date  . Abdominal pain 05/23/2018  . Nausea and vomiting 05/23/2018   Per [facility] Hepato w Eject Fract order    Past Surgical History:  Procedure Laterality Date  . COLONOSCOPY  06/07/2018   Dr Alice Reichert  . SHOULDER ARTHROSCOPY     x2  . SHOULDER LATERJET     x3  . TONSILLECTOMY  2005  . UPPER GI ENDOSCOPY  06/07/2018   Dr Alice Reichert  . WISDOM TOOTH EXTRACTION      There were no vitals filed for this visit.  Subjective Assessment - 06/19/18 0737    Subjective  Pt. reports soreness in R shoulder today.  Pt. reports not being compliant with HEP due to doctor visits.  Pt. has CT for GI issues next Monday.  Pt. returns to school next Friday.      Pertinent History  See previous PMHx.  Pt. known well to PT clinic.      Limitations  Lifting;Standing;Writing;House hold activities    Patient Stated Goals  Increase R and L shoulder ROM/ strength to improve pain-free mobility.      Currently in Pain?  Yes    Pain Score  2     Pain Location  Shoulder    Pain Orientation  Right    Pain Descriptors / Indicators  Aching;Sore    Pain Type  Surgical pain    Pain Onset  More than a month ago          TREATMENT  Ther-ex Supine R/ sh. Rhythmic stabs. (min. To mod. Resistance)- all planes Standing  YTB:              Tricep Ext. 2x15             Scapular Retraction 2x15  B Shoulder ER x10 Supine R sh. Manual isometrics all planes as tolerated 10x. (pt. has limited effort) Standing bodyblade (multiple planes)- L/R  Manual Therapy SupineR shoulder AAROM/stretches (all planes),per MD protocol on R.  Prone Grade II-III Unilateral PA's to patient comfort   Issued new HEP     PT Long Term Goals - 06/13/18 1733      PT LONG TERM GOAL #1   Title  Pt. I with HEP to increase R shoulder AROM to [facility] as compared to L shoulder to improve pain-free mobility.      Baseline  B shoulder AROM WFL.  Pain limited with end-range R sh. flexion/ abd.      Time  4    Period  Weeks    Status  Partially Met    Target Date  07/11/18      PT LONG TERM GOAL #2   Title  Pt. will increase R deltoid/ biceps/ triceps strength to grossly 4/5 MMT to improve return to household tasks/ running.     Baseline  L shoulder strength grossly 3+/5 MMT, biceps/triceps 4/5 MMT.  No R shoulder strength limited (poor effort today).      Time  4    Period  Weeks    Status  Not Met    Target Date  07/11/18      PT LONG TERM GOAL #3   Title  Pt. will increase FOTO to 64 to improve R shoulder mobility.      Baseline  FOTO: baseling 42/ goal 64    Time  4    Period  Weeks    Status  On-going    Target Date  07/11/18      PT LONG TERM GOAL #4   Title  Pt. able to complete all work related tasks with no R shoulder limitations or pain to promote return to PLOF.      Baseline  Pt. completes work related tasks with minimal limitations/ slight increase in pain.     Time  4    Period  Weeks    Status  Not Met    Target Date  07/11/18            Plan - 06/19/18 0740    Clinical Impression Statement  Pt. continues to require maximal motivation in order to perform ther ex during session.  Pt. is able to perform resistance exercises with YTB and experiences shaking from muscle weakness.  Pt. given HEP today to  encourage pt. to do more ther ex at home in order to promote strengthening of shoulder.    Clinical Presentation  Stable    Clinical Decision Making  Low    Rehab Potential  Excellent    PT Frequency  2x / week    PT Duration  4 weeks    PT Treatment/Interventions  ADLs/Self Care Home Management;Aquatic Therapy;Cryotherapy;Electrical Stimulation;Therapeutic exercise;Patient/family education;Neuromuscular re-education;Manual techniques;Scar mobilization;Passive range of motion    PT Next Visit Plan  Progress strength training.      PT Home Exercise Plan  See handouts       Patient will benefit from skilled therapeutic intervention in order to improve the following deficits and impairments:  Pain, Decreased mobility, Decreased scar mobility, Hypomobility, Decreased strength, Decreased range of motion, Decreased endurance, Decreased activity tolerance, Impaired flexibility  Visit Diagnosis: Acute pain of right shoulder  Muscle weakness (generalized)  Shoulder joint stiffness, right  Chronic left shoulder pain  Shoulder joint stiffness, left  Acute pain of left shoulder     Problem List Patient Active Problem List   Diagnosis Date Noted  . Headache 12/18/2012  . Hypothermia 12/18/2012   Pura Spice, PT, DPT # [id number] [place], SPT 06/19/2018, 5:45 PM  [facility] [address] [place], [place], [address] Phone: [phone]   Fax:  [phone]  Name: Sara Gomez MRN: [id number] Date of Birth: 06-23-1996

## 2018-06-24 ENCOUNTER — Ambulatory Visit
Admission: RE | Admit: 2018-06-24 | Discharge: 2018-06-24 | Disposition: A | Discharge status: Home / Self Care | Payer: BLUE CROSS/BLUE SHIELD | Source: Ambulatory Visit | Attending: General Surgery | Admitting: General Surgery

## 2018-06-24 ENCOUNTER — Ambulatory Visit: Payer: BLUE CROSS/BLUE SHIELD | Admitting: Physical Therapy

## 2018-06-24 ENCOUNTER — Encounter: Payer: Self-pay | Admitting: Physical Therapy

## 2018-06-24 ENCOUNTER — Encounter: Payer: BLUE CROSS/BLUE SHIELD | Admitting: Physical Therapy

## 2018-06-24 DIAGNOSIS — G8929 Other chronic pain: Secondary | ICD-10-CM

## 2018-06-24 DIAGNOSIS — R112 Nausea with vomiting, unspecified: Secondary | ICD-10-CM

## 2018-06-24 DIAGNOSIS — M25511 Pain in right shoulder: Secondary | ICD-10-CM | POA: Diagnosis not present

## 2018-06-24 DIAGNOSIS — M25512 Pain in left shoulder: Secondary | ICD-10-CM

## 2018-06-24 DIAGNOSIS — M25612 Stiffness of left shoulder, not elsewhere classified: Secondary | ICD-10-CM

## 2018-06-24 DIAGNOSIS — M6281 Muscle weakness (generalized): Secondary | ICD-10-CM

## 2018-06-24 DIAGNOSIS — M25611 Stiffness of right shoulder, not elsewhere classified: Secondary | ICD-10-CM

## 2018-06-24 DIAGNOSIS — R1084 Generalized abdominal pain: Secondary | ICD-10-CM | POA: Insufficient documentation

## 2018-06-24 MED ORDER — IOPAMIDOL (ISOVUE-300) INJECTION 61%
100.0000 mL | Freq: Once | INTRAVENOUS | Status: AC | PRN
  Administered 2018-06-24: 100 mL via INTRAVENOUS

## 2018-06-24 NOTE — Therapy (Signed)
[facility] [address] [place], [place], [address] Phone: [phone]   Fax:  [phone]  Physical Therapy Treatment  Patient Details  Name: Sara Gomez MRN: [id number] Date of Birth: 19-Jun-1996 Referring Provider: Dr. Jeannie Fend   Encounter Date: 06/24/2018  PT End of Session - 06/24/18 1218    Visit Number  15    Number of Visits  21    Date for PT Re-Evaluation  07/11/18    PT Start Time  0736    PT Stop Time  0829    PT Time Calculation (min)  53 min    Activity Tolerance  Patient tolerated treatment well;Patient limited by pain    Behavior During Therapy  [facility] for tasks assessed/performed       Past Medical History:  Diagnosis Date  . Abdominal pain 05/23/2018  . Nausea and vomiting 05/23/2018   Per [facility] Hepato w Eject Fract order    Past Surgical History:  Procedure Laterality Date  . COLONOSCOPY  06/07/2018   Dr Alice Reichert  . SHOULDER ARTHROSCOPY     x2  . SHOULDER LATERJET     x3  . TONSILLECTOMY  2005  . UPPER GI ENDOSCOPY  06/07/2018   Dr Alice Reichert  . WISDOM TOOTH EXTRACTION      There were no vitals filed for this visit.  Subjective Assessment - 06/24/18 1213    Subjective  Pt. reports that she will undergo a CT scan today for her abdomen/gallbladder.  Pt. reports no new complaints since previous session.  Pt. will be returning to school at the end of this week.    Pertinent History  See previous PMHx.  Pt. known well to PT clinic.      Limitations  Lifting;Standing;Writing;House hold activities    Patient Stated Goals  Increase R and L shoulder ROM/ strength to improve pain-free mobility.      Currently in Pain?  Yes    Pain Score  2     Pain Location  Shoulder    Pain Orientation  Right    Pain Descriptors / Indicators  Aching;Sore    Pain Type  Surgical pain    Pain Onset  More than a month ago        Treatment  Ther-ex Supine R/ sh. Rhythmic stabs. With 2# weight (min. To mod. Resistance)-  all planes Standing R shoulder with YTB: Tricep Ext. 2x15 Scapular Retraction 2x15             Serratus Punches 2x15  Shoulder ER x10  Shoulder Adduction 2x15  Shoulder PNF Pattern D2 x15 Prone T's and I's 10x each.   Discussed HEP/ importance of a consistent strengthening ex. Program every other day for muscle strengthening.  Use ice as needed.     Manual Therapy SupineR shoulder AAROM/stretches (all planes),per MD protocol on R.  Prone Grade II-III Unilateral PA's to patient comfort Supine UT/ levator stretches.   STM to L UT region as tolerated.      PT Long Term Goals - 06/13/18 1733      PT LONG TERM GOAL #1   Title  Pt. I with HEP to increase R shoulder AROM to [facility] as compared to L shoulder to improve pain-free mobility.      Baseline  B shoulder AROM WFL.  Pain limited with end-range R sh. flexion/ abd.      Time  4    Period  Weeks    Status  Partially  Met    Target Date  07/11/18      PT LONG TERM GOAL #2   Title  Pt. will increase R deltoid/ biceps/ triceps strength to grossly 4/5 MMT to improve return to household tasks/ running.     Baseline  L shoulder strength grossly 3+/5 MMT, biceps/triceps 4/5 MMT.  No R shoulder strength limited (poor effort today).      Time  4    Period  Weeks    Status  Not Met    Target Date  07/11/18      PT LONG TERM GOAL #3   Title  Pt. will increase FOTO to 64 to improve R shoulder mobility.      Baseline  FOTO: baseling 42/ goal 64    Time  4    Period  Weeks    Status  On-going    Target Date  07/11/18      PT LONG TERM GOAL #4   Title  Pt. able to complete all work related tasks with no R shoulder limitations or pain to promote return to PLOF.      Baseline  Pt. completes work related tasks with minimal limitations/ slight increase in pain.     Time  4    Period  Weeks    Status  Not Met    Target Date  07/11/18            Plan - 06/24/18 1218    Clinical Impression Statement   Motivation was needed again for pt. to perform exercises.  Pt. performs well when demonstrating exercises, however does not like to perform all sets as described.  Pt. performed well with supine serratus punch along with 3# weight to increase resistance.  Pt. continues to have good range in most planes, with slight pain and discomfort with ER of R shoulder.  Full cervical rotation AROM in supine position but R UT trigger point noted (point tenderness).  Pt. returns to [place] on 8/16 and will be discharged from PT next visit secondary to returning to school.       Clinical Presentation  Stable    Clinical Decision Making  Low    Rehab Potential  Excellent    PT Frequency  2x / week    PT Duration  4 weeks    PT Treatment/Interventions  ADLs/Self Care Home Management;Aquatic Therapy;Cryotherapy;Electrical Stimulation;Therapeutic exercise;Patient/family education;Neuromuscular re-education;Manual techniques;Scar mobilization;Passive range of motion    PT Next Visit Plan  Progress strength training.      PT Home Exercise Plan  See handouts       Patient will benefit from skilled therapeutic intervention in order to improve the following deficits and impairments:  Pain, Decreased mobility, Decreased scar mobility, Hypomobility, Decreased strength, Decreased range of motion, Decreased endurance, Decreased activity tolerance, Impaired flexibility  Visit Diagnosis: Acute pain of right shoulder  Muscle weakness (generalized)  Shoulder joint stiffness, right  Chronic left shoulder pain  Shoulder joint stiffness, left  Acute pain of left shoulder     Problem List Patient Active Problem List   Diagnosis Date Noted  . Generalized abdominal pain 06/19/2018  . Nausea and vomiting 06/19/2018  . Loss of weight 06/19/2018  . Headache 12/18/2012  . Hypothermia 12/18/2012   Pura Spice, PT, DPT # [id number] [place], SPT 06/24/2018, 2:34 PM  [facility] [address] [place], [place], [address] Phone: [phone]   Fax:  [phone]  Name: Sara Gomez MRN: [id number] Date of Birth: [birth year]

## 2018-06-26 ENCOUNTER — Ambulatory Visit: Payer: BLUE CROSS/BLUE SHIELD | Admitting: Physical Therapy

## 2018-06-26 ENCOUNTER — Encounter: Payer: BLUE CROSS/BLUE SHIELD | Admitting: Physical Therapy

## 2018-06-26 DIAGNOSIS — M6281 Muscle weakness (generalized): Secondary | ICD-10-CM

## 2018-06-26 DIAGNOSIS — M25611 Stiffness of right shoulder, not elsewhere classified: Secondary | ICD-10-CM

## 2018-06-26 DIAGNOSIS — M25511 Pain in right shoulder: Secondary | ICD-10-CM

## 2018-06-26 DIAGNOSIS — G8929 Other chronic pain: Secondary | ICD-10-CM

## 2018-06-26 DIAGNOSIS — M25512 Pain in left shoulder: Secondary | ICD-10-CM

## 2018-06-27 ENCOUNTER — Telehealth: Payer: Self-pay

## 2018-06-27 NOTE — Telephone Encounter (Signed)
Notified patient as instructed, patient pleased. Discussed visit with GI and she will be having a gastric emptying study as well as starting a gastroparesis diet.

## 2018-06-27 NOTE — Telephone Encounter (Signed)
-----   Message from Earline MayotteJeffrey W Byrnett, MD sent at 06/27/2018  2:29 PM EDT ----- Notify patient CT was normal.  [facility]. Did they have any insight? ----- Message ----- From: Interface, Rad Results In Sent: 06/24/2018   2:08 PM EDT To: Earline MayotteJeffrey W Byrnett, MD

## 2018-06-28 NOTE — Therapy (Addendum)
[facility] [address] [place], [place], [address] Phone: [phone]   Fax:  [phone]  Physical Therapy Treatment  Patient Details  Name: Sara Gomez Malcolm MRN: [id number] Date of Birth: 01/04/1996 Referring Provider: Dr. Jeannie Fend   Encounter Date: 06/26/2018  PT End of Session - 07/15/18 1849    Visit Number  16    Number of Visits  21    Date for PT Re-Evaluation  07/11/18    PT Start Time  0732    PT Stop Time  0829    PT Time Calculation (min)  57 min    Activity Tolerance  Patient tolerated treatment well;Patient limited by pain    Behavior During Therapy  [facility] for tasks assessed/performed       Past Medical History:  Diagnosis Date  . Abdominal pain 05/23/2018  . Nausea and vomiting 05/23/2018   Per NM Hepato w Eject Fract order    Past Surgical History:  Procedure Laterality Date  . COLONOSCOPY  06/07/2018   Dr Alice Reichert  . SHOULDER ARTHROSCOPY     x2  . SHOULDER LATERJET     x3  . TONSILLECTOMY  2005  . UPPER GI ENDOSCOPY  06/07/2018   Dr Alice Reichert  . WISDOM TOOTH EXTRACTION      There were no vitals filed for this visit.  Subjective Assessment - 07/15/18 1847    Subjective  Pts. abdominal CT was negative.  Pt. reports no new complaints with shoulder.  Limited compliance with HEP.  Pt. leaves for college this Friday.      Pertinent History  See previous PMHx.  Pt. known well to PT clinic.      Limitations  Lifting;Standing;Writing;House hold activities    Patient Stated Goals  Increase R and L shoulder ROM/ strength to improve pain-free mobility.      Currently in Pain?  Yes    Pain Score  2     Pain Location  Shoulder    Pain Orientation  Right    Pain Descriptors / Indicators  Aching          There.ex.:  Reviewed HEP in depth.  Gym based program. Supine sh. Stretches (all planes)- static holds Standing body blade (8 min.) Supine/ seated B shoulder isometrics (all planes).    PT Long Term  Goals - 07/15/18 1853      PT LONG TERM GOAL #1   Title  Pt. I with HEP to increase R shoulder AROM to [facility] as compared to L shoulder to improve pain-free mobility.      Baseline  B shoulder AROM WFL.  Pain limited with end-range R sh. flexion/ abd.      Time  4    Period  Weeks    Status  Partially Met    Target Date  06/26/18      PT LONG TERM GOAL #2   Title  Pt. will increase R deltoid/ biceps/ triceps strength to grossly 4/5 MMT to improve return to household tasks/ running.     Baseline  L shoulder strength grossly 3+/5 MMT, biceps/triceps 4/5 MMT.  No R shoulder strength limited (poor effort today).      Time  4    Period  Weeks    Status  Not Met    Target Date  06/26/18      PT LONG TERM GOAL #3   Title  Pt. will increase FOTO to 64 to improve R shoulder mobility.  Baseline  FOTO: baseling 42/ goal 64    Time  4    Period  Weeks    Status  Not Met    Target Date  06/26/18      PT LONG TERM GOAL #4   Title  Pt. able to complete all work related tasks with no R shoulder limitations or pain to promote return to PLOF.      Baseline  Pt. completes work related tasks with minimal limitations/ slight increase in pain.     Time  4    Period  Weeks    Status  Partially Met    Target Date  06/26/18            Plan - 07/15/18 1849    Clinical Impression Statement  Pt. understands importance of continued HEP/ strength training.  PT recommends pt. continue with a personal trainer at college.  Pt. instructed to contact PT if any questions or issues with progression of sh. strengthening/ progression.      Clinical Presentation  Stable    Clinical Decision Making  Low    Rehab Potential  Excellent    PT Frequency  2x / week    PT Duration  4 weeks    PT Treatment/Interventions  ADLs/Self Care Home Management;Aquatic Therapy;Cryotherapy;Electrical Stimulation;Therapeutic exercise;Patient/family education;Neuromuscular re-education;Manual techniques;Scar mobilization;Passive  range of motion    PT Next Visit Plan  Discharge     PT Home Exercise Plan  See handouts       Patient will benefit from skilled therapeutic intervention in order to improve the following deficits and impairments:  Pain, Decreased mobility, Decreased scar mobility, Hypomobility, Decreased strength, Decreased range of motion, Decreased endurance, Decreased activity tolerance, Impaired flexibility  Visit Diagnosis: Acute pain of right shoulder  Muscle weakness (generalized)  Shoulder joint stiffness, right  Chronic left shoulder pain     Problem List Patient Active Problem List   Diagnosis Date Noted  . Generalized abdominal pain 06/19/2018  . Nausea and vomiting 06/19/2018  . Loss of weight 06/19/2018  . Headache 12/18/2012  . Hypothermia 12/18/2012   Pura Spice, PT, DPT # [phone] 07/15/2018, 6:54 PM  [facility] [address] [place], [place], [address] Phone: [phone]   Fax:  [phone]  Name: Mackinzee Roszak Etienne MRN: [id number] Date of Birth: 1996/06/02

## 2018-07-01 ENCOUNTER — Encounter: Payer: BLUE CROSS/BLUE SHIELD | Admitting: Physical Therapy

## 2018-07-03 ENCOUNTER — Encounter: Payer: BLUE CROSS/BLUE SHIELD | Admitting: Physical Therapy

## 2018-07-09 IMAGING — US US ABDOMEN COMPLETE
1 series · 14 of 25 positions shown · non-contrast
Comparison: None.

CLINICAL DATA: Abdominal pain and bloating for 10 months.

EXAM:
ABDOMEN ULTRASOUND COMPLETE

[Series 1: us abdomen complete · 0.17mm/px · 14 of 93 slices shown]
[im 1/93]
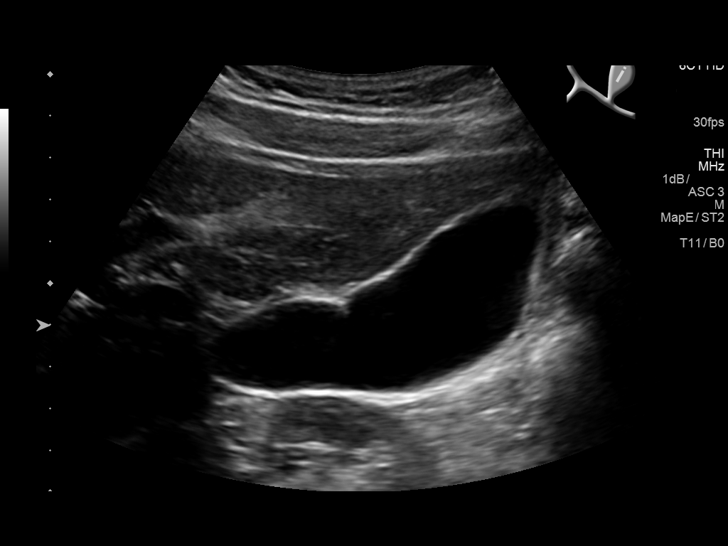
[im 8/93]
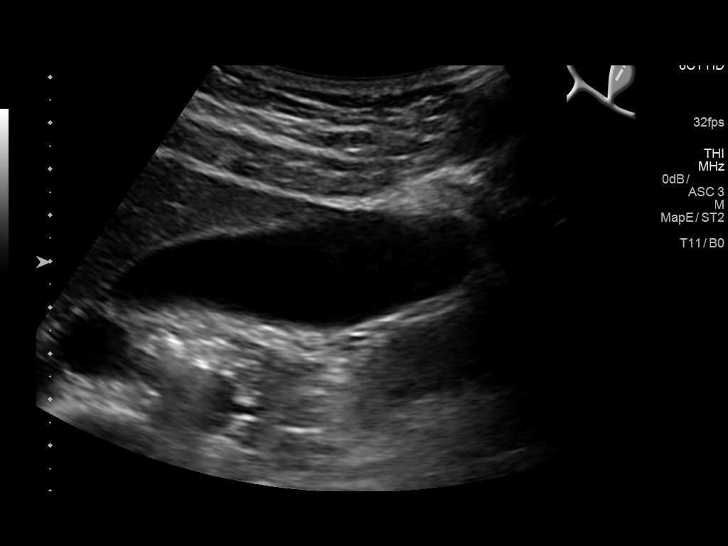
[im 16/93]
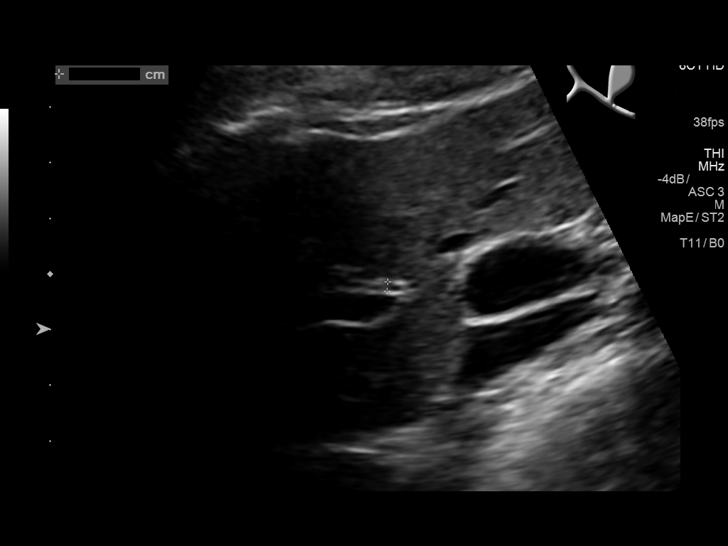
[im 24/93]
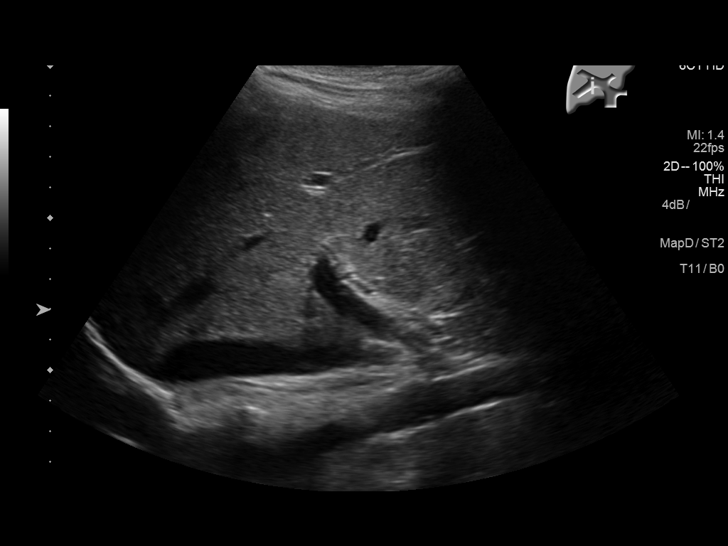
[im 31/93]
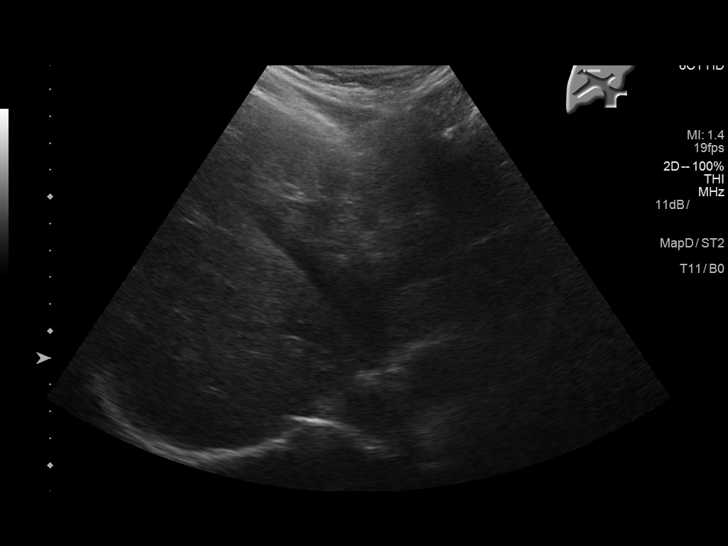
[im 35/93]
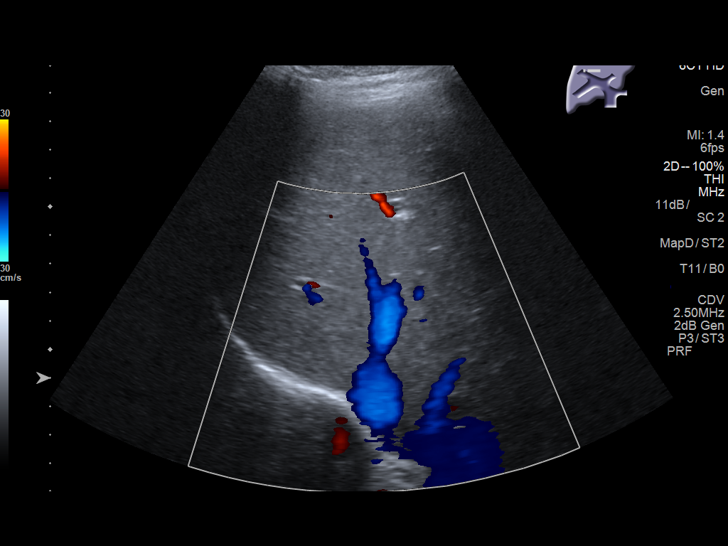
[im 43/93]
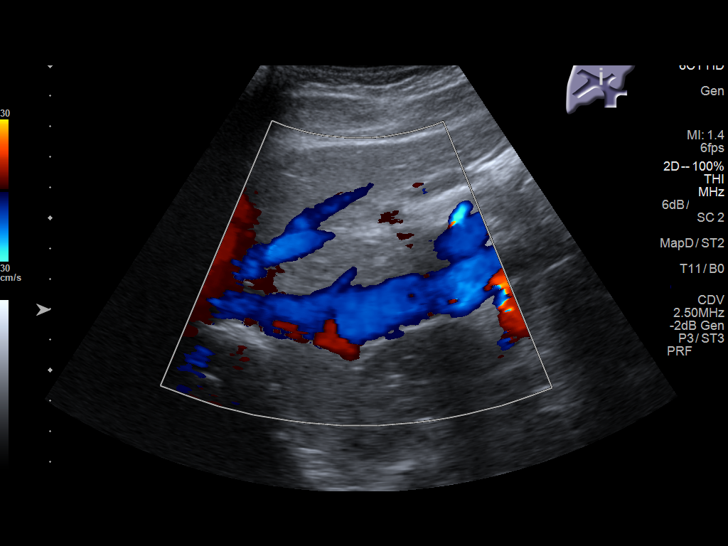
[im 50/93]
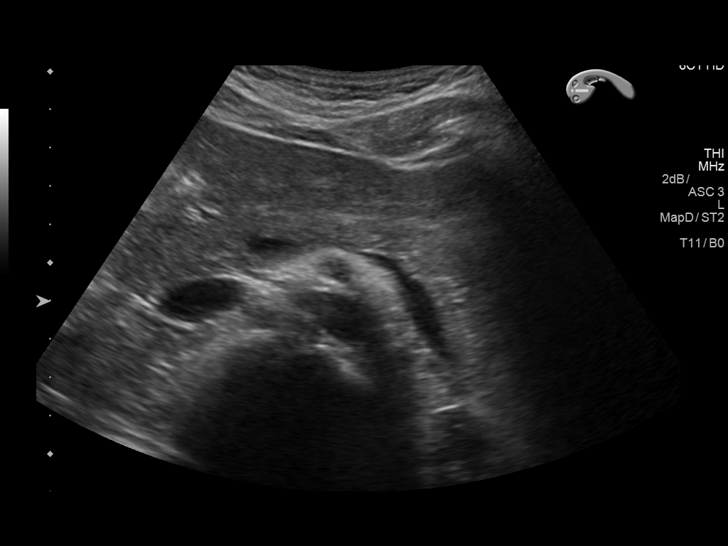
[im 58/93]
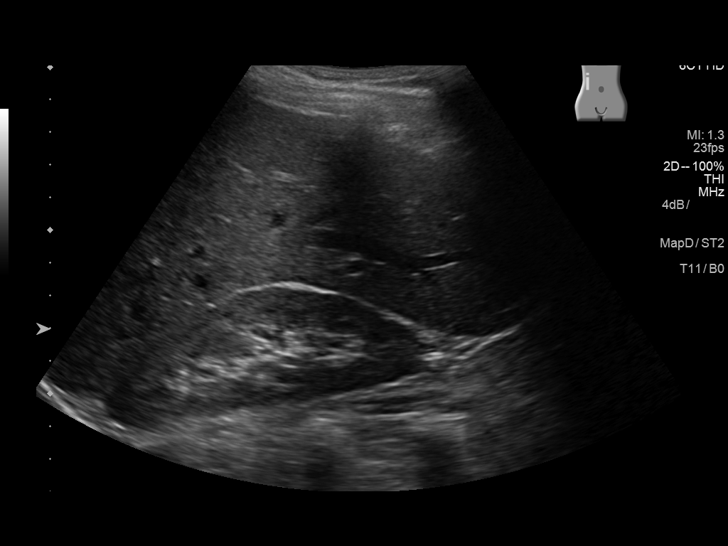
[im 62/93]
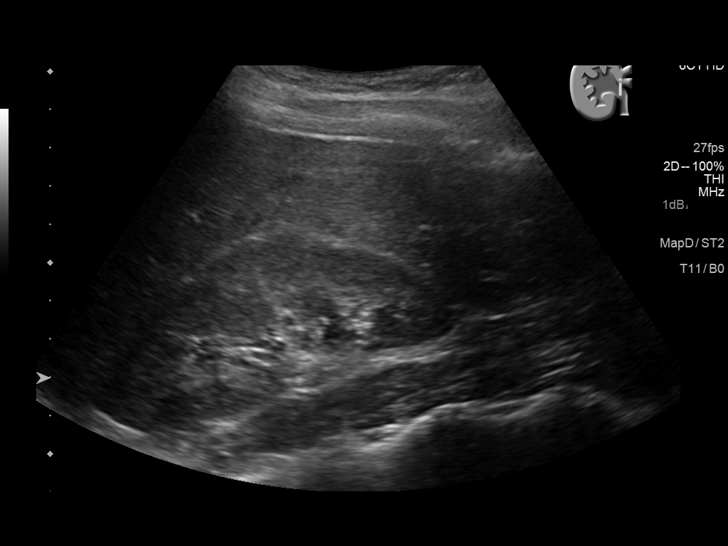
[im 70/93]
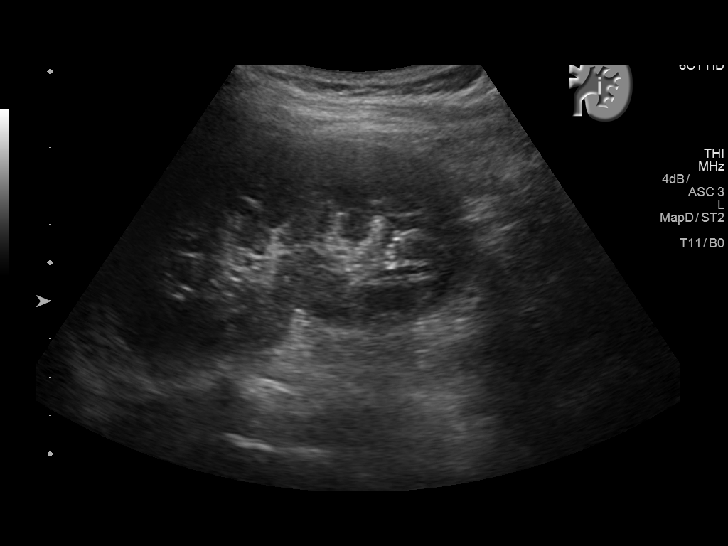
[im 77/93]
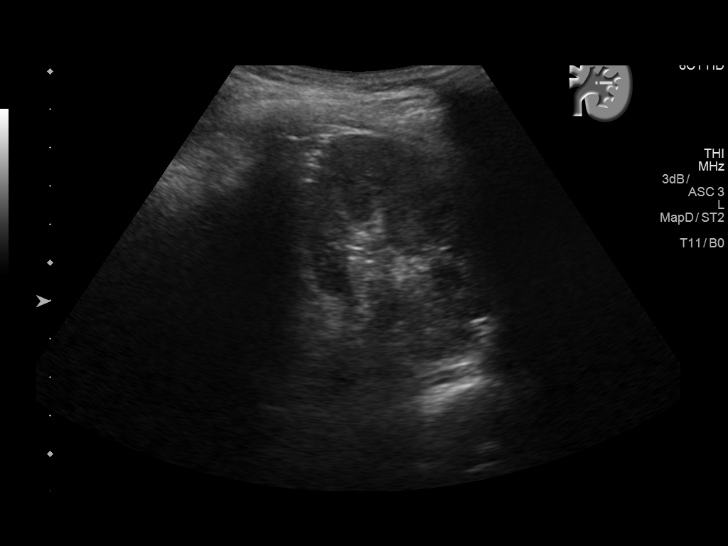
[im 85/93]
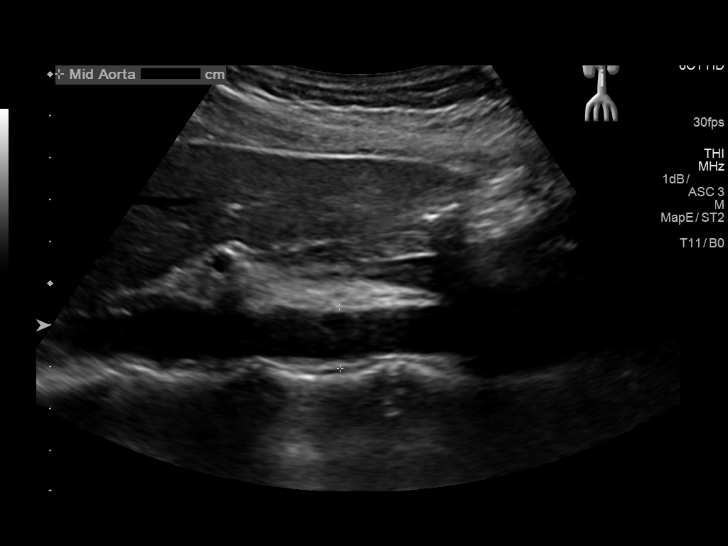
[im 93/93]
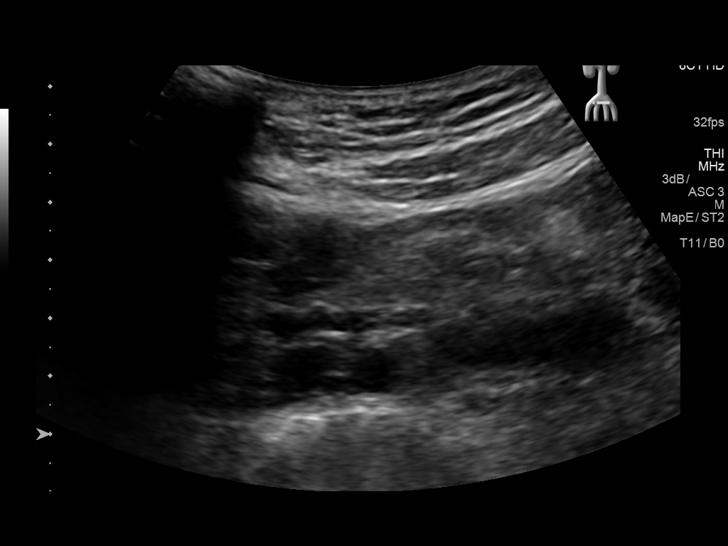

[14 of 25 positions shown; findings below may reference images not displayed]

FINDINGS: Gallbladder: No gallstones or wall thickening visualized. No
sonographic Murphy sign noted by sonographer.

Common bile duct: Diameter: 2 mm, within normal limits.

Liver: No focal lesion identified. Within normal limits in
parenchymal echogenicity. Portal vein is patent on color Doppler
imaging with normal direction of blood flow towards the liver.

IVC: No abnormality visualized.

Pancreas: Visualized portion unremarkable.

Spleen: Size and appearance within normal limits.

Right Kidney: Length: 10.2 cm. Echogenicity within normal limits. No
mass or hydronephrosis visualized.

Left Kidney: Length: 10.1 cm. Echogenicity within normal limits. No
mass or hydronephrosis visualized.

Abdominal aorta: No aneurysm visualized.

Other findings: None.
IMPRESSION: Normal exam.

## 2019-01-22 ENCOUNTER — Ambulatory Visit: Payer: BLUE CROSS/BLUE SHIELD | Attending: Orthopedic Surgery | Admitting: Physical Therapy

## 2019-01-22 ENCOUNTER — Encounter: Payer: Self-pay | Admitting: Physical Therapy

## 2019-01-22 DIAGNOSIS — M25512 Pain in left shoulder: Secondary | ICD-10-CM | POA: Diagnosis present

## 2019-01-22 DIAGNOSIS — M6281 Muscle weakness (generalized): Secondary | ICD-10-CM | POA: Insufficient documentation

## 2019-01-22 DIAGNOSIS — M25612 Stiffness of left shoulder, not elsewhere classified: Secondary | ICD-10-CM | POA: Insufficient documentation

## 2019-01-25 NOTE — Therapy (Addendum)
[facility] [address] [place], [place], [address] Phone: [phone]   Fax:  [phone]  Physical Therapy Evaluation  Patient Details  Name: Sara Gomez MRN: [id number] Date of Birth: 1996-05-01 Referring Provider (PT): Dr. Roney Mans   Encounter Date: 01/22/2019  PT End of Session - 02/24/19 1131    Visit Number  1    Number of Visits  24    Date for PT Re-Evaluation  04/16/19    PT Start Time  0812    PT Stop Time  0915    PT Time Calculation (min)  63 min    Activity Tolerance  Patient tolerated treatment well;Patient limited by pain    Behavior During Therapy  [facility] for tasks assessed/performed       Past Medical History:  Diagnosis Date  . Abdominal pain 05/23/2018  . Nausea and vomiting 05/23/2018   Per NM Hepato w Eject Fract order    Past Surgical History:  Procedure Laterality Date  . COLONOSCOPY  06/07/2018   Dr Norma Fredrickson  . SHOULDER ARTHROSCOPY     x2  . SHOULDER LATERJET     x3  . TONSILLECTOMY  2005  . UPPER GI ENDOSCOPY  06/07/2018   Dr Norma Fredrickson  . WISDOM TOOTH EXTRACTION      There were no vitals filed for this visit.   Subjective Assessment - 02/24/19 1127    Subjective  Pt. s/p L shoulder surgery on 12/10/2018.  See MD notes/ protocol.  Pt. reports 6/10 L shoulder pain at rest (no sling).      Pertinent History  See previous PMHx.  Pt. known well to PT clinic.      Limitations  Lifting;Standing;Writing;House hold activities    Patient Stated Goals  Increase R and L shoulder ROM/ strength to improve pain-free mobility.      Currently in Pain?  Yes    Pain Score  6     Pain Location  Shoulder    Pain Orientation  Left    Pain Descriptors / Indicators  Aching    Pain Type  Surgical pain         [facility] PT Assessment - 02/24/19 0001      Assessment   Medical Diagnosis  s/p L Latarget procedure/ ICBG, R shoulder pain.      Referring Provider (PT)  Dr. Roney Mans    Onset Date/Surgical Date   12/10/18    Hand Dominance  Right    Prior Therapy  yes      Precautions   Precautions  Shoulder      Prior Function   Level of Independence  Independent        See HEP  Manual tx.:  Supine L shoulder AA/PROM all planes (see MD protocol).   Discussed/ demonstrated scar massage to sh./ hip    PT Education - 02/24/19 1131    Education provided  Yes    Education Details  See HEP    Person(s) Educated  Patient    Methods  Explanation;Demonstration;Handout    Comprehension  Verbalized understanding;Returned demonstration          PT Long Term Goals - 02/24/19 1137      PT LONG TERM GOAL #1   Title  Pt. I with HEP to increase L shoulder AROM to [facility] as compared to R shoulder to improve pain-free mobility.      Baseline  Supine R shoulder AROM WFL. Supine L shoulder AAROM: flexion (98  deg.), abduction (60 deg.), ER (4 deg.), IR (70 deg.).     Time  12    Period  Weeks    Status  New    Target Date  04/16/19      PT LONG TERM GOAL #2   Title  Pt. will increase L deltoid/ biceps/ triceps strength to grossly 4/5 MMT to improve return to household tasks/ running.     Baseline  L shoulder strength grossly 3+/5 MMT, biceps/triceps 4/5 MMT.     Time  12    Period  Weeks    Status  New    Target Date  04/16/19      PT LONG TERM GOAL #3   Title  Pt. will increase FOTO to 68 to improve R shoulder mobility.      Baseline  FOTO: baseling 46/ goal 68    Time  12    Period  Weeks    Status  New    Target Date  04/16/19      PT LONG TERM GOAL #4   Title  Pt. able to complete all work related tasks with no shoulder limitations or pain to promote return to PLOF.      Baseline  Pt. completes work related tasks with limitations/ increase in pain.     Time  12    Period  Weeks    Status  New    Target Date  04/16/19         Pt. is a pleasant 23 y/o female s/p L Latarjet procedure on 12/10/2018.  Pt. known well to PT secondary to multiple L and R sh. shoulder  issues/surgeries.  Pt. is currently 6 weeks s/p L shoulder surgery and arrived to PT without use of sling.  Pt. has specific MD protocol for progression of ROM/ stability ex.  Pt. not sleeping well at night and reports 7/10 L shoulder pain at rest.  PT explained MD protocol and importance of compliance with HEP to improve L shoulder recovery.  Supine R shoulder AROM WFL.  Supine L shoulder AAROM:  flexion (98 deg.), abduction (60 deg.), ER (4 deg.), IR (70 deg.).  Grip: L 29.2#/ R 52.1#.  FOTO: baseline 46/ goal 68.  Pt. presents with good incision healing with mild tenderness with palpation around distal aspect of incision.  Pt. will benefit from skilled PT services to increase R and L shoulder AROM/ strength to improve functional mobility.           Patient will benefit from skilled therapeutic intervention in order to improve the following deficits and impairments:  Pain, Decreased mobility, Decreased scar mobility, Hypomobility, Decreased strength, Decreased range of motion, Decreased endurance, Decreased activity tolerance, Impaired flexibility  Visit Diagnosis: Shoulder joint stiffness, left  Acute pain of left shoulder  Muscle weakness (generalized)     Problem List Patient Active Problem List   Diagnosis Date Noted  . Generalized abdominal pain 06/19/2018  . Nausea and vomiting 06/19/2018  . Loss of weight 06/19/2018  . Headache 12/18/2012  . Hypothermia 12/18/2012   Sara Gomez, PT, DPT # [phone] 02/24/2019, 11:42 AM  [facility] [address] [place], [place], [address] Phone: [phone]   Fax:  [phone]  Name: Sara Gomez MRN: [id number] Date of Birth: 29-Oct-1996

## 2019-01-27 ENCOUNTER — Ambulatory Visit: Payer: BLUE CROSS/BLUE SHIELD | Admitting: Physical Therapy

## 2019-01-27 ENCOUNTER — Other Ambulatory Visit: Payer: Self-pay

## 2019-01-27 DIAGNOSIS — M25612 Stiffness of left shoulder, not elsewhere classified: Secondary | ICD-10-CM | POA: Diagnosis not present

## 2019-01-27 DIAGNOSIS — M6281 Muscle weakness (generalized): Secondary | ICD-10-CM

## 2019-01-27 DIAGNOSIS — M25512 Pain in left shoulder: Secondary | ICD-10-CM

## 2019-01-29 ENCOUNTER — Other Ambulatory Visit: Payer: Self-pay

## 2019-01-29 ENCOUNTER — Ambulatory Visit: Payer: BLUE CROSS/BLUE SHIELD | Admitting: Physical Therapy

## 2019-01-29 DIAGNOSIS — M25612 Stiffness of left shoulder, not elsewhere classified: Secondary | ICD-10-CM | POA: Diagnosis not present

## 2019-01-29 DIAGNOSIS — M25512 Pain in left shoulder: Secondary | ICD-10-CM

## 2019-01-29 DIAGNOSIS — M6281 Muscle weakness (generalized): Secondary | ICD-10-CM

## 2019-01-30 NOTE — Therapy (Addendum)
[facility] [address] [place], [place], [address] Phone: [phone]   Fax:  [phone]  Physical Therapy Treatment  Patient Details  Name: Sara Gomez MRN: [id number] Date of Birth: [birth year] Referring Provider (PT): Dr. Roney Mans   Encounter Date: 01/27/2019  PT End of Session - 03/05/19 0946    Visit Number  2    Number of Visits  24    Date for PT Re-Evaluation  04/16/19    PT Start Time  0731    PT Stop Time  0833    PT Time Calculation (min)  62 min    Activity Tolerance  Patient tolerated treatment well;Patient limited by pain    Behavior During Therapy  [facility] for tasks assessed/performed       Past Medical History:  Diagnosis Date  . Abdominal pain 05/23/2018  . Nausea and vomiting 05/23/2018   Per NM Hepato w Eject Fract order    Past Surgical History:  Procedure Laterality Date  . COLONOSCOPY  06/07/2018   Dr Norma Fredrickson  . SHOULDER ARTHROSCOPY     x2  . SHOULDER LATERJET     x3  . TONSILLECTOMY  2005  . UPPER GI ENDOSCOPY  06/07/2018   Dr Norma Fredrickson  . WISDOM TOOTH EXTRACTION      There were no vitals filed for this visit.  Subjective Assessment - 03/05/19 0944    Subjective  Pt. continues to report persistent discomfort in L shoulder.  Pt. reports limited compliance with HEP.  PT really emphasized the importance of compliance to promote increase L shoulder ROM/ R shoulder stability.      Pertinent History  See previous PMHx.  Pt. known well to PT clinic.      Limitations  Lifting;Standing;Writing;House hold activities    Patient Stated Goals  Increase R and L shoulder ROM/ strength to improve pain-free mobility.      Currently in Pain?  Yes    Pain Score  5     Pain Location  Shoulder    Pain Orientation  Left         There.ex.:   R shoulder rhythmic stabs (all planes)- mod. Resistance 5x30 sec. R shoulder isometrics: IR/ER/ biceps/ triceps/ flexion 10x10 sec. Holds Reviewed HEP  Manual  tx.:  Supine wand: flexion/ abduction warm-up with PT AAROM Supine L shoulder AA/PROM all planes per MD protocol Scar massage instruction  Ice to L shoulder after tx.     PT Long Term Goals - 02/24/19 1137      PT LONG TERM GOAL #1   Title  Pt. I with HEP to increase L shoulder AROM to [facility] as compared to R shoulder to improve pain-free mobility.      Baseline  Supine R shoulder AROM WFL. Supine L shoulder AAROM: flexion (98 deg.), abduction (60 deg.), ER (4 deg.), IR (70 deg.).     Time  12    Period  Weeks    Status  New    Target Date  04/16/19      PT LONG TERM GOAL #2   Title  Pt. will increase L deltoid/ biceps/ triceps strength to grossly 4/5 MMT to improve return to household tasks/ running.     Baseline  L shoulder strength grossly 3+/5 MMT, biceps/triceps 4/5 MMT.     Time  12    Period  Weeks    Status  New    Target Date  04/16/19  PT LONG TERM GOAL #3   Title  Pt. will increase FOTO to 68 to improve R shoulder mobility.      Baseline  FOTO: baseling 46/ goal 68    Time  12    Period  Weeks    Status  New    Target Date  04/16/19      PT LONG TERM GOAL #4   Title  Pt. able to complete all work related tasks with no shoulder limitations or pain to promote return to PLOF.      Baseline  Pt. completes work related tasks with limitations/ increase in pain.     Time  12    Period  Weeks    Status  New    Target Date  04/16/19            Plan - 03/05/19 0947    Clinical Impression Statement  L shoulder ROM remains limited by joint tightness/ pain.  PT continues to follow MD protocol for ROM and R shoulder stability.  Pt. benefits from skilled PT due to poor compliance with HEP and limited ROM in L shoulder at this time.  Primary focus on L shoulder ROM and R shoulder strengthening.  Good incision healing but tenderness/ sensitivity with scar massage.      Personal Factors and Comorbidities  Age;Fitness    Stability/Clinical Decision Making   Stable/Uncomplicated    Clinical Decision Making  Low    Rehab Potential  Good    PT Frequency  2x / week    PT Duration  12 weeks    PT Treatment/Interventions  ADLs/Self Care Home Management;Aquatic Therapy;Cryotherapy;Electrical Stimulation;Therapeutic exercise;Patient/family education;Neuromuscular re-education;Manual techniques;Scar mobilization;Passive range of motion;Functional mobility training    PT Next Visit Plan  Progress L shoulder AROM (follow MD protocol)    PT Home Exercise Plan  See handouts       Patient will benefit from skilled therapeutic intervention in order to improve the following deficits and impairments:  Pain, Decreased mobility, Decreased scar mobility, Hypomobility, Decreased strength, Decreased range of motion, Decreased endurance, Decreased activity tolerance, Impaired flexibility  Visit Diagnosis: Shoulder joint stiffness, left  Acute pain of left shoulder  Muscle weakness (generalized)     Problem List Patient Active Problem List   Diagnosis Date Noted  . Generalized abdominal pain 06/19/2018  . Nausea and vomiting 06/19/2018  . Loss of weight 06/19/2018  . Headache 12/18/2012  . Hypothermia 12/18/2012   [name], PT, DPT # [phone] 03/05/2019, 9:52 AM  [facility] [address] [place], [place], [address] Phone: [phone]   Fax:  [phone]  Name: Sara Gomez MRN: [id number] Date of Birth: [birth year]

## 2019-01-30 NOTE — Therapy (Addendum)
[facility] [address] [place], [place], [address] Phone: [phone]   Fax:  [phone]  Physical Therapy Treatment  Patient Details  Name: Sara Gomez MRN: [id number] Date of Birth: Jul 17, 1996 Referring Provider (PT): Dr. Roney Mans   Encounter Date: 01/29/2019  PT End of Session - 03/05/19 1636    Visit Number  3    Number of Visits  24    Date for PT Re-Evaluation  04/16/19    PT Start Time  0735    PT Stop Time  0829    PT Time Calculation (min)  54 min    Activity Tolerance  Patient tolerated treatment well;Patient limited by pain    Behavior During Therapy  [facility] for tasks assessed/performed       Past Medical History:  Diagnosis Date  . Abdominal pain 05/23/2018  . Nausea and vomiting 05/23/2018   Per NM Hepato w Eject Fract order    Past Surgical History:  Procedure Laterality Date  . COLONOSCOPY  06/07/2018   Dr Norma Fredrickson  . SHOULDER ARTHROSCOPY     x2  . SHOULDER LATERJET     x3  . TONSILLECTOMY  2005  . UPPER GI ENDOSCOPY  06/07/2018   Dr Norma Fredrickson  . WISDOM TOOTH EXTRACTION      There were no vitals filed for this visit.  Subjective Assessment - 03/05/19 1633    Subjective  Pt. states she is hurting in L shoulder right now.  Pt. is concened about R shoulder becoming loose again.  Good R shoulder AROM noted.      Pertinent History  See previous PMHx.  Pt. known well to PT clinic.      Limitations  Lifting;Standing;Writing;House hold activities    Patient Stated Goals  Increase R and L shoulder ROM/ strength to improve pain-free mobility.      Currently in Pain?  Yes    Pain Score  5     Pain Location  Shoulder    Pain Orientation  Left        There.ex.:  Supine R shoulder rhythmic stabs. (all planes).  L shoulder gentle isometrics: biceps/ tricpes/ scapular retraction with holds (as tolerated) Supine chest press with wand/ shoulder flexion with PT assist Seated scap. Retraction 20x Reviewed  HEP/ handouts  Manual tx.:  Supine L shoulder gentle AP/PA/inf. Grade II-III mobs. (as tolerated)- 5x20 sec. Each. STM to L UT/deltoid/ biceps/ triceps 5 min. L UT/ levator stretches 3x with holds. Supine L shoulder AA/PROM (all planes)- per MD protocol.    PT Long Term Goals - 02/24/19 1137      PT LONG TERM GOAL #1   Title  Pt. I with HEP to increase L shoulder AROM to [facility] as compared to R shoulder to improve pain-free mobility.      Baseline  Supine R shoulder AROM WFL. Supine L shoulder AAROM: flexion (98 deg.), abduction (60 deg.), ER (4 deg.), IR (70 deg.).     Time  12    Period  Weeks    Status  New    Target Date  04/16/19      PT LONG TERM GOAL #2   Title  Pt. will increase L deltoid/ biceps/ triceps strength to grossly 4/5 MMT to improve return to household tasks/ running.     Baseline  L shoulder strength grossly 3+/5 MMT, biceps/triceps 4/5 MMT.     Time  12    Period  Weeks  Status  New    Target Date  04/16/19      PT LONG TERM GOAL #3   Title  Pt. will increase FOTO to 68 to improve R shoulder mobility.      Baseline  FOTO: baseling 46/ goal 68    Time  12    Period  Weeks    Status  New    Target Date  04/16/19      PT LONG TERM GOAL #4   Title  Pt. able to complete all work related tasks with no shoulder limitations or pain to promote return to PLOF.      Baseline  Pt. completes work related tasks with limitations/ increase in pain.     Time  12    Period  Weeks    Status  New    Target Date  04/16/19            Plan - 03/05/19 1637    Clinical Impression Statement  PT focused on L shoulder ROM in a pain tolerable range.  Increase static holds at pain tolerable end-range per MD protocol with L shoulder in supine position.  L shoulder ER limited to 19 deg. and abduciton limited to 60 deg.  Poor biceps/triceps MMT.      Personal Factors and Comorbidities  Age;Fitness    Stability/Clinical Decision Making  Stable/Uncomplicated    Clinical  Decision Making  Low    Rehab Potential  Good    PT Frequency  2x / week    PT Duration  12 weeks    PT Treatment/Interventions  ADLs/Self Care Home Management;Aquatic Therapy;Cryotherapy;Electrical Stimulation;Therapeutic exercise;Patient/family education;Neuromuscular re-education;Manual techniques;Scar mobilization;Passive range of motion;Functional mobility training    PT Next Visit Plan  Progress L shoulder AROM (follow MD protocol)    PT Home Exercise Plan  See handouts       Patient will benefit from skilled therapeutic intervention in order to improve the following deficits and impairments:  Pain, Decreased mobility, Decreased scar mobility, Hypomobility, Decreased strength, Decreased range of motion, Decreased endurance, Decreased activity tolerance, Impaired flexibility  Visit Diagnosis: Shoulder joint stiffness, left  Acute pain of left shoulder  Muscle weakness (generalized)     Problem List Patient Active Problem List   Diagnosis Date Noted  . Generalized abdominal pain 06/19/2018  . Nausea and vomiting 06/19/2018  . Loss of weight 06/19/2018  . Headache 12/18/2012  . Hypothermia 12/18/2012   [name], PT, DPT # [phone] 03/05/2019, 4:44 PM  [facility] [address] [place], [place], [address] Phone: [phone]   Fax:  [phone]  Name: Sara Gomez MRN: [id number] Date of Birth: Dec 14, 1995

## 2019-02-03 ENCOUNTER — Encounter: Payer: BLUE CROSS/BLUE SHIELD | Admitting: Physical Therapy

## 2019-02-05 ENCOUNTER — Encounter: Payer: BLUE CROSS/BLUE SHIELD | Admitting: Physical Therapy

## 2019-02-10 ENCOUNTER — Encounter: Payer: BLUE CROSS/BLUE SHIELD | Admitting: Physical Therapy

## 2019-02-12 ENCOUNTER — Encounter: Payer: BLUE CROSS/BLUE SHIELD | Admitting: Physical Therapy

## 2019-02-17 ENCOUNTER — Encounter: Payer: BLUE CROSS/BLUE SHIELD | Admitting: Physical Therapy

## 2019-02-19 ENCOUNTER — Encounter: Payer: BLUE CROSS/BLUE SHIELD | Admitting: Physical Therapy

## 2019-02-24 ENCOUNTER — Encounter: Payer: BLUE CROSS/BLUE SHIELD | Admitting: Physical Therapy

## 2019-02-24 NOTE — Addendum Note (Signed)
Addended by: [name] on: 02/24/2019 11:48 AM   Modules accepted: Orders

## 2019-02-26 ENCOUNTER — Encounter: Payer: BLUE CROSS/BLUE SHIELD | Admitting: Physical Therapy

## 2019-03-03 ENCOUNTER — Encounter: Payer: BLUE CROSS/BLUE SHIELD | Admitting: Physical Therapy

## 2019-03-05 ENCOUNTER — Encounter: Payer: BLUE CROSS/BLUE SHIELD | Admitting: Physical Therapy

## 2019-03-10 ENCOUNTER — Encounter: Payer: BLUE CROSS/BLUE SHIELD | Admitting: Physical Therapy

## 2019-03-12 ENCOUNTER — Encounter: Payer: BLUE CROSS/BLUE SHIELD | Admitting: Physical Therapy

## 2019-03-24 ENCOUNTER — Other Ambulatory Visit: Payer: Self-pay

## 2019-03-24 ENCOUNTER — Ambulatory Visit: Payer: BLUE CROSS/BLUE SHIELD | Attending: Orthopedic Surgery

## 2019-03-24 DIAGNOSIS — M25611 Stiffness of right shoulder, not elsewhere classified: Secondary | ICD-10-CM | POA: Diagnosis present

## 2019-03-24 DIAGNOSIS — M25511 Pain in right shoulder: Secondary | ICD-10-CM | POA: Diagnosis present

## 2019-03-24 DIAGNOSIS — G8929 Other chronic pain: Secondary | ICD-10-CM | POA: Diagnosis present

## 2019-03-24 DIAGNOSIS — M25612 Stiffness of left shoulder, not elsewhere classified: Secondary | ICD-10-CM | POA: Insufficient documentation

## 2019-03-24 DIAGNOSIS — M6281 Muscle weakness (generalized): Secondary | ICD-10-CM | POA: Insufficient documentation

## 2019-03-24 DIAGNOSIS — M25512 Pain in left shoulder: Secondary | ICD-10-CM | POA: Insufficient documentation

## 2019-03-24 NOTE — Therapy (Signed)
[facility] MAIN [facility] SERVICES [address] [place], [place], [address] Phone: [phone]   Fax:  [phone]  Physical Therapy Treatment  Patient Details  Name: Sara Gomez MRN: [id number] Date of Birth: May 09, 1996 Referring Provider (PT): Dr. Roney Mans   Encounter Date: 03/24/2019  PT End of Session - 03/24/19 1110    Visit Number  4    Number of Visits  24    Date for PT Re-Evaluation  04/16/19    PT Start Time  0810    PT Stop Time  0900    PT Time Calculation (min)  50 min    Activity Tolerance  Patient tolerated treatment well;Patient limited by pain    Behavior During Therapy  [facility] for tasks assessed/performed       Past Medical History:  Diagnosis Date  . Abdominal pain 05/23/2018  . Nausea and vomiting 05/23/2018   Per NM Hepato w Eject Fract order    Past Surgical History:  Procedure Laterality Date  . COLONOSCOPY  06/07/2018   Dr Norma Fredrickson  . SHOULDER ARTHROSCOPY     x2  . SHOULDER LATERJET     x3  . TONSILLECTOMY  2005  . UPPER GI ENDOSCOPY  06/07/2018   Dr Norma Fredrickson  . WISDOM TOOTH EXTRACTION      There were no vitals filed for this visit.  Subjective Assessment - 03/24/19 0809    Subjective  Pt. states she is hurting in L shoulder right now. She rates her current pain as  4/10. Yesterday when she was eating outside the wind knocked over the dining umbrella and it hit her shoulder.  Pt reports that she would like to address some R shoulder pain as well today.     Pertinent History  See previous PMHx.  Pt. known well to PT clinic.      Limitations  Lifting;Standing;Writing;House hold activities    Patient Stated Goals  Increase R and L shoulder ROM/ strength to improve pain-free mobility.      Currently in Pain?  Yes    Pain Score  4     Pain Location  Shoulder    Pain Orientation  Left    Pain Descriptors / Indicators  Aching    Pain Type  Chronic pain;Surgical pain           TREATMENT    Ther-ex   Supine L shoulder rhythmic stabs. (all planes).  Supine chest press with wand and gentle resistance from therapist 2 x 10; Supine R serratus punch with gentle therapist resistance 2 x 10; Seated rows with wand with gentle therapist resistance x 10; Reviewed HEP   Manual Therapy  Supine L shoulder AA/PROM (all planes)- per MD protocol. Supine L shoulder gentle AP Grade II-III mobs. (as tolerated)- 5x20 sec; STM to L UT/deltoid/ biceps/ triceps followed by R side for less time; Bilateral UT/ levator stretches 3x with holds.   Pt educated throughout session about proper posture and technique with exercises. Improved exercise technique, movement at target joints, use of target muscles after min to mod verbal, visual, tactile cues.    Therapy session continued to focus on L shoulder ROM within pain-tolerable limits as she continues to demonstrate poor shoulder mobility. She has not been consistent with her HEP and therapist reinforced the importance of consistent AAROM to restore shoulder mobility. She reports some R upper trap pain upon arrival today so therapist spent a small amount of time with STM to R shoulder. Pt  heavily encouraged to continue her HEP and follow-up as scheduled. Pt will benefit from PT services to address deficits in strength and ROMy in order to return to full function at home.                          PT Long Term Goals - 02/24/19 1137      PT LONG TERM GOAL #1   Title  Pt. I with HEP to increase L shoulder AROM to [facility] as compared to R shoulder to improve pain-free mobility.      Baseline  Supine R shoulder AROM WFL. Supine L shoulder AAROM: flexion (98 deg.), abduction (60 deg.), ER (4 deg.), IR (70 deg.).     Time  12    Period  Weeks    Status  New    Target Date  04/16/19      PT LONG TERM GOAL #2   Title  Pt. will increase L deltoid/ biceps/ triceps strength to grossly 4/5 MMT to improve return to household tasks/ running.     Baseline   L shoulder strength grossly 3+/5 MMT, biceps/triceps 4/5 MMT.     Time  12    Period  Weeks    Status  New    Target Date  04/16/19      PT LONG TERM GOAL #3   Title  Pt. will increase FOTO to 68 to improve R shoulder mobility.      Baseline  FOTO: baseling 46/ goal 68    Time  12    Period  Weeks    Status  New    Target Date  04/16/19      PT LONG TERM GOAL #4   Title  Pt. able to complete all work related tasks with no shoulder limitations or pain to promote return to PLOF.      Baseline  Pt. completes work related tasks with limitations/ increase in pain.     Time  12    Period  Weeks    Status  New    Target Date  04/16/19            Plan - 03/24/19 1112    Clinical Impression Statement  Therapy session continued to focus on L shoulder ROM within pain-tolerable limits as she continues to demonstrate poor shoulder mobility. She has not been consistent with her HEP and therapist reinforced the importance of consistent AAROM to restore shoulder mobility. She reports some R upper trap pain upon arrival today so therapist spent a small amount of time with STM to R shoulder. Pt heavily encouraged to continue her HEP and follow-up as scheduled. Pt will benefit from PT services to address deficits in strength and ROMy in order to return to full function at home.     Personal Factors and Comorbidities  Age;Fitness    Stability/Clinical Decision Making  Stable/Uncomplicated    Rehab Potential  Good    PT Frequency  2x / week    PT Duration  12 weeks    PT Treatment/Interventions  ADLs/Self Care Home Management;Aquatic Therapy;Cryotherapy;Electrical Stimulation;Therapeutic exercise;Patient/family education;Neuromuscular re-education;Manual techniques;Scar mobilization;Passive range of motion;Functional mobility training    PT Next Visit Plan  Progress L shoulder AROM (follow MD protocol)    PT Home Exercise Plan  See handouts       Patient will benefit from skilled therapeutic  intervention in order to improve the following deficits and impairments:  Pain, Decreased mobility, Decreased  scar mobility, Hypomobility, Decreased strength, Decreased range of motion, Decreased endurance, Decreased activity tolerance, Impaired flexibility  Visit Diagnosis: Shoulder joint stiffness, left  Acute pain of left shoulder  Muscle weakness (generalized)     Problem List Patient Active Problem List   Diagnosis Date Noted  . Generalized abdominal pain 06/19/2018  . Nausea and vomiting 06/19/2018  . Loss of weight 06/19/2018  . Headache 12/18/2012  . Hypothermia 12/18/2012   Lynnea MaizesJason D Timarie Labell PT, DPT, GCS  Alayzia Pavlock 03/24/2019, 11:30 AM  [place] [facility] [address] [place], [place], [address] Phone: [phone]   Fax:  [phone]  Name: Delorise JacksonChloe A Dunstan MRN: [id number] Date of Birth: 06/18/1996

## 2019-03-26 ENCOUNTER — Ambulatory Visit: Payer: BLUE CROSS/BLUE SHIELD

## 2019-03-26 ENCOUNTER — Encounter: Payer: Self-pay | Admitting: Physical Therapy

## 2019-03-26 ENCOUNTER — Other Ambulatory Visit: Payer: Self-pay

## 2019-03-26 DIAGNOSIS — M25512 Pain in left shoulder: Secondary | ICD-10-CM

## 2019-03-26 DIAGNOSIS — M25612 Stiffness of left shoulder, not elsewhere classified: Secondary | ICD-10-CM

## 2019-03-26 NOTE — Therapy (Signed)
[facility] [address] [place], [place], [address] Phone: [phone]   Fax:  [phone]  Physical Therapy Treatment  Patient Details  Name: Sara GullyChloe A Gomez MRN: [id number] Date of Birth: 08/29/1996 Referring Provider (PT): Dr. Roney Mansreighton   Encounter Date: 03/26/2019  PT End of Session - 03/26/19 0826    Visit Number  5    Number of Visits  24    Date for PT Re-Evaluation  04/16/19    PT Start Time  0800    PT Stop Time  0845    PT Time Calculation (min)  45 min    Activity Tolerance  Patient tolerated treatment well;Patient limited by pain    Behavior During Therapy  [facility] assessed/performed       Past Medical History:  Diagnosis Date  . Abdominal pain 05/23/2018  . Nausea and vomiting 05/23/2018   Per NM Hepato w Eject Fract order    Past Surgical History:  Procedure Laterality Date  . COLONOSCOPY  06/07/2018   Dr Norma Fredricksonoledo  . SHOULDER ARTHROSCOPY     x2  . SHOULDER LATERJET     x3  . TONSILLECTOMY  2005  . UPPER GI ENDOSCOPY  06/07/2018   Dr Norma Fredricksonoledo  . WISDOM TOOTH EXTRACTION      There were no vitals filed for this visit.  Subjective Assessment - 03/26/19 0826    Subjective  Pt reports that after the last session her R shoulder was very sore. However her L shoulder felt fine. Her R shoulder soreness has improved. She currently rates her L shoulder soreness as 2/10 and R shoulder as 4/10. No specific questions or concerns at this time.     Pertinent History  See previous PMHx.  Pt. known well to PT clinic.      Limitations  Lifting;Standing;Writing;House hold activities    Patient Stated Goals  Increase R and L shoulder ROM/ strength to improve pain-free mobility.      Currently in Pain?  Yes    Pain Score  2     Pain Location  Shoulder    Pain Orientation  Left    Pain Descriptors / Indicators  Aching    Pain Type  Chronic pain    Pain Onset  More than a month ago    Pain Frequency  Constant    Multiple Pain Sites  Yes    Pain Score  4    Pain Location  Shoulder    Pain Orientation  Right    Pain Descriptors / Indicators  Aching    Pain Type  Chronic pain    Pain Onset  More than a month ago    Pain Frequency  Intermittent            TREATMENT    Ther-ex  AAROM canes for L shoulder flexion and ER x 20 each direction; Supine L shoulder rhythmic stabs. 30s x 2;(all planes).  Supine chest press with wand and gentle resistance from therapist 2 x 10; Supine L serratus punch with gentle therapist resistance 2 x 10;   Manual Therapy  Supine L shoulder AA/PROM (all planes)- per MD protocol. Supine L shoulder gentle AP Grade II-III mobs. (as tolerated)- 5x20 sec; Supine L shoulder superior to inferior mobs, grade II-III, 30s/bout x 3 bouts; Supine L shoulder posterior/inferior mobs at available end range flexion, grade II-III, 30s/bout x 3 bouts; STM to L UT/deltoid/ biceps/ triceps;   Pt educated throughout session about  proper posture and technique with exercises. Improved exercise technique, movement at target joints, use of target muscles after min to mod verbal, visual, tactile cues.    Therapy session continued to focus on L shoulder ROM within pain-tolerable limits as she continues to demonstrate poor shoulder mobility. She is slightly less guarded today. No pain reported with PROM or mobilizations. Pt heavily encouraged to continue her HEP and follow-up as scheduled. Pt will benefit from PT services to address deficits in strength and ROMy in order to return to full function at home.                            PT Long Term Goals - 02/24/19 1137      PT LONG TERM GOAL #1   Title  Pt. I with HEP to increase L shoulder AROM to [facility] as compared to R shoulder to improve pain-free mobility.      Baseline  Supine R shoulder AROM WFL. Supine L shoulder AAROM: flexion (98 deg.), abduction (60 deg.), ER (4 deg.), IR (70 deg.).     Time  12     Period  Weeks    Status  New    Target Date  04/16/19      PT LONG TERM GOAL #2   Title  Pt. will increase L deltoid/ biceps/ triceps strength to grossly 4/5 MMT to improve return to household tasks/ running.     Baseline  L shoulder strength grossly 3+/5 MMT, biceps/triceps 4/5 MMT.     Time  12    Period  Weeks    Status  New    Target Date  04/16/19      PT LONG TERM GOAL #3   Title  Pt. will increase FOTO to 68 to improve R shoulder mobility.      Baseline  FOTO: baseling 46/ goal 68    Time  12    Period  Weeks    Status  New    Target Date  04/16/19      PT LONG TERM GOAL #4   Title  Pt. able to complete all work related tasks with no shoulder limitations or pain to promote return to PLOF.      Baseline  Pt. completes work related tasks with limitations/ increase in pain.     Time  12    Period  Weeks    Status  New    Target Date  04/16/19            Plan - 03/26/19 0826    Clinical Impression Statement  Therapy session continued to focus on L shoulder ROM within pain-tolerable limits as she continues to demonstrate poor shoulder mobility. She is slightly less guarded today. No pain reported with PROM or mobilizations. Pt heavily encouraged to continue her HEP and follow-up as scheduled. Pt will benefit from PT services to address deficits in strength and ROMy in order to return to full function at home.     Personal Factors and Comorbidities  Age;Fitness    Stability/Clinical Decision Making  Stable/Uncomplicated    Rehab Potential  Good    PT Frequency  2x / week    PT Duration  12 weeks    PT Treatment/Interventions  ADLs/Self Care Home Management;Aquatic Therapy;Cryotherapy;Electrical Stimulation;Therapeutic exercise;Patient/family education;Neuromuscular re-education;Manual techniques;Scar mobilization;Passive range of motion;Functional mobility training    PT Next Visit Plan  Progress L shoulder AROM (follow MD protocol)    PT  Home Exercise Plan  See handouts        Patient will benefit from skilled therapeutic intervention in order to improve the following deficits and impairments:  Pain, Decreased mobility, Decreased scar mobility, Hypomobility, Decreased strength, Decreased range of motion, Decreased endurance, Decreased activity tolerance, Impaired flexibility  Visit Diagnosis: Shoulder joint stiffness, left  Acute pain of left shoulder     Problem List Patient Active Problem List   Diagnosis Date Noted  . Generalized abdominal pain 06/19/2018  . Nausea and vomiting 06/19/2018  . Loss of weight 06/19/2018  . Headache 12/18/2012  . Hypothermia 12/18/2012   Lynnea Maizes PT, DPT, GCS  Huprich,Jason 03/26/2019, 11:03 AM  [place] [facility] [address] [place], [place], [address] Phone: [phone]   Fax:  [phone]  Name: Sara Gomez MRN: [id number] Date of Birth: May 17, 1996

## 2019-03-31 ENCOUNTER — Other Ambulatory Visit: Payer: Self-pay

## 2019-03-31 ENCOUNTER — Encounter: Payer: Self-pay | Admitting: Physical Therapy

## 2019-03-31 ENCOUNTER — Ambulatory Visit: Payer: BLUE CROSS/BLUE SHIELD

## 2019-03-31 DIAGNOSIS — M6281 Muscle weakness (generalized): Secondary | ICD-10-CM

## 2019-03-31 DIAGNOSIS — M25512 Pain in left shoulder: Secondary | ICD-10-CM

## 2019-03-31 DIAGNOSIS — M25612 Stiffness of left shoulder, not elsewhere classified: Secondary | ICD-10-CM

## 2019-03-31 NOTE — Therapy (Signed)
[place] [facility] [address] [place], [place], [address] Phone: [phone]   Fax:  [phone]  Physical Therapy Treatment  Patient Details  Name: Sara GullyChloe A Dauria MRN: [id number] Date of Birth: 06/24/1996 Referring Provider (PT): Dr. Roney Mansreighton   Encounter Date: 03/31/2019  PT End of Session - 03/31/19 0843    Visit Number  6    Number of Visits  24    Date for PT Re-Evaluation  04/16/19    PT Start Time  0840    PT Stop Time  0925    PT Time Calculation (min)  45 min    Activity Tolerance  Patient tolerated treatment well;Patient limited by pain    Behavior During Therapy  [facility] for [facility] assessed/performed       Past Medical History:  Diagnosis Date  . Abdominal pain 05/23/2018  . Nausea and vomiting 05/23/2018   Per NM Hepato w Eject Fract order    Past Surgical History:  Procedure Laterality Date  . COLONOSCOPY  06/07/2018   Dr Norma Fredricksonoledo  . SHOULDER ARTHROSCOPY     x2  . SHOULDER LATERJET     x3  . TONSILLECTOMY  2005  . UPPER GI ENDOSCOPY  06/07/2018   Dr Norma Fredricksonoledo  . WISDOM TOOTH EXTRACTION      There were no vitals filed for this visit.  Subjective Assessment - 03/31/19 0842    Subjective  Pt reports that after the last session her R shoulder was very sore. However her L shoulder felt fine. Her R shoulder soreness has improved. She currently rates her L shoulder soreness as 2/10 and R shoulder as 4/10. No specific questions or concerns at this time.     Pertinent History  See previous PMHx.  Pt. known well to PT clinic.      Limitations  Lifting;Standing;Writing;House hold activities    Patient Stated Goals  Increase R and L shoulder ROM/ strength to improve pain-free mobility.      Currently in Pain?  Yes    Pain Score  3     Pain Location  Shoulder    Pain Orientation  Left    Pain Descriptors / Indicators  Aching    Pain Type  Chronic pain    Pain Onset  More than a month ago    Pain Frequency  Constant    Multiple Pain Sites  No    Pain Onset  --            TREATMENT   Ther-ex AAROM canes for L shoulder flexion and ER x 20 each direction; SupineLshoulder rhythmic stabs. 30s x 2;(all planes).  Supine chest press withwand and gentle resistance from therapist 2 x 10; Supine L serratus punch with gentle therapist resistance 2 x 10; Supine L shoulder AROM flexion x 20; R sidelying L shoulder AROM abduction x 20; R sidelying L shoulder ER x 20; R sidelying L shoulder horizontal abduction x 20; Seated cane rows with therapist providing resistance x 20;   Manual Therapy Supine L shoulder AA/PROM (all planes)- per MD protocol. Supine L shoulder gentle AP Grade I-II mobs. (as tolerated)- 5x20 sec; Supine L shoulder superior to inferior mobs at 90 abduction, grade II-III, 30s/bout x 3 bouts; Supine L shoulder posterior/inferior mobs at available end range flexion, grade II-III, 30s/bout x 3 bouts; STM to L UT/deltoid/ biceps/ triceps;   Pt educated throughout session about proper posture and technique with exercises. Improved exercise technique, movement at target joints,  use of target muscles after min to mod verbal, visual, tactile cues.   Pt demonstrates L shoulder guarding initially during session which improve throughout session. Mild pain initially with mobilizations which resolves shortly thereafter.  Continued slow progression of strengthening due to highly irritable pain and lack of therapy since surgery. Pt is also still on lifting restrictions. Pt heavily encouraged to continue her HEP and follow-up as scheduled.Pt will benefit from PT services to address deficits in strengthand ROMy in order to return to full function at home.                          PT Long Term Goals - 02/24/19 1137      PT LONG TERM GOAL #1   Title  Pt. I with HEP to increase L shoulder AROM to [facility] as compared to R shoulder to improve pain-free mobility.       Baseline  Supine R shoulder AROM WFL. Supine L shoulder AAROM: flexion (98 deg.), abduction (60 deg.), ER (4 deg.), IR (70 deg.).     Time  12    Period  Weeks    Status  New    Target Date  04/16/19      PT LONG TERM GOAL #2   Title  Pt. will increase L deltoid/ biceps/ triceps strength to grossly 4/5 MMT to improve return to household tasks/ running.     Baseline  L shoulder strength grossly 3+/5 MMT, biceps/triceps 4/5 MMT.     Time  12    Period  Weeks    Status  New    Target Date  04/16/19      PT LONG TERM GOAL #3   Title  Pt. will increase FOTO to 68 to improve R shoulder mobility.      Baseline  FOTO: baseling 46/ goal 68    Time  12    Period  Weeks    Status  New    Target Date  04/16/19      PT LONG TERM GOAL #4   Title  Pt. able to complete all work related tasks with no shoulder limitations or pain to promote return to PLOF.      Baseline  Pt. completes work related tasks with limitations/ increase in pain.     Time  12    Period  Weeks    Status  New    Target Date  04/16/19            Plan - 03/31/19 0843    Clinical Impression Statement  Pt demonstrates L shoulder guarding initially during session which improve throughout session. Mild pain initially with mobilizations which resolves shortly thereafter.  Continued slow progression of strengthening due to highly irritable pain and lack of therapy since surgery. Pt is also still on lifting restrictions. Pt heavily encouraged to continue her HEP and follow-up as scheduled.Pt will benefit from PT services to address deficits in strengthand ROMy in order to return to full function at home.    Personal Factors and Comorbidities  Age;Fitness    Stability/Clinical Decision Making  Stable/Uncomplicated    Rehab Potential  Good    PT Frequency  2x / week    PT Duration  12 weeks    PT Treatment/Interventions  ADLs/Self Care Home Management;Aquatic Therapy;Cryotherapy;Electrical Stimulation;Therapeutic  exercise;Patient/family education;Neuromuscular re-education;Manual techniques;Scar mobilization;Passive range of motion;Functional mobility training    PT Next Visit Plan  Progress L shoulder AROM (follow MD protocol)  PT Home Exercise Plan  See handouts       Patient will benefit from skilled therapeutic intervention in order to improve the following deficits and impairments:  Pain, Decreased mobility, Decreased scar mobility, Hypomobility, Decreased strength, Decreased range of motion, Decreased endurance, Decreased activity tolerance, Impaired flexibility  Visit Diagnosis: Shoulder joint stiffness, left  Acute pain of left shoulder  Muscle weakness (generalized)     Problem List Patient Active Problem List   Diagnosis Date Noted  . Generalized abdominal pain 06/19/2018  . Nausea and vomiting 06/19/2018  . Loss of weight 06/19/2018  . Headache 12/18/2012  . Hypothermia 12/18/2012   Lynnea Maizes PT, DPT, GCS  Hutson Luft 03/31/2019, 9:29 AM  [place] [facility] [address] [place], [place], [address] Phone: [phone]   Fax:  [phone]  Name: Estie Hegde Swayne MRN: [id number] Date of Birth: 1996-09-04

## 2019-04-02 ENCOUNTER — Encounter: Payer: Self-pay | Admitting: Physical Therapy

## 2019-04-02 ENCOUNTER — Other Ambulatory Visit: Payer: Self-pay

## 2019-04-02 ENCOUNTER — Ambulatory Visit: Payer: BLUE CROSS/BLUE SHIELD

## 2019-04-02 DIAGNOSIS — M25612 Stiffness of left shoulder, not elsewhere classified: Secondary | ICD-10-CM | POA: Diagnosis not present

## 2019-04-02 DIAGNOSIS — M25512 Pain in left shoulder: Secondary | ICD-10-CM

## 2019-04-02 DIAGNOSIS — M6281 Muscle weakness (generalized): Secondary | ICD-10-CM

## 2019-04-02 NOTE — Therapy (Signed)
[facility] [address] [place], [place], [address] Phone: [phone]   Fax:  [phone]  Physical Therapy Treatment  Patient Details  Name: Leonor Darnell Wist MRN: [id number] Date of Birth: October 04, 1996 Referring Provider (PT): Dr. Roney Mans   Encounter Date: 04/02/2019  PT End of Session - 04/02/19 0901    Visit Number  7    Number of Visits  24    Date for PT Re-Evaluation  04/16/19    PT Start Time  0800    PT Stop Time  0850    PT Time Calculation (min)  50 min    Activity Tolerance  Patient tolerated treatment well;Patient limited by pain    Behavior During Therapy  [facility] for tasks assessed/performed       Past Medical History:  Diagnosis Date  . Abdominal pain 05/23/2018  . Nausea and vomiting 05/23/2018   Per [facility] Hepato w Eject Fract order    Past Surgical History:  Procedure Laterality Date  . COLONOSCOPY  06/07/2018   Dr Norma Fredrickson  . SHOULDER ARTHROSCOPY     x2  . SHOULDER LATERJET     x3  . TONSILLECTOMY  2005  . UPPER GI ENDOSCOPY  06/07/2018   Dr Norma Fredrickson  . WISDOM TOOTH EXTRACTION      There were no vitals filed for this visit.  Subjective Assessment - 04/02/19 0900    Subjective  Pt reports that her shoulder has been more sore lately with the rain. She rates her shoulder pain as a 4/10 upon arrival. No specific questions or concerns currently.     Pertinent History  See previous PMHx.  Pt. known well to PT clinic.      Limitations  Lifting;Standing;Writing;House hold activities    Patient Stated Goals  Increase R and L shoulder ROM/ strength to improve pain-free mobility.      Currently in Pain?  Yes    Pain Score  4     Pain Location  Shoulder    Pain Orientation  Left    Pain Descriptors / Indicators  Aching    Pain Type  Chronic pain    Pain Onset  More than a month ago    Pain Frequency  Constant         TREATMENT   Ther-ex AAROM canes for L shoulder flexion and ER x 20 each  direction; SupineLshoulder rhythmic stabs.30s x 2;(all planes).  Supine chest press withwand and gentle resistance from therapist 2 x 10; SupineLserratus punch with gentle therapist resistance 2 x 10; Supine L shoulder AROM flexion with 1# dumbbell (DB) 2 x 15; R sidelying L shoulder AROM abduction with 1# DB 2 x 15; R sidelying L shoulder ER with 1# DB 2 x 15; R sidelying L shoulder horizontal abduction 2 x 15;   Manual Therapy Supine L shoulder AA/PROM (all planes)- per MD protocol. Supine L shoulder gentle AP Grade I-II mobs. (as tolerated)- 5x20 sec; Supine L shoulder superior to inferior mobs at 90 abduction, grade II-III, 30s/bout x 3 bouts; Supine L shoulderposterior/inferior mobsat availableend range flexion, grade II-III, 30s/bout x 3 bouts; STM to L UT/deltoid/ biceps/ triceps;   Pt educated throughout session about proper posture and technique with exercises. Improved exercise technique, movement at target joints, use of target muscles after min to mod verbal, visual, tactile cues.   Pt demonstrates slightly more L  shoulder guarding today. Mild pain intermittently throughout session but no sharp increases in pain. Continued slow  progression of strengthening due to highly irritable pain and lack of therapy since surgery. Pt is also still on lifting restrictions. Pt heavily encouraged to continue her HEP and follow-up as scheduled.Pt will benefit from PT services to address deficits in strengthand ROMy in order to return to full function at home.                           PT Long Term Goals - 02/24/19 1137      PT LONG TERM GOAL #1   Title  Pt. I with HEP to increase L shoulder AROM to [facility] as compared to R shoulder to improve pain-free mobility.      Baseline  Supine R shoulder AROM WFL. Supine L shoulder AAROM: flexion (98 deg.), abduction (60 deg.), ER (4 deg.), IR (70 deg.).     Time  12    Period  Weeks    Status  New    Target  Date  04/16/19      PT LONG TERM GOAL #2   Title  Pt. will increase L deltoid/ biceps/ triceps strength to grossly 4/5 MMT to improve return to household tasks/ running.     Baseline  L shoulder strength grossly 3+/5 MMT, biceps/triceps 4/5 MMT.     Time  12    Period  Weeks    Status  New    Target Date  04/16/19      PT LONG TERM GOAL #3   Title  Pt. will increase FOTO to 68 to improve R shoulder mobility.      Baseline  FOTO: baseling 46/ goal 68    Time  12    Period  Weeks    Status  New    Target Date  04/16/19      PT LONG TERM GOAL #4   Title  Pt. able to complete all work related tasks with no shoulder limitations or pain to promote return to PLOF.      Baseline  Pt. completes work related tasks with limitations/ increase in pain.     Time  12    Period  Weeks    Status  New    Target Date  04/16/19            Plan - 04/02/19 0901    Clinical Impression Statement  Pt demonstrates slightly more L  shoulder guarding today. Mild pain intermittently throughout session but no sharp increases in pain. Continued slow progression of strengthening due to highly irritable pain and lack of therapy since surgery. Pt is also still on lifting restrictions. Pt heavily encouraged to continue her HEP and follow-up as scheduled.Pt will benefit from PT services to address deficits in strengthand ROMy in order to return to full function at home.    Personal Factors and Comorbidities  Age;Fitness    Stability/Clinical Decision Making  Stable/Uncomplicated    Rehab Potential  Good    PT Frequency  2x / week    PT Duration  12 weeks    PT Treatment/Interventions  ADLs/Self Care Home Management;Aquatic Therapy;Cryotherapy;Electrical Stimulation;Therapeutic exercise;Patient/family education;Neuromuscular re-education;Manual techniques;Scar mobilization;Passive range of motion;Functional mobility training    PT Next Visit Plan  Progress L shoulder AROM (follow MD protocol)    PT Home  Exercise Plan  See handouts       Patient will benefit from skilled therapeutic intervention in order to improve the following deficits and impairments:  Pain, Decreased mobility, Decreased scar mobility,  Hypomobility, Decreased strength, Decreased range of motion, Decreased endurance, Decreased activity tolerance, Impaired flexibility  Visit Diagnosis: Shoulder joint stiffness, left  Acute pain of left shoulder  Muscle weakness (generalized)     Problem List Patient Active Problem List   Diagnosis Date Noted  . Generalized abdominal pain 06/19/2018  . Nausea and vomiting 06/19/2018  . Loss of weight 06/19/2018  . Headache 12/18/2012  . Hypothermia 12/18/2012   Lynnea MaizesJason D Yashika Mask PT, DPT, GCS  Elzie Knisley 04/02/2019, 9:03 AM  [facility] [address] [place], [place], [address] Phone: [phone]   Fax:  [phone]  Name: Ihor GullyChloe A Scally MRN: [id number] Date of Birth: 08/26/1996

## 2019-04-08 ENCOUNTER — Encounter: Payer: BLUE CROSS/BLUE SHIELD | Admitting: Physical Therapy

## 2019-04-10 ENCOUNTER — Ambulatory Visit: Payer: BLUE CROSS/BLUE SHIELD | Admitting: Physical Therapy

## 2019-04-10 ENCOUNTER — Encounter: Payer: Self-pay | Admitting: Physical Therapy

## 2019-04-10 ENCOUNTER — Other Ambulatory Visit: Payer: Self-pay

## 2019-04-10 DIAGNOSIS — M25511 Pain in right shoulder: Secondary | ICD-10-CM

## 2019-04-10 DIAGNOSIS — M25611 Stiffness of right shoulder, not elsewhere classified: Secondary | ICD-10-CM

## 2019-04-10 DIAGNOSIS — M25512 Pain in left shoulder: Secondary | ICD-10-CM

## 2019-04-10 DIAGNOSIS — G8929 Other chronic pain: Secondary | ICD-10-CM

## 2019-04-10 DIAGNOSIS — M25612 Stiffness of left shoulder, not elsewhere classified: Secondary | ICD-10-CM

## 2019-04-10 DIAGNOSIS — M6281 Muscle weakness (generalized): Secondary | ICD-10-CM

## 2019-04-10 NOTE — Therapy (Signed)
[facility] [address] [place], [place], [address] Phone: [phone]   Fax:  [phone]  Physical Therapy Treatment  Patient Details  Name: Sara Gomez MRN: [id number] Date of Birth: 03/21/1996 Referring Provider (PT): Dr. Roney Mans   Encounter Date: 04/10/2019  PT End of Session - 04/10/19 0801    Visit Number  8    Number of Visits  24    Date for PT Re-Evaluation  04/16/19    PT Start Time  0759    PT Stop Time  0846    PT Time Calculation (min)  47 min    Activity Tolerance  Patient tolerated treatment well;Patient limited by pain    Behavior During Therapy  [facility] for tasks assessed/performed       Past Medical History:  Diagnosis Date  . Abdominal pain 05/23/2018  . Nausea and vomiting 05/23/2018   Per NM Hepato w Eject Fract order    Past Surgical History:  Procedure Laterality Date  . COLONOSCOPY  06/07/2018   Dr Norma Fredrickson  . SHOULDER ARTHROSCOPY     x2  . SHOULDER LATERJET     x3  . TONSILLECTOMY  2005  . UPPER GI ENDOSCOPY  06/07/2018   Dr Norma Fredrickson  . WISDOM TOOTH EXTRACTION      There were no vitals filed for this visit.  Subjective Assessment - 04/10/19 0759    Subjective  Patient states that her shoulder has been "okay." Patient refusing to wear mask intitially, but with provider request, dons mask. Patient denies any specific concerns and plays on cellphone throughout subjective.    Pertinent History  See previous PMHx.  Pt. known well to PT clinic.      Limitations  Lifting;Standing;Writing;House hold activities    Patient Stated Goals  Increase R and L shoulder ROM/ strength to improve pain-free mobility.      Currently in Pain?  Yes    Pain Score  2     Pain Location  Shoulder    Pain Orientation  Left    Pain Descriptors / Indicators  Aching    Pain Type  Chronic pain    Pain Onset  More than a month ago       Ther-ex AAROM canes for L shoulder flexion and ER x 20 each  direction; SupineLshoulder rhythmic stabs.30s x 2;(all planes).  Supine chest press withwand and gentle resistance from therapist 2 x 10; SupineLserratus punch with gentle therapist resistance 2 x 10; Supine L shoulder AROM flexion with 1# dumbbell (DB) 2 x 15; R sidelying L shoulder AROM abduction with 1# DB 2 x 15; R sidelying L shoulder ER with 1# DB 2 x 15;   Manual Therapy Supine L shoulder AA/PROM (all planes)- per MD protocol. Supine L shoulder gentle AP Grade I-II mobs. (as tolerated)- 5x20 sec; Supine L shoulder superior to inferior mobs at 90 abduction, grade II-III, 30s/bout x 3 bouts; STM at L shoulder incision and into biceps, x 5 min  Patient educated throughout session on appropriate technique and form using multi-modal cueing, HEP, and activity modification. Patient articulated understanding and returned demonstration.  Patient Response to interventions: Patient reporting no significantly increased pain, but notable fatigue. "My arm feels like jello."  ASSESSMENT Patient presents to clinic with excellent motivation to participate in therapy. Patient demonstrates deficits in L shoulder ROM, L shoulder strength, and L shoulder function as evidenced by <100 degrees of abduction, negligible ER, and increased fatigue with active movements.  Patient able to tolerate increased manual resistance during serratus punches both concentrically and eccentrically during today's session and responded positively to active interventions. Patient will benefit from continued skilled therapeutic intervention to address remaining deficits in  L shoulder ROM, L shoulder strength, and L shoulder function in order to increase function, and improve overall QOL.         PT Long Term Goals - 02/24/19 1137      PT LONG TERM GOAL #1   Title  Pt. I with HEP to increase L shoulder [facility]OM to [facility] as compared to R shoulder to improve pain-free mobility.      Baseline  Supine R shoulder AROM WFL.  Supine L shoulder AAROM: flexion (98 deg.), abduction (60 deg.), ER (4 deg.), IR (70 deg.).     Time  12    Period  Weeks    Status  New    Target Date  04/16/19      PT LONG TERM GOAL #2   Title  Pt. will increase L deltoid/ biceps/ triceps strength to grossly 4/5 MMT to improve return to household tasks/ running.     Baseline  L shoulder strength grossly 3+/5 MMT, biceps/triceps 4/5 MMT.     Time  12    Period  Weeks    Status  New    Target Date  04/16/19      PT LONG TERM GOAL #3   Title  Pt. will increase FOTO to 68 to improve R shoulder mobility.      Baseline  FOTO: baseling 46/ goal 68    Time  12    Period  Weeks    Status  New    Target Date  04/16/19      PT LONG TERM GOAL #4   Title  Pt. able to complete all work related tasks with no shoulder limitations or pain to promote return to PLOF.      Baseline  Pt. completes work related tasks with limitations/ increase in pain.     Time  12    Period  Weeks    Status  New    Target Date  04/16/19            Plan - 04/10/19 0859    Clinical Impression Statement  Patient presents to clinic with excellent motivation to participate in therapy. Patient demonstrates deficits in L shoulder ROM, L shoulder strength, and L shoulder function as evidenced by <100 degrees of abduction, negligible ER, and increased fatigue with active movements. Patient able to tolerate increased manual resistance during serratus punches both concentrically and eccentrically during today's session and responded positively to active interventions. Patient will benefit from continued skilled therapeutic intervention to address remaining deficits in  L shoulder ROM, L shoulder strength, and L shoulder function in order to increase function, and improve overall QOL.    Personal Factors and Comorbidities  Age;Fitness    Stability/Clinical Decision Making  Stable/Uncomplicated    Rehab Potential  Good    PT Frequency  2x / week    PT Duration  12 weeks     PT Treatment/Interventions  ADLs/Self Care Home Management;Aquatic Therapy;Cryotherapy;Electrical Stimulation;Therapeutic exercise;Patient/family education;Neuromuscular re-education;Manual techniques;Scar mobilization;Passive range of motion;Functional mobility training    PT Next Visit Plan  Progress L shoulder AROM (follow MD protocol)    PT Home Exercise Plan  See handouts       Patient will benefit from skilled therapeutic intervention in order to improve the following deficits and  impairments:  Pain, Decreased mobility, Decreased scar mobility, Hypomobility, Decreased strength, Decreased range of motion, Decreased endurance, Decreased activity tolerance, Impaired flexibility  Visit Diagnosis: Shoulder joint stiffness, left  Acute pain of left shoulder  Muscle weakness (generalized)  Acute pain of right shoulder  Shoulder joint stiffness, right  Chronic left shoulder pain     Problem List Patient Active Problem List   Diagnosis Date Noted  . Generalized abdominal pain 06/19/2018  . Nausea and vomiting 06/19/2018  . Loss of weight 06/19/2018  . Headache 12/18/2012  . Hypothermia 12/18/2012   Sheria Lang PT, DPT [phone] 04/10/2019, 5:05 PM   [facility] [address] [place], [place], [address] Phone: [phone]   Fax:  [phone]  Name: Zamyra Allensworth Windish MRN: [id number] Date of Birth: August 29, 1996

## 2019-04-14 ENCOUNTER — Encounter: Payer: Self-pay | Admitting: Physical Therapy

## 2019-04-14 ENCOUNTER — Other Ambulatory Visit: Payer: Self-pay

## 2019-04-14 ENCOUNTER — Ambulatory Visit: Payer: BC Managed Care – PPO | Attending: Orthopedic Surgery

## 2019-04-14 DIAGNOSIS — M25512 Pain in left shoulder: Secondary | ICD-10-CM | POA: Insufficient documentation

## 2019-04-14 DIAGNOSIS — M6281 Muscle weakness (generalized): Secondary | ICD-10-CM | POA: Insufficient documentation

## 2019-04-14 DIAGNOSIS — M25511 Pain in right shoulder: Secondary | ICD-10-CM | POA: Diagnosis present

## 2019-04-14 DIAGNOSIS — M25612 Stiffness of left shoulder, not elsewhere classified: Secondary | ICD-10-CM

## 2019-04-14 DIAGNOSIS — M25611 Stiffness of right shoulder, not elsewhere classified: Secondary | ICD-10-CM | POA: Diagnosis present

## 2019-04-14 DIAGNOSIS — G8929 Other chronic pain: Secondary | ICD-10-CM | POA: Diagnosis present

## 2019-04-14 NOTE — Therapy (Signed)
[facility] [address] [place], [place], [address] Phone: [phone]   Fax:  [phone]  Physical Therapy Treatment  Patient Details  Name: Sara Gomez MRN: [id number] Date of Birth: 01/21/1996 Referring Provider (PT): Dr. Roney Mansreighton   Encounter Date: 04/14/2019  PT End of Session - 04/14/19 0807    Visit Number  9    Number of Visits  24    Date for PT Re-Evaluation  04/16/19    PT Start Time  0735    PT Stop Time  0818    PT Time Calculation (min)  43 min    Activity Tolerance  Patient tolerated treatment well;Patient limited by pain    Behavior During Therapy  [facility] for tasks assessed/performed       Past Medical History:  Diagnosis Date  . Abdominal pain 05/23/2018  . Nausea and vomiting 05/23/2018   Per NM Hepato w Eject Fract order    Past Surgical History:  Procedure Laterality Date  . COLONOSCOPY  06/07/2018   Dr Norma Fredricksonoledo  . SHOULDER ARTHROSCOPY     x2  . SHOULDER LATERJET     x3  . TONSILLECTOMY  2005  . UPPER GI ENDOSCOPY  06/07/2018   Dr Norma Fredricksonoledo  . WISDOM TOOTH EXTRACTION      There were no vitals filed for this visit.  Subjective Assessment - 04/14/19 0736    Subjective  Patient reported some pain in her L shoulder this AM. about 3.5. Stated most days it hurts though some days it doesn't    Pertinent History  See previous PMHx.  Pt. known well to PT clinic.      Limitations  Lifting;Standing;Writing;House hold activities    Patient Stated Goals  Increase R and L shoulder ROM/ strength to improve pain-free mobility.      Currently in Pain?  Yes    Pain Score  3     Pain Location  Shoulder    Pain Orientation  Left       TREATMENT Ther-ex  AAROM cane for L shoulder flexion and ER x 20 each direction; Supine L shoulder rhythmic stabs med/lat and superior/inferior with cues for scapular retraction. 30s x 2 Supine chest press with wand and gentle resistance from therapist 2 x 10; Supine L  serratus punch with gentle therapist resistance 2 x 10; Supine L shoulder AA/PROM (all plans -per MD protocol   Manual Therapy  Prone STM to ERs (suprapinatus, infraspinatus), UT, levator, anterior deltoid Grades I-II AP glides of L shoulder 5x20sec Grade I-IIV superior to inferior glides of L shoulder 5 x20sec  Patient educated throughout session on appropriate technique and form using multi-modal cueing, HEP, and activity modification. Patient articulated understanding and returned demonstration.  Pt response to treatment: Pt reported decreased tension in LUE with AROM post STM. Pt exhibited fatigue at end of session, reported decreased pain.  Clinical impression: Pt continued to demonstrate limitations in L shoulder ER as well as pain with AROM of L shoulder >90degs. Pt with excellent motivation to address shoulder limitations. The patient would benefit from further skilled PT to continue to progress towards goals per protocol and maximize functional abilities.     PT Long Term Goals - 02/24/19 1137      PT LONG TERM GOAL #1   Title  Pt. I with HEP to increase L shoulder AROM to [facility] as compared to R shoulder to improve pain-free mobility.      Baseline  Supine  R shoulder AROM WFL. Supine L shoulder AAROM: flexion (98 deg.), abduction (60 deg.), ER (4 deg.), IR (70 deg.).     Time  12    Period  Weeks    Status  New    Target Date  04/16/19      PT LONG TERM GOAL #2   Title  Pt. will increase L deltoid/ biceps/ triceps strength to grossly 4/5 MMT to improve return to household tasks/ running.     Baseline  L shoulder strength grossly 3+/5 MMT, biceps/triceps 4/5 MMT.     Time  12    Period  Weeks    Status  New    Target Date  04/16/19      PT LONG TERM GOAL #3   Title  Pt. will increase FOTO to 68 to improve R shoulder mobility.      Baseline  FOTO: baseling 46/ goal 68    Time  12    Period  Weeks    Status  New    Target Date  04/16/19      PT LONG TERM GOAL #4    Title  Pt. able to complete all work related tasks with no shoulder limitations or pain to promote return to PLOF.      Baseline  Pt. completes work related tasks with limitations/ increase in pain.     Time  12    Period  Weeks    Status  New    Target Date  04/16/19            Plan - 04/14/19 0926    Clinical Impression Statement  Pt reported decreased tension in LUE with AROM post STM. Pt exhibited fatigue at end of session, reported decreased pain. Pt continued to demonstrate limitations in L shoulder ER as well as pain with AROM of L shoulder >90degs. Pt with excellent motivation to address shoulder limitations. The patient would benefit from further skilled PT to continue to progress towards goals per protocol and maximize functional abilities.    Personal Factors and Comorbidities  Age;Fitness    Stability/Clinical Decision Making  Stable/Uncomplicated    Rehab Potential  Good    PT Frequency  2x / week    PT Duration  12 weeks    PT Treatment/Interventions  ADLs/Self Care Home Management;Aquatic Therapy;Cryotherapy;Electrical Stimulation;Therapeutic exercise;Patient/family education;Neuromuscular re-education;Manual techniques;Scar mobilization;Passive range of motion;Functional mobility training    PT Next Visit Plan  Progress L shoulder AROM (follow MD protocol)    PT Home Exercise Plan  See handouts       Patient will benefit from skilled therapeutic intervention in order to improve the following deficits and impairments:  Pain, Decreased mobility, Decreased scar mobility, Hypomobility, Decreased strength, Decreased range of motion, Decreased endurance, Decreased activity tolerance, Impaired flexibility  Visit Diagnosis: Shoulder joint stiffness, left  Shoulder joint stiffness, right  Acute pain of left shoulder  Chronic left shoulder pain  Muscle weakness (generalized)  Acute pain of right shoulder     Problem List Patient Active Problem List   Diagnosis  Date Noted  . Generalized abdominal pain 06/19/2018  . Nausea and vomiting 06/19/2018  . Loss of weight 06/19/2018  . Headache 12/18/2012  . Hypothermia 12/18/2012    Olga Coaster PT, DPT 9:27 AM,04/14/19 [phone]  [facility] [address] [place], [place], [address] Phone: [phone]   Fax:  [phone]  Name: Sara Gomez MRN: [id number] Date of Birth: 24-Sep-1996

## 2019-04-16 ENCOUNTER — Encounter: Payer: Self-pay | Admitting: Physical Therapy

## 2019-04-16 ENCOUNTER — Other Ambulatory Visit: Payer: Self-pay

## 2019-04-16 ENCOUNTER — Ambulatory Visit: Payer: BC Managed Care – PPO | Admitting: Physical Therapy

## 2019-04-16 DIAGNOSIS — M25612 Stiffness of left shoulder, not elsewhere classified: Secondary | ICD-10-CM

## 2019-04-16 DIAGNOSIS — M6281 Muscle weakness (generalized): Secondary | ICD-10-CM

## 2019-04-16 DIAGNOSIS — M25512 Pain in left shoulder: Secondary | ICD-10-CM

## 2019-04-16 DIAGNOSIS — G8929 Other chronic pain: Secondary | ICD-10-CM

## 2019-04-16 DIAGNOSIS — M25611 Stiffness of right shoulder, not elsewhere classified: Secondary | ICD-10-CM

## 2019-04-16 NOTE — Therapy (Signed)
[facility] [address]. [place], [place], [address] Phone: [phone]   Fax:  [phone]  Physical Therapy Treatment  Patient Details  Name: Sara Gomez MRN: [id number] Date of Birth: Apr 24, 1996 Referring Provider (PT): Dr. Roney Mans   Encounter Date: 04/16/2019  PT End of Session - 04/16/19 0834    Visit Number  10    Number of Visits  24    Date for PT Re-Evaluation  04/16/19    PT Start Time  0727    PT Stop Time  0828    PT Time Calculation (min)  61 min    Activity Tolerance  Patient tolerated treatment well;Patient limited by pain    Behavior During Therapy  [facility] for tasks assessed/performed       Past Medical History:  Diagnosis Date  . Abdominal pain 05/23/2018  . Nausea and vomiting 05/23/2018   Per NM Hepato w Eject Fract order    Past Surgical History:  Procedure Laterality Date  . COLONOSCOPY  06/07/2018   Dr Norma Fredrickson  . SHOULDER ARTHROSCOPY     x2  . SHOULDER LATERJET     x3  . TONSILLECTOMY  2005  . UPPER GI ENDOSCOPY  06/07/2018   Dr Norma Fredrickson  . WISDOM TOOTH EXTRACTION      There were no vitals filed for this visit.  Subjective Assessment - 04/16/19 0732    Subjective  L shoulder pain 4/10 currently at rest.  Pt. demonstrates limited L shoulder abduction/ ER.  Pt. continues to report difficulty with sidelying position secondary pain on L/R sides.      Pertinent History  See previous PMHx.  Pt. known well to PT clinic.      Limitations  Lifting;Standing;Writing;House hold activities    Patient Stated Goals  Increase R and L shoulder ROM/ strength to improve pain-free mobility.      Currently in Pain?  Yes    Pain Score  4     Pain Location  Shoulder    Pain Orientation  Left    Pain Descriptors / Indicators  Aching    Pain Type  Chronic pain         TREATMENT    Ther-ex  Supine L shoulder rhythmic stabs. (all planes).  Focus on IR/ER (minimal resistance) Supine chest press with wand  and gentle resistance from therapist 2 x 10; Supine R serratus punch with gentle therapist resistance 2 x 10; Nautilus: 20# lat. Pull downs/ 20# L sh. Adduction/ 20# B scap. Retraction/ 10# L shoulder IR (PT assist for proper technique)- 20x each.  Poor muscle endurance/ fatigue noted.     Manual Therapy  Supine L shoulder AA/PROM (all planes)- per MD protocol. Supine L shoulder gentle AP Grade II-III mobs. (as tolerated)- 5x20 sec; STM to L UT/deltoid/ biceps 8 min.  Bilateral UT/ levator stretches 3x with holds.  Several trigger point noted.  Trigger poin release technique  Discussed sleeping position   Pt educated throughout session about proper posture and technique with exercises. Improved exercise technique, movement at target joints, use of target muscles after min to mod verbal, visual, tactile cues.     PT Long Term Goals - 02/24/19 1137      PT LONG TERM GOAL #1   Title  Pt. I with HEP to increase L shoulder AROM to [facility] as compared to R shoulder to improve pain-free mobility.      Baseline  Supine R shoulder AROM WFL. Supine L  shoulder AAROM: flexion (98 deg.), abduction (60 deg.), ER (4 deg.), IR (70 deg.).     Time  12    Period  Weeks    Status  New    Target Date  04/16/19      PT LONG TERM GOAL #2   Title  Pt. will increase L deltoid/ biceps/ triceps strength to grossly 4/5 MMT to improve return to household tasks/ running.     Baseline  L shoulder strength grossly 3+/5 MMT, biceps/triceps 4/5 MMT.     Time  12    Period  Weeks    Status  New    Target Date  04/16/19      PT LONG TERM GOAL #3   Title  Pt. will increase FOTO to 68 to improve R shoulder mobility.      Baseline  FOTO: baseling 46/ goal 68    Time  12    Period  Weeks    Status  New    Target Date  04/16/19      PT LONG TERM GOAL #4   Title  Pt. able to complete all work related tasks with no shoulder limitations or pain to promote return to PLOF.      Baseline  Pt. completes work related  tasks with limitations/ increase in pain.     Time  12    Period  Weeks    Status  New    Target Date  04/16/19            Plan - 04/16/19 0835    Clinical Impression Statement  Pt. presents with limited L shoulder abduction (78 deg. pain reported), ER (32 deg. pain reported).  L grip strength grossly 49.6# with no increase c/o pain.  Pt. presents with generalized B shoulder/UE muscle weakness and fatigue with isometric/ resisted ther.ex.      Personal Factors and Comorbidities  Age;Fitness    Stability/Clinical Decision Making  Stable/Uncomplicated    Clinical Decision Making  Low    Rehab Potential  Good    PT Frequency  2x / week    PT Duration  12 weeks    PT Treatment/Interventions  ADLs/Self Care Home Management;Aquatic Therapy;Cryotherapy;Electrical Stimulation;Therapeutic exercise;Patient/family education;Neuromuscular re-education;Manual techniques;Scar mobilization;Passive range of motion;Functional mobility training    PT Next Visit Plan  Progress L shoulder AROM (follow MD protocol).   CHECK GOALS/ RECERT NEXT TX SESSION    PT Home Exercise Plan  See handouts       Patient will benefit from skilled therapeutic intervention in order to improve the following deficits and impairments:  Pain, Decreased mobility, Decreased scar mobility, Hypomobility, Decreased strength, Decreased range of motion, Decreased endurance, Decreased activity tolerance, Impaired flexibility  Visit Diagnosis: Shoulder joint stiffness, left  Shoulder joint stiffness, right  Acute pain of left shoulder  Chronic left shoulder pain  Muscle weakness (generalized)     Problem List Patient Active Problem List   Diagnosis Date Noted  . Generalized abdominal pain 06/19/2018  . Nausea and vomiting 06/19/2018  . Loss of weight 06/19/2018  . Headache 12/18/2012  . Hypothermia 12/18/2012   Cammie McgeeMichael C Sherk, PT, DPT # [phone] 04/16/2019, 8:49 AM  [facility] [address] [place], [place], [address] Phone: [phone]   Fax:  [phone]  Name: Sara Gomez MRN: [id number] Date of Birth: 08/22/1996

## 2019-04-21 ENCOUNTER — Encounter: Payer: Self-pay | Admitting: Physical Therapy

## 2019-04-21 ENCOUNTER — Other Ambulatory Visit: Payer: Self-pay

## 2019-04-21 ENCOUNTER — Ambulatory Visit: Payer: BC Managed Care – PPO | Admitting: Physical Therapy

## 2019-04-21 DIAGNOSIS — M25611 Stiffness of right shoulder, not elsewhere classified: Secondary | ICD-10-CM

## 2019-04-21 DIAGNOSIS — M25512 Pain in left shoulder: Secondary | ICD-10-CM

## 2019-04-21 DIAGNOSIS — M25612 Stiffness of left shoulder, not elsewhere classified: Secondary | ICD-10-CM | POA: Diagnosis not present

## 2019-04-21 NOTE — Therapy (Signed)
[facility] [address] [place], [place], [address] Phone: [phone]   Fax:  [phone]  Physical Therapy Treatment  Patient Details  Name: Sara Gomez MRN: [id number] Date of Birth: 1996/02/24 Referring Provider (PT): Dr. Jeannie Fend   Encounter Date: 04/21/2019  PT End of Session - 04/21/19 0715    Visit Number  11    Number of Visits  19    Date for PT Re-Evaluation  05/19/19    PT Start Time  0729    PT Stop Time  0829    PT Time Calculation (min)  60 min    Activity Tolerance  Patient tolerated treatment well;Patient limited by pain    Behavior During Therapy  [facility] for tasks assessed/performed       Past Medical History:  Diagnosis Date  . Abdominal pain 05/23/2018  . Nausea and vomiting 05/23/2018   Per NM Hepato w Eject Fract order    Past Surgical History:  Procedure Laterality Date  . COLONOSCOPY  06/07/2018   Dr Alice Reichert  . SHOULDER ARTHROSCOPY     x2  . SHOULDER LATERJET     x3  . TONSILLECTOMY  2005  . UPPER GI ENDOSCOPY  06/07/2018   Dr Alice Reichert  . WISDOM TOOTH EXTRACTION      There were no vitals filed for this visit.  Subjective Assessment - 04/21/19 0714    Subjective  Pt. reports a lot of L shoulder pain yesterday but 3/10 pain currently.    Pertinent History  See previous PMHx.  Pt. known well to PT clinic.      Limitations  Lifting;Standing;Writing;House hold activities    Patient Stated Goals  Increase R and L shoulder ROM/ strength to improve pain-free mobility.      Currently in Pain?  Yes    Pain Score  3     Pain Location  Shoulder    Pain Orientation  Left    Pain Descriptors / Indicators  Aching    Pain Type  Chronic pain         [facility] PT Assessment - 04/21/19 0001      Assessment   Medical Diagnosis  s/p L [place] procedure/ ICBG, R shoulder pain.      Referring Provider (PT)  Dr. Jeannie Fend    Onset Date/Surgical Date  12/10/18    Hand Dominance  Right    Prior Therapy   yes      Prior Function   Level of Independence  Independent       L shoulder flexion 138 deg./ abduction 128 deg./ IR 72 deg./ ER 32 (pain).      TREATMENT  Ther-ex SupineLshoulder rhythmic stabs. (all planes).  Focus on IR/ER (minimal resistance) Supine chest press withwand and gentle resistance from therapist 2 x 10; Supine R serratus punch with gentle therapist resistance 2 x 10; Nautilus: 30# lat. Pull downs/ 20# tricep extension 20x each.  Poor muscle endurance/ fatigue noted.   See new HEP (handouts issued)   Manual Therapy Supine L shoulder AA/PROM (all planes)- per MD protocol. Supine L shoulder gentle AP Grade II-III mobs. (as tolerated)- 5x20 sec; STM to L UT/deltoid/ biceps 10 min. (supine/prone position)  BilateralUT/ levator stretches 3x with holds.  Several trigger point noted.  Trigger poin release technique    Pt educated throughout session about proper posture and technique with exercises. Improved exercise technique, movement at target joints, use of target muscles after min to mod verbal, visual,  tactile cues.    PT Long Term Goals - 04/21/19 0716      PT LONG TERM GOAL #1   Title  Pt. I with HEP to increase L shoulder AROM to [facility] as compared to R shoulder to improve pain-free mobility.      Baseline  L shoulder AROM in supine position: flexion 138 deg./ abduction 128 deg./ IR 72 deg./ ER 32 (pain).     Time  4    Period  Weeks    Status  Not Met    Target Date  05/19/19      PT LONG TERM GOAL #2   Title  Pt. will increase L deltoid/ biceps/ triceps strength to grossly 4/5 MMT to improve return to household tasks/ running.     Baseline  L shoulder strength grossly 3+/5 MMT, biceps/triceps 4/5 MMT.     Time  4    Period  Weeks    Status  Not Met    Target Date  05/19/19      PT LONG TERM GOAL #3   Title  Pt. will increase FOTO to 68 to improve R shoulder mobility.      Baseline  FOTO: baseline 46/ goal 68    Time  4    Period   Weeks    Status  On-going    Target Date  05/19/19      PT LONG TERM GOAL #4   Title  Pt. able to complete all work related tasks with no shoulder limitations or pain to promote return to PLOF.      Baseline  Pt. has not returned to work at this time due to [employer] restrictions.     Time  4    Period  Weeks    Status  On-going    Target Date  05/19/19          Plan - 04/21/19 0716    Clinical Impression Statement  Pt. continues to demonstrate L shoulder guarding during session due to pain which does improve throughout session. Mild pain initially with mobilizations which resolves shortly thereafter. Continued slow progression of strengthening due to highly irritable pain and poor compliance with HEP.  Pt heavily encouraged to continue her HEP and follow-up as scheduled.  L shoulder AROM in supine position:  flexion 138 deg./ abduction 128 deg./ IR 72 deg./ ER 32 (pain).  L grip strength grossly 49.6# with no increase c/o pain.  Pt. presents with generalized B shoulder/UE muscle weakness and fatigue with isometric/ resisted ther.ex.  Pt will benefit from PT services to address deficits in strength and ROM in order to return to full function at hom    Personal Factors and Comorbidities  Age;Fitness    Stability/Clinical Decision Making  Stable/Uncomplicated    Clinical Decision Making  Low    Rehab Potential  Good    PT Frequency  2x / week    PT Duration  4 weeks    PT Treatment/Interventions  ADLs/Self Care Home Management;Aquatic Therapy;Cryotherapy;Electrical Stimulation;Therapeutic exercise;Patient/family education;Neuromuscular re-education;Manual techniques;Scar mobilization;Passive range of motion;Functional mobility training    PT Next Visit Plan  Progress L shoulder AROM (follow MD protocol).   Strength training/ reassess HEP and compliance    PT Home Exercise Plan  See handouts       Patient will benefit from skilled therapeutic intervention in order to improve the following  deficits and impairments:  Pain, Decreased mobility, Decreased scar mobility, Hypomobility, Decreased strength, Decreased range of motion, Decreased endurance,  Decreased activity tolerance, Impaired flexibility  Visit Diagnosis: Shoulder joint stiffness, left  Shoulder joint stiffness, right  Acute pain of left shoulder     Problem List Patient Active Problem List   Diagnosis Date Noted  . Generalized abdominal pain 06/19/2018  . Nausea and vomiting 06/19/2018  . Loss of weight 06/19/2018  . Headache 12/18/2012  . Hypothermia 12/18/2012   Pura Spice, PT, DPT # [phone] 04/21/2019, 8:42 AM  [place] [facility] [address] [place], [place], [address] Phone: [phone]   Fax:  [phone]  Name: Sara Gomez MRN: [id number] Date of Birth: January 16, 1996

## 2019-04-21 NOTE — Patient Instructions (Signed)
Access Code: [email or web address]  URL: [email or web address]  Date: 04/21/2019  Prepared by: Dorcas Carrow   Exercises  Standing Shoulder Flexion AAROM with Dowel - 20 reps - 1 sets - 3 hold - 2x daily - 7x weekly  Prone Shoulder Extension - 10 reps - 1 sets - 1x daily - 7x weekly  Prone Scapular Protraction Retraction AROM on Forearms - 20 reps - 1 sets - 1x daily - 7x weekly  Prone Single Shoulder Flexion - 20 reps - 1 sets - 1x daily - 7x weekly

## 2019-04-23 ENCOUNTER — Encounter: Payer: BLUE CROSS/BLUE SHIELD | Admitting: Physical Therapy

## 2019-04-25 ENCOUNTER — Encounter: Payer: Self-pay | Admitting: Physical Therapy

## 2019-04-25 ENCOUNTER — Other Ambulatory Visit: Payer: Self-pay

## 2019-04-25 ENCOUNTER — Ambulatory Visit: Payer: BC Managed Care – PPO | Admitting: Physical Therapy

## 2019-04-25 DIAGNOSIS — M6281 Muscle weakness (generalized): Secondary | ICD-10-CM

## 2019-04-25 DIAGNOSIS — M25612 Stiffness of left shoulder, not elsewhere classified: Secondary | ICD-10-CM | POA: Diagnosis not present

## 2019-04-25 DIAGNOSIS — G8929 Other chronic pain: Secondary | ICD-10-CM

## 2019-04-25 DIAGNOSIS — M25512 Pain in left shoulder: Secondary | ICD-10-CM

## 2019-04-25 DIAGNOSIS — M25611 Stiffness of right shoulder, not elsewhere classified: Secondary | ICD-10-CM

## 2019-04-25 NOTE — Therapy (Signed)
[facility] [address] [place], [place], [address] Phone: [phone]   Fax:  [phone]  Physical Therapy Treatment  Patient Details  Name: Sara Gomez MRN: [id number] Date of Birth: [birth year] Referring Provider (PT): Dr. Jeannie Fend   Encounter Date: 04/25/2019  PT End of Session - 04/25/19 0754    Visit Number  12    Number of Visits  19    Date for PT Re-Evaluation  05/19/19    PT Start Time  0744    PT Stop Time  0834    PT Time Calculation (min)  50 min    Activity Tolerance  Patient tolerated treatment well;Patient limited by pain    Behavior During Therapy  [facility] for tasks assessed/performed       Past Medical History:  Diagnosis Date  . Abdominal pain 05/23/2018  . Nausea and vomiting 05/23/2018   Per NM Hepato w Eject Fract order    Past Surgical History:  Procedure Laterality Date  . COLONOSCOPY  06/07/2018   Dr Alice Reichert  . SHOULDER ARTHROSCOPY     x2  . SHOULDER LATERJET     x3  . TONSILLECTOMY  2005  . UPPER GI ENDOSCOPY  06/07/2018   Dr Alice Reichert  . WISDOM TOOTH EXTRACTION      There were no vitals filed for this visit.  Subjective Assessment - 04/25/19 0750    Subjective  Pt. reports no L shoulder pain at this time and had a good time on boat/ jet ski.  Pt. reports poor compliance with shoulder exercises at home.    Pertinent History  See previous PMHx.  Pt. known well to PT clinic.      Limitations  Lifting;Standing;Writing;House hold activities    Patient Stated Goals  Increase R and L shoulder ROM/ strength to improve pain-free mobility.      Currently in Pain?  No/denies    Pain Onset  More than a month ago    Pain Onset  More than a month ago        There.ex.:  Standing B shoulder AROM all planes (reassessment)- guarded/ pain limited.   Nautilus: 20# lat. Pull downs/ tricep extension/ scapular retraction/ B sh. Adduction with handles 30x.   Supine serratus punches (manual feedback/  resistance)- 25x Supine R UE 4# biceps curls/ press-ups 10x2.  Moderate cuing from PT to continue/ increase reps.  Pt. Discouraged by pain.     Manual tx.:   Supine L shoulder AA/PROM (all planes of movement)- gentle oscillations to decrease muscle guarding Supine L AP/PA grade II-III mobs. 3x20 sec. STM to L UT/ deltoid musculature.          PT Long Term Goals - 04/21/19 0716      PT LONG TERM GOAL #1   Title  Pt. I with HEP to increase L shoulder AROM to [facility] as compared to R shoulder to improve pain-free mobility.      Baseline  L shoulder AROM in supine position: flexion 138 deg./ abduction 128 deg./ IR 72 deg./ ER 32 (pain).     Time  4    Period  Weeks    Status  Not Met    Target Date  05/19/19      PT LONG TERM GOAL #2   Title  Pt. will increase L deltoid/ biceps/ triceps strength to grossly 4/5 MMT to improve return to household tasks/ running.     Baseline  L shoulder strength grossly 3+/5  MMT, biceps/triceps 4/5 MMT.     Time  4    Period  Weeks    Status  Not Met    Target Date  05/19/19      PT LONG TERM GOAL #3   Title  Pt. will increase FOTO to 68 to improve R shoulder mobility.      Baseline  FOTO: baseline 46/ goal 68    Time  4    Period  Weeks    Status  On-going    Target Date  05/19/19      PT LONG TERM GOAL #4   Title  Pt. able to complete all work related tasks with no shoulder limitations or pain to promote return to PLOF.      Baseline  Pt. has not returned to work at this time due to [employer] restrictions.     Time  4    Period  Weeks    Status  On-going    Target Date  05/19/19            Plan - 04/25/19 0754    Clinical Impression Statement  Pt. entered PT with no c/o L shoulder pain.  Pt. states she remains pain limited with any overhead reaching tasks.  Pt. requires continued cuing/ encouragement to progress with ROM/ resisted ther.ex.  Pt. complains of pain with any ex. on Nautilus.  PT explained how important stretching/  strengthening ex. are on a consistent basis.    Personal Factors and Comorbidities  Age;Fitness    Stability/Clinical Decision Making  Stable/Uncomplicated    Rehab Potential  Good    PT Frequency  2x / week    PT Duration  4 weeks    PT Treatment/Interventions  ADLs/Self Care Home Management;Aquatic Therapy;Cryotherapy;Electrical Stimulation;Therapeutic exercise;Patient/family education;Neuromuscular re-education;Manual techniques;Scar mobilization;Passive range of motion;Functional mobility training    PT Next Visit Plan  Progress L shoulder AROM (follow MD protocol).   Strength training/ reassess HEP and compliance    PT Home Exercise Plan  See handouts       Patient will benefit from skilled therapeutic intervention in order to improve the following deficits and impairments:  Pain, Decreased mobility, Decreased scar mobility, Hypomobility, Decreased strength, Decreased range of motion, Decreased endurance, Decreased activity tolerance, Impaired flexibility  Visit Diagnosis: Shoulder joint stiffness, left   Shoulder joint stiffness, right   Acute pain of left shoulder   Chronic left shoulder pain   Muscle weakness (generalized)      Problem List Patient Active Problem List   Diagnosis Date Noted  . Generalized abdominal pain 06/19/2018  . Nausea and vomiting 06/19/2018  . Loss of weight 06/19/2018  . Headache 12/18/2012  . Hypothermia 12/18/2012   Pura Spice, PT, DPT # [phone] 04/25/2019, 3:39 PM  [place] [facility] [address] [place], [place], [address] Phone: [phone]   Fax:  [phone]  Name: Sara Gomez MRN: [id number] Date of Birth: Mar 24, 1996

## 2019-04-30 ENCOUNTER — Other Ambulatory Visit: Payer: Self-pay

## 2019-04-30 ENCOUNTER — Encounter: Payer: Self-pay | Admitting: Physical Therapy

## 2019-04-30 ENCOUNTER — Ambulatory Visit: Payer: BC Managed Care – PPO | Admitting: Physical Therapy

## 2019-04-30 DIAGNOSIS — M25611 Stiffness of right shoulder, not elsewhere classified: Secondary | ICD-10-CM

## 2019-04-30 DIAGNOSIS — G8929 Other chronic pain: Secondary | ICD-10-CM

## 2019-04-30 DIAGNOSIS — M25512 Pain in left shoulder: Secondary | ICD-10-CM

## 2019-04-30 DIAGNOSIS — M25511 Pain in right shoulder: Secondary | ICD-10-CM

## 2019-04-30 DIAGNOSIS — M25612 Stiffness of left shoulder, not elsewhere classified: Secondary | ICD-10-CM

## 2019-04-30 DIAGNOSIS — M6281 Muscle weakness (generalized): Secondary | ICD-10-CM

## 2019-04-30 NOTE — Therapy (Signed)
[facility] [address] [place], [place], [address] Phone: [phone]   Fax:  [phone]  Physical Therapy Treatment  Patient Details  Name: Sara Gomez MRN: [id number] Date of Birth: Feb 25, 1996 Referring Provider (PT): Dr. Jeannie Fend   Encounter Date: 04/30/2019  Treatment 13 of 19.  Recert date: 04/15/3353 5625 to 6389    Past Medical History:  Diagnosis Date  . Abdominal pain 05/23/2018  . Nausea and vomiting 05/23/2018   Per NM Hepato w Eject Fract order    Past Surgical History:  Procedure Laterality Date  . COLONOSCOPY  06/07/2018   Dr Alice Reichert  . SHOULDER ARTHROSCOPY     x2  . SHOULDER LATERJET     x3  . TONSILLECTOMY  2005  . UPPER GI ENDOSCOPY  06/07/2018   Dr Alice Reichert  . WISDOM TOOTH EXTRACTION      There were no vitals filed for this visit.     No L shoulder pain at this time.      There.ex.:  Standing B shoulder AROM all planes (reassessment)- guarded/ pain limited.    Supine serratus punches (manual feedback/ resistance)- 25x Supine 2# shoulder flexion/ horizontal abduction/ adduction/ bicep curls/ tricep extension 20x each (extra time and encouragement required). No Nautilus today  Manual tx.:   Supine L shoulder AA/PROM (all planes of movement)- gentle oscillations to decrease muscle guarding Supine L AP/PA grade II-III mobs. 3x20 sec. STM to L UT/ deltoid musculature/ scar.      PT Long Term Goals - 04/21/19 0716      PT LONG TERM GOAL #1   Title  Pt. I with HEP to increase L shoulder AROM to [facility] as compared to R shoulder to improve pain-free mobility.      Baseline  L shoulder AROM in supine position: flexion 138 deg./ abduction 128 deg./ IR 72 deg./ ER 32 (pain).     Time  4    Period  Weeks    Status  Not Met    Target Date  05/19/19      PT LONG TERM GOAL #2   Title  Pt. will increase L deltoid/ biceps/ triceps strength to grossly 4/5 MMT to improve return to household  tasks/ running.     Baseline  L shoulder strength grossly 3+/5 MMT, biceps/triceps 4/5 MMT.     Time  4    Period  Weeks    Status  Not Met    Target Date  05/19/19      PT LONG TERM GOAL #3   Title  Pt. will increase FOTO to 68 to improve R shoulder mobility.      Baseline  FOTO: baseline 46/ goal 68    Time  4    Period  Weeks    Status  On-going    Target Date  05/19/19      PT LONG TERM GOAL #4   Title  Pt. able to complete all work related tasks with no shoulder limitations or pain to promote return to PLOF.      Baseline  Pt. has not returned to work at this time due to [employer] restrictions.     Time  4    Period  Weeks    Status  On-going    Target Date  05/19/19         Plan - 05/02/19 1640    Clinical Impression Statement  Pt. reports no L shoulder pain currently at rest.  Pt.  states she is tired this morning and requires motivation to participate with strengthening ex.  L shoulder flexion/ abduciton remains pain limited in all positions.  STM to L anterior deltoid/ scar during AA/PROM to encourage increase ROM.  Limited tolerance to resisted ther.ex. today.    Personal Factors and Comorbidities  Age;Fitness    Stability/Clinical Decision Making  Stable/Uncomplicated    Rehab Potential  Good    PT Frequency  2x / week    PT Duration  4 weeks    PT Treatment/Interventions  ADLs/Self Care Home Management;Aquatic Therapy;Cryotherapy;Electrical Stimulation;Therapeutic exercise;Patient/family education;Neuromuscular re-education;Manual techniques;Scar mobilization;Passive range of motion;Functional mobility training    PT Next Visit Plan  Progress L shoulder AROM (follow MD protocol).   Strength training/ reassess HEP and compliance    PT Home Exercise Plan  See handouts       Patient will benefit from skilled therapeutic intervention in order to improve the following deficits and impairments:  Pain, Decreased mobility, Decreased scar mobility, Hypomobility, Decreased  strength, Decreased range of motion, Decreased endurance, Decreased activity tolerance, Impaired flexibility  Visit Diagnosis: 1. Shoulder joint stiffness, left   2. Shoulder joint stiffness, right   3. Acute pain of left shoulder   4. Chronic left shoulder pain   5. Muscle weakness (generalized)   6. Acute pain of right shoulder        Problem List Patient Active Problem List   Diagnosis Date Noted  . Generalized abdominal pain 06/19/2018  . Nausea and vomiting 06/19/2018  . Loss of weight 06/19/2018  . Headache 12/18/2012  . Hypothermia 12/18/2012   Pura Spice, PT, DPT # [phone] 05/03/2019, 8:18 AM  [place] [facility] [address] [place], [place], [address] Phone: [phone]   Fax:  [phone]  Name: Sara Gomez MRN: [id number] Date of Birth: 1996-05-08

## 2019-05-02 ENCOUNTER — Other Ambulatory Visit: Payer: Self-pay

## 2019-05-02 ENCOUNTER — Ambulatory Visit: Payer: BC Managed Care – PPO | Admitting: Physical Therapy

## 2019-05-02 DIAGNOSIS — G8929 Other chronic pain: Secondary | ICD-10-CM

## 2019-05-02 DIAGNOSIS — M25512 Pain in left shoulder: Secondary | ICD-10-CM

## 2019-05-02 DIAGNOSIS — M6281 Muscle weakness (generalized): Secondary | ICD-10-CM

## 2019-05-02 DIAGNOSIS — M25612 Stiffness of left shoulder, not elsewhere classified: Secondary | ICD-10-CM

## 2019-05-03 ENCOUNTER — Encounter: Payer: Self-pay | Admitting: Physical Therapy

## 2019-05-03 NOTE — Therapy (Signed)
[facility] [address] [place], [place], [address] Phone: [phone]   Fax:  [phone]  Physical Therapy Treatment  Patient Details  Name: Sara Gomez MRN: [id number] Date of Birth: 1996/07/08 Referring Provider (PT): Dr. Jeannie Fend   Encounter Date: 05/02/2019  PT End of Session - 05/03/19 0826    Visit Number  14    Number of Visits  19    Date for PT Re-Evaluation  05/19/19    PT Start Time  0724    PT Stop Time  0829    PT Time Calculation (min)  65 min    Activity Tolerance  Patient tolerated treatment well;Patient limited by pain    Behavior During Therapy  [facility] for tasks assessed/performed       Past Medical History:  Diagnosis Date  . Abdominal pain 05/23/2018  . Nausea and vomiting 05/23/2018   Per NM Hepato w Eject Fract order    Past Surgical History:  Procedure Laterality Date  . COLONOSCOPY  06/07/2018   Dr Alice Reichert  . SHOULDER ARTHROSCOPY     x2  . SHOULDER LATERJET     x3  . TONSILLECTOMY  2005  . UPPER GI ENDOSCOPY  06/07/2018   Dr Alice Reichert  . WISDOM TOOTH EXTRACTION      There were no vitals filed for this visit.  Subjective Assessment - 05/03/19 0822    Subjective  Pt. entered PT with c/o recent concussion after hitting head on beam in stairway at family's house.  No c/o L shoulder issues prior to tx. session but pt. demonstrates limited L shoulder AROM.    Pertinent History  See previous PMHx.  Pt. known well to PT clinic.      Limitations  Lifting;Standing;Writing;House hold activities    Patient Stated Goals  Increase R and L shoulder ROM/ strength to improve pain-free mobility.      Currently in Pain?  No/denies    Pain Onset  More than a month ago    Pain Onset  More than a month ago        Manual tx.:  STM to L shoulder/ scar during AA/PROM in supine position. Supine L shoulder grade II-III AP/PA mobs. 3x20 sec.  Supine pec. Stretches with static holds/ overpressure as  tolerated  There.ex.:  Supine 2# shoulder flexion/ horizontal abduction/ adduction/ bicep curls/ tricep extension/ serratus punches 15x2 (rest breaks/ extra time and encouragement required to complete). Supine AROM (all planes) Supine rhythmic stabs with L/R shoulder (min. Resistance from PT).   No changes to HEP.     PT Long Term Goals - 04/21/19 0716      PT LONG TERM GOAL #1   Title  Pt. I with HEP to increase L shoulder AROM to [facility] as compared to R shoulder to improve pain-free mobility.      Baseline  L shoulder AROM in supine position: flexion 138 deg./ abduction 128 deg./ IR 72 deg./ ER 32 (pain).     Time  4    Period  Weeks    Status  Not Met    Target Date  05/19/19      PT LONG TERM GOAL #2   Title  Pt. will increase L deltoid/ biceps/ triceps strength to grossly 4/5 MMT to improve return to household tasks/ running.     Baseline  L shoulder strength grossly 3+/5 MMT, biceps/triceps 4/5 MMT.     Time  4    Period  Weeks  Status  Not Met    Target Date  05/19/19      PT LONG TERM GOAL #3   Title  Pt. will increase FOTO to 68 to improve R shoulder mobility.      Baseline  FOTO: baseline 46/ goal 68    Time  4    Period  Weeks    Status  On-going    Target Date  05/19/19      PT LONG TERM GOAL #4   Title  Pt. able to complete all work related tasks with no shoulder limitations or pain to promote return to PLOF.      Baseline  Pt. has not returned to work at this time due to [employer] restrictions.     Time  4    Period  Weeks    Status  On-going    Target Date  05/19/19         Plan - 05/03/19 0826    Clinical Impression Statement  Pt. continues to demonstrate L shoulder guarding during session due to pain which does improve slightly after manual tx.  No standing ther.ex. today due to c/o recent concussion (?).  Continued slow progression of strengthening due to poor compliance with exercise.  PT focused on L shoulder mobility/ strengthening with light  resistance (2#)/ increase repetitions.  Pt heavily encouraged to continue her HEP and follow-up as scheduled.  Pt. presents with generalized B shoulder/UE muscle weakness and pain with AROM.    Personal Factors and Comorbidities  Age;Fitness    Stability/Clinical Decision Making  Stable/Uncomplicated    Rehab Potential  Good    PT Frequency  2x / week    PT Duration  4 weeks    PT Treatment/Interventions  ADLs/Self Care Home Management;Aquatic Therapy;Cryotherapy;Electrical Stimulation;Therapeutic exercise;Patient/family education;Neuromuscular re-education;Manual techniques;Scar mobilization;Passive range of motion;Functional mobility training    PT Next Visit Plan  Progress L shoulder AROM (follow MD protocol).   Strength training/ reassess HEP and compliance    PT Home Exercise Plan  See handouts       Patient will benefit from skilled therapeutic intervention in order to improve the following deficits and impairments:  Pain, Decreased mobility, Decreased scar mobility, Hypomobility, Decreased strength, Decreased range of motion, Decreased endurance, Decreased activity tolerance, Impaired flexibility  Visit Diagnosis: 1. Shoulder joint stiffness, left   2. Acute pain of left shoulder   3. Chronic left shoulder pain   4. Muscle weakness (generalized)        Problem List Patient Active Problem List   Diagnosis Date Noted  . Generalized abdominal pain 06/19/2018  . Nausea and vomiting 06/19/2018  . Loss of weight 06/19/2018  . Headache 12/18/2012  . Hypothermia 12/18/2012   Pura Spice, PT, DPT # [id number] 05/03/2019, 8:34 AM  [place] [facility] [address] [place], [place], [address] Phone: [phone]   Fax:  [phone]  Name: Sara Gomez MRN: [id number] Date of Birth: 19-Nov-1995

## 2019-05-05 ENCOUNTER — Ambulatory Visit: Payer: BC Managed Care – PPO | Admitting: Physical Therapy

## 2019-05-05 ENCOUNTER — Encounter: Payer: Self-pay | Admitting: Physical Therapy

## 2019-05-05 ENCOUNTER — Other Ambulatory Visit: Payer: Self-pay

## 2019-05-05 DIAGNOSIS — M25511 Pain in right shoulder: Secondary | ICD-10-CM

## 2019-05-05 DIAGNOSIS — M25512 Pain in left shoulder: Secondary | ICD-10-CM

## 2019-05-05 DIAGNOSIS — M25611 Stiffness of right shoulder, not elsewhere classified: Secondary | ICD-10-CM

## 2019-05-05 DIAGNOSIS — M25612 Stiffness of left shoulder, not elsewhere classified: Secondary | ICD-10-CM

## 2019-05-05 DIAGNOSIS — M6281 Muscle weakness (generalized): Secondary | ICD-10-CM

## 2019-05-05 DIAGNOSIS — G8929 Other chronic pain: Secondary | ICD-10-CM

## 2019-05-05 NOTE — Therapy (Signed)
[place] [facility] [address] [place], [place], [address] Phone: [phone]   Fax:  [phone]  Physical Therapy Treatment  Patient Details  Name: Rosamund Nyland Stricker MRN: [id number] Date of Birth: 1996/09/19 Referring Provider (PT): Dr. Jeannie Fend   Encounter Date: 05/05/2019   Past Medical History:  Diagnosis Date  . Abdominal pain 05/23/2018  . Nausea and vomiting 05/23/2018   Per [facility] Hepato w Eject Fract order    Past Surgical History:  Procedure Laterality Date  . COLONOSCOPY  06/07/2018   Dr Alice Reichert  . SHOULDER ARTHROSCOPY     x2  . SHOULDER LATERJET     x3  . TONSILLECTOMY  2005  . UPPER GI ENDOSCOPY  06/07/2018   Dr Alice Reichert  . WISDOM TOOTH EXTRACTION      There were no vitals filed for this visit.  Subjective Assessment - 05/05/19 0707    Subjective  Pt. called to cancel appt. due to migraine.  No treatment today.    Pertinent History  See previous PMHx.  Pt. known well to PT clinic.      Limitations  Lifting;Standing;Writing;House hold activities    Patient Stated Goals  Increase R and L shoulder ROM/ strength to improve pain-free mobility.      Pain Onset  More than a month ago    Pain Onset  More than a month ago         NO tx.   PT Long Term Goals - 04/21/19 0716      PT LONG TERM GOAL #1   Title  Pt. I with HEP to increase L shoulder AROM to [facility] as compared to R shoulder to improve pain-free mobility.      Baseline  L shoulder AROM in supine position: flexion 138 deg./ abduction 128 deg./ IR 72 deg./ ER 32 (pain).     Time  4    Period  Weeks    Status  Not Met    Target Date  05/19/19      PT LONG TERM GOAL #2   Title  Pt. will increase L deltoid/ biceps/ triceps strength to grossly 4/5 MMT to improve return to household tasks/ running.     Baseline  L shoulder strength grossly 3+/5 MMT, biceps/triceps 4/5 MMT.     Time  4    Period  Weeks    Status  Not Met    Target Date  05/19/19      PT LONG  TERM GOAL #3   Title  Pt. will increase FOTO to 68 to improve R shoulder mobility.      Baseline  FOTO: baseline 46/ goal 68    Time  4    Period  Weeks    Status  On-going    Target Date  05/19/19      PT LONG TERM GOAL #4   Title  Pt. able to complete all work related tasks with no shoulder limitations or pain to promote return to PLOF.      Baseline  Pt. has not returned to work at this time due to [employer] restrictions.     Time  4    Period  Weeks    Status  On-going    Target Date  05/19/19            Plan - 05/05/19 0708    Clinical Impression Statement  No treatment today due to pt. cancelling with migraine.  Pt. instructed to focus on HEP  later today.    Personal Factors and Comorbidities  Age;Fitness    Stability/Clinical Decision Making  Stable/Uncomplicated    Rehab Potential  Good    PT Frequency  2x / week    PT Duration  4 weeks    PT Treatment/Interventions  ADLs/Self Care Home Management;Aquatic Therapy;Cryotherapy;Electrical Stimulation;Therapeutic exercise;Patient/family education;Neuromuscular re-education;Manual techniques;Scar mobilization;Passive range of motion;Functional mobility training    PT Next Visit Plan  Progress L shoulder AROM and strength training as tolerated.    PT Home Exercise Plan  See handouts       Patient will benefit from skilled therapeutic intervention in order to improve the following deficits and impairments:  Pain, Decreased mobility, Decreased scar mobility, Hypomobility, Decreased strength, Decreased range of motion, Decreased endurance, Decreased activity tolerance, Impaired flexibility  Visit Diagnosis: 1. Shoulder joint stiffness, left   2. Chronic left shoulder pain   3. Muscle weakness (generalized)   4. Shoulder joint stiffness, right   5. Acute pain of right shoulder        Problem List Patient Active Problem List   Diagnosis Date Noted  . Generalized abdominal pain 06/19/2018  . Nausea and vomiting 06/19/2018   . Loss of weight 06/19/2018  . Headache 12/18/2012  . Hypothermia 12/18/2012   Pura Spice, PT, DPT # [id number] 05/05/2019, 7:16 AM  [place] [facility] [address] [place], [place], [address] Phone: [phone]   Fax:  [phone]  Name: Shereese Bonnie Pancoast MRN: [id number] Date of Birth: 14-Jun-1996

## 2019-05-07 ENCOUNTER — Ambulatory Visit: Payer: BC Managed Care – PPO | Admitting: Physical Therapy

## 2019-05-07 ENCOUNTER — Encounter: Payer: Self-pay | Admitting: Physical Therapy

## 2019-05-07 ENCOUNTER — Other Ambulatory Visit: Payer: Self-pay

## 2019-05-07 DIAGNOSIS — M25512 Pain in left shoulder: Secondary | ICD-10-CM

## 2019-05-07 DIAGNOSIS — M25612 Stiffness of left shoulder, not elsewhere classified: Secondary | ICD-10-CM

## 2019-05-07 DIAGNOSIS — M6281 Muscle weakness (generalized): Secondary | ICD-10-CM

## 2019-05-07 DIAGNOSIS — G8929 Other chronic pain: Secondary | ICD-10-CM

## 2019-05-07 DIAGNOSIS — M25611 Stiffness of right shoulder, not elsewhere classified: Secondary | ICD-10-CM

## 2019-05-07 NOTE — Therapy (Signed)
[place] [facility] [address] [place], [place], [address] Phone: [phone]   Fax:  [phone]  Physical Therapy Treatment  Patient Details  Name: Sara Gomez MRN: [id number] Date of Birth: [birth year] Referring Provider (PT): Dr. Jeannie Fend   Encounter Date: 05/07/2019  PT End of Session - 05/07/19 0727    Visit Number  15    Number of Visits  19    Date for PT Re-Evaluation  05/19/19    PT Start Time  0725    PT Stop Time  0816    PT Time Calculation (min)  51 min    Activity Tolerance  Patient tolerated treatment well;Patient limited by pain    Behavior During Therapy  [facility] for tasks assessed/performed       Past Medical History:  Diagnosis Date  . Abdominal pain 05/23/2018  . Nausea and vomiting 05/23/2018   Per NM Hepato w Eject Fract order    Past Surgical History:  Procedure Laterality Date  . COLONOSCOPY  06/07/2018   Dr Alice Reichert  . SHOULDER ARTHROSCOPY     x2  . SHOULDER LATERJET     x3  . TONSILLECTOMY  2005  . UPPER GI ENDOSCOPY  06/07/2018   Dr Alice Reichert  . WISDOM TOOTH EXTRACTION      There were no vitals filed for this visit.  Subjective Assessment - 05/07/19 0724    Subjective  Pt. states she went Jet skiing yesterday and is really sore/ hurting today.  Pt. reports 5/10 B sh. shoulder pain currently. Pt. states "I am not doing any strengthening this morning because I am so sore"    Pertinent History  See previous PMHx.  Pt. known well to PT clinic.      Limitations  Lifting;Standing;Writing;House hold activities    Patient Stated Goals  Increase R and L shoulder ROM/ strength to improve pain-free mobility.      Currently in Pain?  Yes    Pain Score  5     Pain Location  Shoulder    Pain Orientation  Right;Left    Pain Descriptors / Indicators  Aching;Sore    Pain Onset  More than a month ago    Pain Onset  More than a month ago         Manual tx.:  Prone/ R sidelying/ supine AA/PROM all planes  of movement 10x each.  Gentle oscillations to decrease guarding/ soreness/  Supine L shoulder grade II-III AP/PA/inf. mobs. 3x20 sec. STM L/R UT/ posterior deltoid musculature with added cervical traction (static holds)- 5x  There.ex.:  Supine/seated A/AROM 10x (all planes) Reviewed HEP     PT Long Term Goals - 04/21/19 0716      PT LONG TERM GOAL #1   Title  Pt. I with HEP to increase L shoulder AROM to [facility] as compared to R shoulder to improve pain-free mobility.      Baseline  L shoulder AROM in supine position: flexion 138 deg./ abduction 128 deg./ IR 72 deg./ ER 32 (pain).     Time  4    Period  Weeks    Status  Not Met    Target Date  05/19/19      PT LONG TERM GOAL #2   Title  Pt. will increase L deltoid/ biceps/ triceps strength to grossly 4/5 MMT to improve return to household tasks/ running.     Baseline  L shoulder strength grossly 3+/5 MMT, biceps/triceps 4/5 MMT.  Time  4    Period  Weeks    Status  Not Met    Target Date  05/19/19      PT LONG TERM GOAL #3   Title  Pt. will increase FOTO to 68 to improve R shoulder mobility.      Baseline  FOTO: baseline 46/ goal 68    Time  4    Period  Weeks    Status  On-going    Target Date  05/19/19      PT LONG TERM GOAL #4   Title  Pt. able to complete all work related tasks with no shoulder limitations or pain to promote return to PLOF.      Baseline  Pt. has not returned to work at this time due to [employer] restrictions.     Time  4    Period  Weeks    Status  On-going    Target Date  05/19/19            Plan - 05/07/19 0727    Clinical Impression Statement  PT focused on L shoulder/ scapular mobility and no strength training.  Marked increase in L shoulder active flexion in supine/ abduction in sidelying position after shoulder mobs./ STM.  Pt. pain focused/ sore today with manual tx. and STM.  Pt. encouraged to complete strengthening HEP tomorrow prior to returning to PT this Friday.    Personal Factors  and Comorbidities  Age;Fitness    Stability/Clinical Decision Making  Stable/Uncomplicated    Rehab Potential  Good    PT Frequency  2x / week    PT Duration  4 weeks    PT Treatment/Interventions  ADLs/Self Care Home Management;Aquatic Therapy;Cryotherapy;Electrical Stimulation;Therapeutic exercise;Patient/family education;Neuromuscular re-education;Manual techniques;Scar mobilization;Passive range of motion;Functional mobility training    PT Next Visit Plan  Progress L shoulder AROM (follow MD protocol).   Strength training/ reassess HEP and compliance    PT Home Exercise Plan  See handouts       Patient will benefit from skilled therapeutic intervention in order to improve the following deficits and impairments:  Pain, Decreased mobility, Decreased scar mobility, Hypomobility, Decreased strength, Decreased range of motion, Decreased endurance, Decreased activity tolerance, Impaired flexibility  Visit Diagnosis: 1. Shoulder joint stiffness, left   2. Chronic left shoulder pain   3. Muscle weakness (generalized)   4. Shoulder joint stiffness, right        Problem List Patient Active Problem List   Diagnosis Date Noted  . Generalized abdominal pain 06/19/2018  . Nausea and vomiting 06/19/2018  . Loss of weight 06/19/2018  . Headache 12/18/2012  . Hypothermia 12/18/2012   Pura Spice, PT, DPT # [phone] 05/07/2019, 8:43 AM  [facility] [address] [place], [place], [address] Phone: [phone]   Fax:  [phone]  Name: Sara Gomez MRN: [id number] Date of Birth: 17-Apr-1996

## 2019-05-09 ENCOUNTER — Other Ambulatory Visit: Payer: Self-pay

## 2019-05-09 ENCOUNTER — Ambulatory Visit: Payer: BC Managed Care – PPO | Admitting: Physical Therapy

## 2019-05-09 DIAGNOSIS — M25612 Stiffness of left shoulder, not elsewhere classified: Secondary | ICD-10-CM | POA: Diagnosis not present

## 2019-05-09 DIAGNOSIS — M6281 Muscle weakness (generalized): Secondary | ICD-10-CM

## 2019-05-09 DIAGNOSIS — G8929 Other chronic pain: Secondary | ICD-10-CM

## 2019-05-09 DIAGNOSIS — M25611 Stiffness of right shoulder, not elsewhere classified: Secondary | ICD-10-CM

## 2019-05-10 NOTE — Therapy (Addendum)
[facility] [address] [place], [place], [address] Phone: [phone]   Fax:  [phone]  Physical Therapy Treatment  Patient Details  Name: Cidney Kirkwood Rochel MRN: [id number] Date of Birth: 1996/01/30 Referring Provider (PT): Dr. Jeannie Fend   Encounter Date: 05/09/2019  PT End of Session - 05/12/19 0827    Visit Number  16    Number of Visits  19    Date for PT Re-Evaluation  05/19/19    PT Start Time  0724    PT Stop Time  0818    PT Time Calculation (min)  54 min    Activity Tolerance  Patient tolerated treatment well;Patient limited by pain    Behavior During Therapy  [facility] for tasks assessed/performed       Past Medical History:  Diagnosis Date  . Abdominal pain 05/23/2018  . Nausea and vomiting 05/23/2018   Per NM Hepato w Eject Fract order    Past Surgical History:  Procedure Laterality Date  . COLONOSCOPY  06/07/2018   Dr Alice Reichert  . SHOULDER ARTHROSCOPY     x2  . SHOULDER LATERJET     x3  . TONSILLECTOMY  2005  . UPPER GI ENDOSCOPY  06/07/2018   Dr Alice Reichert  . WISDOM TOOTH EXTRACTION      There were no vitals filed for this visit.  Subjective Assessment - 05/12/19 0817    Subjective  No new complaints.  Pt. reports persistent L shoulder issues/ discomfort with functional movement patterns.    Pertinent History  See previous PMHx.  Pt. known well to PT clinic.      Limitations  Lifting;Standing;Writing;House hold activities    Patient Stated Goals  Increase R and L shoulder ROM/ strength to improve pain-free mobility.      Currently in Pain?  Yes    Pain Score  4     Pain Location  Shoulder    Pain Orientation  Left    Pain Descriptors / Indicators  Aching    Pain Onset  More than a month ago    Pain Score  3    Pain Location  Shoulder    Pain Orientation  Right    Pain Descriptors / Indicators  Aching    Pain Type  Chronic pain    Pain Onset  More than a month ago         There.ex.:  Standing B  shoulder AROM (all planes) at mirror with PT feedback for posture/ prevent compensatory movement patterns in trunk Nautilus: 20# lat. Pull downs/ 10# tricep extension/ 10# chest press/ 20# scap. Retraction 20x each. Standing 2# bicep curls 25x. Supine 2# serratus punches/ 1# horizontal shoulder abduction/ adduction 10x2 each.  PT assist for elbow control/ proper technique  Manual tx.:  Supine AA/PROM L shoulder all planes of movement (as tolerated) STM to L UT/ deltoid region in supine.   Reassessment of R shoulder stability.      PT Long Term Goals - 04/21/19 0716      PT LONG TERM GOAL #1   Title  Pt. I with HEP to increase L shoulder AROM to [facility] as compared to R shoulder to improve pain-free mobility.      Baseline  L shoulder AROM in supine position: flexion 138 deg./ abduction 128 deg./ IR 72 deg./ ER 32 (pain).     Time  4    Period  Weeks    Status  Not Met    Target  Date  05/19/19      PT LONG TERM GOAL #2   Title  Pt. will increase L deltoid/ biceps/ triceps strength to grossly 4/5 MMT to improve return to household tasks/ running.     Baseline  L shoulder strength grossly 3+/5 MMT, biceps/triceps 4/5 MMT.     Time  4    Period  Weeks    Status  Not Met    Target Date  05/19/19      PT LONG TERM GOAL #3   Title  Pt. will increase FOTO to 68 to improve R shoulder mobility.      Baseline  FOTO: baseline 46/ goal 68    Time  4    Period  Weeks    Status  On-going    Target Date  05/19/19      PT LONG TERM GOAL #4   Title  Pt. able to complete all work related tasks with no shoulder limitations or pain to promote return to PLOF.      Baseline  Pt. has not returned to work at this time due to [employer] restrictions.     Time  4    Period  Weeks    Status  On-going    Target Date  05/19/19            Plan - 05/12/19 9379    Clinical Impression Statement  PT encouraged pt. t/o tx. with resisted ther.ex. on B shoulder with use of Nautilus and 2# wts. Pt. remains  limited with L shoulder ROM and guarded/ pain focused with sh. abd./ER.  Pt. able to complete 20 repetitions with PT encouragement t/o tx. and instructions for proper technique/ posture.  Pt. tends to compensate with lateral trunk movement due to limited L shoulder flexion/ abduction.  No increase c/o L shoulder pain after tx. session but moderate fatigue reported.  Pt. states she is done with strengthening ex. today after 27 min.  PT focused on L shoulder joint capsule mobility/ pain mgmt./ PROM after resisted ther.ex.    Personal Factors and Comorbidities  Age;Fitness    Stability/Clinical Decision Making  Stable/Uncomplicated    Clinical Decision Making  Moderate    Rehab Potential  Good    PT Frequency  2x / week    PT Duration  4 weeks    PT Treatment/Interventions  ADLs/Self Care Home Management;Aquatic Therapy;Cryotherapy;Electrical Stimulation;Therapeutic exercise;Patient/family education;Neuromuscular re-education;Manual techniques;Scar mobilization;Passive range of motion;Functional mobility training    PT Next Visit Plan  Progress L shoulder AROM (follow MD protocol).   Strength training/ reassess HEP and compliance    PT Home Exercise Plan  See handouts       Patient will benefit from skilled therapeutic intervention in order to improve the following deficits and impairments:  Pain, Decreased mobility, Decreased scar mobility, Hypomobility, Decreased strength, Decreased range of motion, Decreased endurance, Decreased activity tolerance, Impaired flexibility  Visit Diagnosis: 1. Shoulder joint stiffness, left   2. Chronic left shoulder pain   3. Muscle weakness (generalized)   4. Shoulder joint stiffness, right        Problem List Patient Active Problem List   Diagnosis Date Noted  . Generalized abdominal pain 06/19/2018  . Nausea and vomiting 06/19/2018  . Loss of weight 06/19/2018  . Headache 12/18/2012  . Hypothermia 12/18/2012   Pura Spice, PT, DPT #  [phone] 05/12/2019, 8:34 AM  [place] [facility] [address] [place], [place], [address] Phone: [phone]  Fax:  [phone]  Name: NEDDIE STEEDMAN MRN: [id number] Date of Birth: [birth year]

## 2019-05-12 ENCOUNTER — Encounter: Payer: Self-pay | Admitting: Physical Therapy

## 2019-05-12 ENCOUNTER — Ambulatory Visit: Payer: BC Managed Care – PPO | Admitting: Physical Therapy

## 2019-05-12 ENCOUNTER — Other Ambulatory Visit: Payer: Self-pay

## 2019-05-12 DIAGNOSIS — M25512 Pain in left shoulder: Secondary | ICD-10-CM

## 2019-05-12 DIAGNOSIS — M25612 Stiffness of left shoulder, not elsewhere classified: Secondary | ICD-10-CM | POA: Diagnosis not present

## 2019-05-12 DIAGNOSIS — M6281 Muscle weakness (generalized): Secondary | ICD-10-CM

## 2019-05-12 DIAGNOSIS — G8929 Other chronic pain: Secondary | ICD-10-CM

## 2019-05-12 DIAGNOSIS — M25611 Stiffness of right shoulder, not elsewhere classified: Secondary | ICD-10-CM

## 2019-05-12 DIAGNOSIS — M25511 Pain in right shoulder: Secondary | ICD-10-CM

## 2019-05-12 NOTE — Therapy (Signed)
[place] [facility] [address] [place], [place], [address] Phone: [phone]   Fax:  [phone]  Physical Therapy Treatment  Patient Details  Name: Sara Gomez MRN: [id number] Date of Birth: [birth year] Referring Provider (PT): Dr. Jeannie Fend   Encounter Date: 05/12/2019  PT End of Session - 05/12/19 0848    Visit Number  17    Number of Visits  19    Date for PT Re-Evaluation  05/19/19    PT Start Time  0729    PT Stop Time  0820    PT Time Calculation (min)  51 min    Activity Tolerance  Patient tolerated treatment well;Patient limited by pain    Behavior During Therapy  [facility] for tasks assessed/performed       Past Medical History:  Diagnosis Date  . Abdominal pain 05/23/2018  . Nausea and vomiting 05/23/2018   Per NM Hepato w Eject Fract order    Past Surgical History:  Procedure Laterality Date  . COLONOSCOPY  06/07/2018   Dr Alice Reichert  . SHOULDER ARTHROSCOPY     x2  . SHOULDER LATERJET     x3  . TONSILLECTOMY  2005  . UPPER GI ENDOSCOPY  06/07/2018   Dr Alice Reichert  . WISDOM TOOTH EXTRACTION      There were no vitals filed for this visit.  Subjective Assessment - 05/12/19 0846    Subjective  Pt. reports no new issues with L shoulder but states her R shoulder/ UE hurt today.  Pt. states she played 4 hours of beer pong yesterday and really hurts in R shoulder.  Pt. states she feels R shoulder is coming out of joint.    Pertinent History  See previous PMHx.  Pt. known well to PT clinic.      Limitations  Lifting;Standing;Writing;House hold activities    Patient Stated Goals  Increase R and L shoulder ROM/ strength to improve pain-free mobility.      Currently in Pain?  Yes    Pain Score  4     Pain Location  Shoulder    Pain Orientation  Right    Pain Descriptors / Indicators  Aching    Pain Onset  More than a month ago    Pain Onset  More than a month ago        Manual tx.:  Supine L shoulder grade II-III AP/PA  mobs. 2x20 sec. Each. STM to L UT region and cervical UT/levator stretches 3x each L shoulder AA/PROM 5x all planes of movement.    There.ex.:  Supine 2# L shoulder serratus punches/ ER/ IR/ bicep curls/ tricep extension 20x each Supine 1# L shoulder horizontal abduction/ adduction 10x2. Supine L shoulder rhythmic stabs at 90 deg. L shoulder flexion (min. Resistance) 3x20 sec. (fatigue) Issued YTB    PT Long Term Goals - 04/21/19 0716      PT LONG TERM GOAL #1   Title  Pt. I with HEP to increase L shoulder AROM to [facility] as compared to R shoulder to improve pain-free mobility.      Baseline  L shoulder AROM in supine position: flexion 138 deg./ abduction 128 deg./ IR 72 deg./ ER 32 (pain).     Time  4    Period  Weeks    Status  Not Met    Target Date  05/19/19      PT LONG TERM GOAL #2   Title  Pt. will increase L deltoid/ biceps/ triceps  strength to grossly 4/5 MMT to improve return to household tasks/ running.     Baseline  L shoulder strength grossly 3+/5 MMT, biceps/triceps 4/5 MMT.     Time  4    Period  Weeks    Status  Not Met    Target Date  05/19/19      PT LONG TERM GOAL #3   Title  Pt. will increase FOTO to 68 to improve R shoulder mobility.      Baseline  FOTO: baseline 46/ goal 68    Time  4    Period  Weeks    Status  On-going    Target Date  05/19/19      PT LONG TERM GOAL #4   Title  Pt. able to complete all work related tasks with no shoulder limitations or pain to promote return to PLOF.      Baseline  Pt. has not returned to work at this time due to [employer] restrictions.     Time  4    Period  Weeks    Status  On-going    Target Date  05/19/19            Plan - 05/12/19 0849    Clinical Impression Statement  Pt. c/o increase R shoulder pain/ soreness over weekend and is not interested in using Nautilus machine today.  PT focused on L shoulder ROM/ stability ex. with 2# wt. in supine/ seated posture.  (+) R shoulder joint line tenderness with  palpation and movement.  PT recommended icing and avoiding repetitive tasks to R shoulder to decrease inflammation.  Pt. has marked L shoulder fatigue after ther.ex.  Pain limited L shoulder ER/ supine pec stretches during manual tx.  PT issued YTB for home use/ HEP.    Personal Factors and Comorbidities  Age;Fitness    Stability/Clinical Decision Making  Stable/Uncomplicated    Clinical Decision Making  Moderate    Rehab Potential  Good    PT Frequency  2x / week    PT Duration  4 weeks    PT Treatment/Interventions  ADLs/Self Care Home Management;Aquatic Therapy;Cryotherapy;Electrical Stimulation;Therapeutic exercise;Patient/family education;Neuromuscular re-education;Manual techniques;Scar mobilization;Passive range of motion;Functional mobility training    PT Next Visit Plan  Progress L shoulder AROM (follow MD protocol).   Strength training/ reassess HEP and compliance    PT Home Exercise Plan  See handouts       Patient will benefit from skilled therapeutic intervention in order to improve the following deficits and impairments:  Pain, Decreased mobility, Decreased scar mobility, Hypomobility, Decreased strength, Decreased range of motion, Decreased endurance, Decreased activity tolerance, Impaired flexibility  Visit Diagnosis: 1. Shoulder joint stiffness, left   2. Chronic left shoulder pain   3. Muscle weakness (generalized)   4. Shoulder joint stiffness, right   5. Acute pain of right shoulder        Problem List Patient Active Problem List   Diagnosis Date Noted  . Generalized abdominal pain 06/19/2018  . Nausea and vomiting 06/19/2018  . Loss of weight 06/19/2018  . Headache 12/18/2012  . Hypothermia 12/18/2012   Pura Spice, PT, DPT # [phone] 05/12/2019, 9:00 AM  [place] [address] [place], [place], [address] Phone: [phone]   Fax:  [phone]  Name: Sara Gomez MRN: [id number] Date of Birth:  Feb 28, 1996

## 2019-05-14 ENCOUNTER — Ambulatory Visit: Payer: BC Managed Care – PPO | Attending: Orthopedic Surgery | Admitting: Physical Therapy

## 2019-05-14 ENCOUNTER — Other Ambulatory Visit: Payer: Self-pay

## 2019-05-14 DIAGNOSIS — M25612 Stiffness of left shoulder, not elsewhere classified: Secondary | ICD-10-CM | POA: Insufficient documentation

## 2019-05-14 DIAGNOSIS — M25511 Pain in right shoulder: Secondary | ICD-10-CM | POA: Insufficient documentation

## 2019-05-14 DIAGNOSIS — M25611 Stiffness of right shoulder, not elsewhere classified: Secondary | ICD-10-CM | POA: Insufficient documentation

## 2019-05-14 DIAGNOSIS — G8929 Other chronic pain: Secondary | ICD-10-CM | POA: Insufficient documentation

## 2019-05-14 DIAGNOSIS — M25512 Pain in left shoulder: Secondary | ICD-10-CM | POA: Insufficient documentation

## 2019-05-14 DIAGNOSIS — M6281 Muscle weakness (generalized): Secondary | ICD-10-CM | POA: Insufficient documentation

## 2019-05-15 ENCOUNTER — Ambulatory Visit: Payer: BLUE CROSS/BLUE SHIELD

## 2019-05-16 NOTE — Therapy (Signed)
[place] [facility] [address] [place], [place], [address] Phone: [phone]   Fax:  [phone]  Physical Therapy Treatment  Patient Details  Name: Sara Gomez MRN: [id number] Date of Birth: December 05, 1995 Referring Provider (PT): Dr. Jeannie Fend   Encounter Date: 05/14/2019    Past Medical History:  Diagnosis Date  . Abdominal pain 05/23/2018  . Nausea and vomiting 05/23/2018   Per [place] Hepato w Eject Fract order    Past Surgical History:  Procedure Laterality Date  . COLONOSCOPY  06/07/2018   Dr Alice Reichert  . SHOULDER ARTHROSCOPY     x2  . SHOULDER LATERJET     x3  . TONSILLECTOMY  2005  . UPPER GI ENDOSCOPY  06/07/2018   Dr Alice Reichert  . WISDOM TOOTH EXTRACTION      There were no vitals filed for this visit.  Subjective Assessment - 05/16/19 0920    Subjective  Pt. contacted PT in morning to cancel appt.  Pt. reports she threw a football for 2 hours with R shoulder yesterday and L shoulder is doing okay today.    Pertinent History  See previous PMHx.  Pt. known well to PT clinic.      Limitations  Lifting;Standing;Writing;House hold activities    Patient Stated Goals  Increase R and L shoulder ROM/ strength to improve pain-free mobility.      Pain Onset  More than a month ago    Pain Onset  More than a month ago          No treatment today.      PT Long Term Goals - 04/21/19 0716      PT LONG TERM GOAL #1   Title  Pt. I with HEP to increase L shoulder AROM to [facility] as compared to R shoulder to improve pain-free mobility.      Baseline  L shoulder AROM in supine position: flexion 138 deg./ abduction 128 deg./ IR 72 deg./ ER 32 (pain).     Time  4    Period  Weeks    Status  Not Met    Target Date  05/19/19      PT LONG TERM GOAL #2   Title  Pt. will increase L deltoid/ biceps/ triceps strength to grossly 4/5 MMT to improve return to household tasks/ running.     Baseline  L shoulder strength grossly 3+/5 MMT,  biceps/triceps 4/5 MMT.     Time  4    Period  Weeks    Status  Not Met    Target Date  05/19/19      PT LONG TERM GOAL #3   Title  Pt. will increase FOTO to 68 to improve R shoulder mobility.      Baseline  FOTO: baseline 46/ goal 68    Time  4    Period  Weeks    Status  On-going    Target Date  05/19/19      PT LONG TERM GOAL #4   Title  Pt. able to complete all work related tasks with no shoulder limitations or pain to promote return to PLOF.      Baseline  Pt. has not returned to work at this time due to [employer] restrictions.     Time  4    Period  Weeks    Status  On-going    Target Date  05/19/19            Plan - 05/16/19 [phone]  Clinical Impression Statement  No treatment today secondary to patient cancelling.  PT will reassess pts. goals next tx. session.    Personal Factors and Comorbidities  Age;Fitness    Stability/Clinical Decision Making  Stable/Uncomplicated    Rehab Potential  Good    PT Frequency  2x / week    PT Duration  4 weeks    PT Treatment/Interventions  ADLs/Self Care Home Management;Aquatic Therapy;Cryotherapy;Electrical Stimulation;Therapeutic exercise;Patient/family education;Neuromuscular re-education;Manual techniques;Scar mobilization;Passive range of motion;Functional mobility training    PT Next Visit Plan  Progress L shoulder AROM (follow MD protocol).   Strength training/ reassess HEP and compliance    PT Home Exercise Plan  See handouts       Patient will benefit from skilled therapeutic intervention in order to improve the following deficits and impairments:  Pain, Decreased mobility, Decreased scar mobility, Hypomobility, Decreased strength, Decreased range of motion, Decreased endurance, Decreased activity tolerance, Impaired flexibility  Visit Diagnosis: 1. Shoulder joint stiffness, left   2. Chronic left shoulder pain   3. Muscle weakness (generalized)   4. Shoulder joint stiffness, right        Problem List Patient Active  Problem List   Diagnosis Date Noted  . Generalized abdominal pain 06/19/2018  . Nausea and vomiting 06/19/2018  . Loss of weight 06/19/2018  . Headache 12/18/2012  . Hypothermia 12/18/2012   Pura Spice, PT, DPT # [phone] 05/16/2019, 9:23 AM  [facility] [address] [place], [place], [address] Phone: [phone]   Fax:  [phone]  Name: Sara Gomez MRN: [id number] Date of Birth: Apr 04, 1996

## 2019-05-19 ENCOUNTER — Ambulatory Visit: Payer: BC Managed Care – PPO | Admitting: Physical Therapy

## 2019-05-19 ENCOUNTER — Other Ambulatory Visit: Payer: Self-pay

## 2019-05-19 ENCOUNTER — Encounter: Payer: Self-pay | Admitting: Physical Therapy

## 2019-05-19 DIAGNOSIS — G8929 Other chronic pain: Secondary | ICD-10-CM | POA: Diagnosis present

## 2019-05-19 DIAGNOSIS — M25512 Pain in left shoulder: Secondary | ICD-10-CM

## 2019-05-19 DIAGNOSIS — M25612 Stiffness of left shoulder, not elsewhere classified: Secondary | ICD-10-CM

## 2019-05-19 DIAGNOSIS — M6281 Muscle weakness (generalized): Secondary | ICD-10-CM | POA: Diagnosis present

## 2019-05-19 DIAGNOSIS — M25611 Stiffness of right shoulder, not elsewhere classified: Secondary | ICD-10-CM

## 2019-05-19 DIAGNOSIS — M25511 Pain in right shoulder: Secondary | ICD-10-CM | POA: Diagnosis present

## 2019-05-19 NOTE — Therapy (Signed)
[place] [facility] [address] [place], [place], [address] Phone: [phone]   Fax:  [phone]  Physical Therapy Treatment  Patient Details  Name: Sara Gomez MRN: [id number] Date of Birth: [birth year] Referring Provider (PT): Dr. Jeannie Fend   Encounter Date: 05/19/2019  PT End of Session - 05/19/19 0812    Visit Number  18    Number of Visits  19    Date for PT Re-Evaluation  05/19/19    PT Start Time  0728    PT Stop Time  0821    PT Time Calculation (min)  53 min    Activity Tolerance  Patient tolerated treatment well;Patient limited by pain    Behavior During Therapy  [facility] for tasks assessed/performed       Past Medical History:  Diagnosis Date  . Abdominal pain 05/23/2018  . Nausea and vomiting 05/23/2018   Per [place] Hepato w Eject Fract order    Past Surgical History:  Procedure Laterality Date  . COLONOSCOPY  06/07/2018   Dr Alice Reichert  . SHOULDER ARTHROSCOPY     x2  . SHOULDER LATERJET     x3  . TONSILLECTOMY  2005  . UPPER GI ENDOSCOPY  06/07/2018   Dr Alice Reichert  . WISDOM TOOTH EXTRACTION      There were no vitals filed for this visit.  Subjective Assessment - 05/19/19 0730    Subjective  Pt. entered PT with c/o R shoulder issues/ laxity.  Pt. reports minimal pain (2/10) with use of R shoulder.  Pt. states L shoulder only hurts with movement.  Poor compliance with HEP.    Pertinent History  See previous PMHx.  Pt. known well to PT clinic.      Limitations  Lifting;Standing;Writing;House hold activities    Patient Stated Goals  Increase R and L shoulder ROM/ strength to improve pain-free mobility.      Currently in Pain?  Yes    Pain Score  2     Pain Location  Shoulder    Pain Orientation  Right;Left    Pain Descriptors / Indicators  Aching    Pain Type  Chronic pain    Pain Onset  More than a month ago    Pain Onset  More than a month ago          Manual tx.:  Prone L/R shoulder capsular reassessment  (good mobility all planes) STM to L UT region and cervical UT/levator stretches 3x each L shoulder AA/PROM 5x all planes of movement.   Prone grade II-III PA unilateral mobs. To T4-8 (1x60 seconds)- good feedback "felt good"  There.ex.:  Prone L/R shoulder extension AROM/ manual isometric feedback (few repetitions) Supine 2# L shoulder press-ups/ tricep extension 20x.  Supine L sh. Horizontal abduction/ adduction (no wt.) with light PT assist.  Supine 3# bicep curls (poor technique) 10x2.  Nautilus: 20# lat. Pull downs 20x. Discussed HEP    PT Long Term Goals - 04/21/19 0716      PT LONG TERM GOAL #1   Title  Pt. I with HEP to increase L shoulder AROM to [facility] as compared to R shoulder to improve pain-free mobility.      Baseline  L shoulder AROM in supine position: flexion 138 deg./ abduction 128 deg./ IR 72 deg./ ER 32 (pain).     Time  4    Period  Weeks    Status  Not Met    Target Date  05/19/19  PT LONG TERM GOAL #2   Title  Pt. will increase L deltoid/ biceps/ triceps strength to grossly 4/5 MMT to improve return to household tasks/ running.     Baseline  L shoulder strength grossly 3+/5 MMT, biceps/triceps 4/5 MMT.     Time  4    Period  Weeks    Status  Not Met    Target Date  05/19/19      PT LONG TERM GOAL #3   Title  Pt. will increase FOTO to 68 to improve R shoulder mobility.      Baseline  FOTO: baseline 46/ goal 68    Time  4    Period  Weeks    Status  On-going    Target Date  05/19/19      PT LONG TERM GOAL #4   Title  Pt. able to complete all work related tasks with no shoulder limitations or pain to promote return to PLOF.      Baseline  Pt. has not returned to work at this time due to [employer] restrictions.     Time  4    Period  Weeks    Status  On-going    Target Date  05/19/19          Plan - 05/19/19 1119    Clinical Impression Statement  Pt. entered PT with persistent c/o R shoulder issues and has been active throwing football/  running.  Good B shoulder capsular mobility.  Pt. remains remains pain limited/ guarded with L shoulder flexion/ ER.  Pt. presents with moderate generalized muscle weakness during supine isometrics/ 2# wts.  PT attempted to increase repetitions and wt. to 3# but pt. very hesitant/ limited.  Pt. not cooperative with treatment plan for L shoulder strengthening/ progression per MD protocol.    Personal Factors and Comorbidities  Age;Fitness    Stability/Clinical Decision Making  Stable/Uncomplicated    Rehab Potential  Good    PT Frequency  2x / week    PT Duration  4 weeks    PT Treatment/Interventions  ADLs/Self Care Home Management;Aquatic Therapy;Cryotherapy;Electrical Stimulation;Therapeutic exercise;Patient/family education;Neuromuscular re-education;Manual techniques;Scar mobilization;Passive range of motion;Functional mobility training    PT Next Visit Plan  Progress L shoulder AROM (follow MD protocol).   Strength training/ reassess HEP and compliance.  CHECK GOALS    PT Home Exercise Plan  See handouts       Patient will benefit from skilled therapeutic intervention in order to improve the following deficits and impairments:  Pain, Decreased mobility, Decreased scar mobility, Hypomobility, Decreased strength, Decreased range of motion, Decreased endurance, Decreased activity tolerance, Impaired flexibility  Visit Diagnosis: 1. Shoulder joint stiffness, left   2. Chronic left shoulder pain   3. Muscle weakness (generalized)   4. Shoulder joint stiffness, right        Problem List Patient Active Problem List   Diagnosis Date Noted  . Generalized abdominal pain 06/19/2018  . Nausea and vomiting 06/19/2018  . Loss of weight 06/19/2018  . Headache 12/18/2012  . Hypothermia 12/18/2012   Pura Spice, PT, DPT # [phone] 05/19/2019, 11:27 AM   [facility] [address] [place], [place], [address] Phone: [phone]   Fax:   [phone]  Name: Sara Gomez MRN: [id number] Date of Birth: [birth year]

## 2019-05-21 ENCOUNTER — Ambulatory Visit: Payer: BC Managed Care – PPO | Admitting: Physical Therapy

## 2019-05-21 ENCOUNTER — Encounter: Payer: Self-pay | Admitting: Physical Therapy

## 2019-05-21 ENCOUNTER — Other Ambulatory Visit: Payer: Self-pay

## 2019-05-21 DIAGNOSIS — M25612 Stiffness of left shoulder, not elsewhere classified: Secondary | ICD-10-CM | POA: Diagnosis not present

## 2019-05-21 DIAGNOSIS — G8929 Other chronic pain: Secondary | ICD-10-CM

## 2019-05-21 DIAGNOSIS — M25611 Stiffness of right shoulder, not elsewhere classified: Secondary | ICD-10-CM

## 2019-05-21 DIAGNOSIS — M6281 Muscle weakness (generalized): Secondary | ICD-10-CM

## 2019-05-21 DIAGNOSIS — M25511 Pain in right shoulder: Secondary | ICD-10-CM

## 2019-05-21 DIAGNOSIS — M25512 Pain in left shoulder: Secondary | ICD-10-CM

## 2019-05-21 NOTE — Therapy (Signed)
[place] [facility] [address] [place], [place], [address] Phone: [phone]   Fax:  [phone]  Physical Therapy Treatment  Patient Details  Name: Sara Gomez MRN: [id number] Date of Birth: [birth year] Referring Provider (PT): Dr. Jeannie Fend   Encounter Date: 05/21/2019  PT End of Session - 05/21/19 0719    Visit Number  19    Number of Visits  27    Date for PT Re-Evaluation  06/18/19    PT Start Time  0732    PT Stop Time  0828    PT Time Calculation (min)  56 min    Activity Tolerance  Patient tolerated treatment well;Patient limited by pain    Behavior During Therapy  [facility] for tasks assessed/performed       Past Medical History:  Diagnosis Date  . Abdominal pain 05/23/2018  . Nausea and vomiting 05/23/2018   Per NM Hepato w Eject Fract order    Past Surgical History:  Procedure Laterality Date  . COLONOSCOPY  06/07/2018   Dr Alice Reichert  . SHOULDER ARTHROSCOPY     x2  . SHOULDER LATERJET     x3  . TONSILLECTOMY  2005  . UPPER GI ENDOSCOPY  06/07/2018   Dr Alice Reichert  . WISDOM TOOTH EXTRACTION      There were no vitals filed for this visit.  Subjective Assessment - 05/21/19 0719    Subjective  Pt. reports 4/10 R shoulder pain/ 1/10 L shoulder pain.  Pt. reports limited compliance with HEP.  Pt. is staying active with running and is planning to start riding a bike soon.    Pertinent History  See previous PMHx.  Pt. known well to PT clinic.      Limitations  Lifting;Standing;Writing;House hold activities    Patient Stated Goals  Increase R and L shoulder ROM/ strength to improve pain-free mobility.      Currently in Pain?  Yes    Pain Score  4     Pain Location  Shoulder    Pain Orientation  Right    Pain Descriptors / Indicators  Aching    Pain Onset  More than a month ago    Pain Score  1    Pain Location  Shoulder    Pain Orientation  Left    Pain Descriptors / Indicators  Aching;Sore    Pain Onset  More than a  month ago         Manual tx.:  Supine L/R shoulder capsular mobility (grade II-III Mobs.)- AP/PA/inf. (good mobility all planes) STM to L UT region and cervical UT/levator stretches 3x each L shoulder AA/PROM 10x all planes of movement.  Prone grade II-III PA unilateral mobs. To T4-8 (1x30 seconds)  There.ex.:  Prone L/R shoulder extension AROM/ manual isometric feedback (cuing for proper technique) Reviewed HEP    PT Long Term Goals - 05/21/19 0720      PT LONG TERM GOAL #1   Title  Pt. I with HEP to increase L shoulder AROM to [facility] as compared to R shoulder to improve pain-free mobility.      Baseline  L shoulder AROM in supine position: flexion 138 deg./ abduction 128 deg./ IR 72 deg./ ER 32 (pain).  On 7/8:  L/R shoulder AROM: supine flexion (152 deg.), seated (121 deg.). R supine flexion (168 deg.), seated (162 deg.). Supine abduction: L 102 deg./ R 136 deg., ER L (30 deg.), R (77 deg.), IR L (86 deg.), R (88  deg.). R shoulder strength grossly 4/5 MMT with limited effort/ pain reported. L shoulder strength grossly 4-/5 MMT (limited effort/ guarded movement).    Time  4    Period  Weeks    Status  Not Met    Target Date  06/18/19      PT LONG TERM GOAL #2   Title  Pt. will increase L deltoid/ biceps/ triceps strength to grossly 4/5 MMT to improve return to household tasks/ running.     Baseline  L shoulder strength grossly 3+/5 MMT, biceps/triceps 4/5 MMT.  On 7/8: L shoulder strength: grossly 4-/5 MMT (limited effort/ guarded movement).    Time  4    Period  Weeks    Status  Not Met    Target Date  06/18/19      PT LONG TERM GOAL #3   Title  Pt. will increase FOTO to 68 to improve R shoulder mobility.      Baseline  FOTO: baseline 46/ goal 68.  On 7/8: will reassess next visit    Time  4    Period  Weeks    Status  On-going    Target Date  06/18/19      PT LONG TERM GOAL #4   Title  Pt. able to complete all work related tasks with no shoulder limitations or  pain to promote return to PLOF.      Baseline  Pt. has not returned to work at this time due to [employer] restrictions.     Time  4    Period  Weeks    Status  On-going    Target Date  06/18/19            Plan - 05/21/19 0719    Clinical Impression Statement  Pt. has been working with PT over past 4 weeks with increasing L shoulder AROM and progressing shoulder stability.  Pt. has shown slow progress with increasing L shoulder AROM/ strength.  Poor compliance and effort with strengthening based ex. noted during tx. session.  Pt. reports increase L/R shoulder pain with repeated movement pattern/ strength training.  L/R shoulder AROM: supine flexion (152 deg.), seated (121 deg.).  R supine flexion (168 deg.), seated (162 deg.).  Supine abduction: L 102 deg./ R 136 deg., ER L (30 deg.), R (77 deg.), IR L (86 deg.), R (88 deg.).  R shoulder strength grossly 4/5 MMT with limited effort/ pain reported.  L shoulder strength grossly 4-/5 MMT (limited effort/ guarded movement).  PT educated pt. on importance of a compliant exercise program to increase strength/ promote functional mobility.    Personal Factors and Comorbidities  Age;Fitness    Stability/Clinical Decision Making  Stable/Uncomplicated    Clinical Decision Making  Moderate    Rehab Potential  Good    PT Frequency  2x / week    PT Duration  4 weeks    PT Treatment/Interventions  ADLs/Self Care Home Management;Aquatic Therapy;Cryotherapy;Electrical Stimulation;Therapeutic exercise;Patient/family education;Neuromuscular re-education;Manual techniques;Scar mobilization;Passive range of motion;Functional mobility training    PT Next Visit Plan  Progress L shoulder AROM (follow MD protocol).   Strength training/ reassess HEP and compliance.  Discuss MD f/u appt.    PT Home Exercise Plan  See handouts       Patient will benefit from skilled therapeutic intervention in order to improve the following deficits and impairments:  Pain, Decreased  mobility, Decreased scar mobility, Hypomobility, Decreased strength, Decreased range of motion, Decreased endurance, Decreased activity tolerance, Impaired flexibility  Visit Diagnosis: 1. Shoulder joint stiffness, left   2. Chronic left shoulder pain   3. Muscle weakness (generalized)   4. Shoulder joint stiffness, right   5. Acute pain of right shoulder        Problem List Patient Active Problem List   Diagnosis Date Noted  . Generalized abdominal pain 06/19/2018  . Nausea and vomiting 06/19/2018  . Loss of weight 06/19/2018  . Headache 12/18/2012  . Hypothermia 12/18/2012   Pura Spice, PT, DPT # [phone] 05/21/2019, 9:12 AM  [place] [facility] [address] [place], [place], [address] Phone: [phone]   Fax:  [phone]  Name: Sara Gomez MRN: [id number] Date of Birth: 05-13-1996

## 2019-05-26 ENCOUNTER — Other Ambulatory Visit: Payer: Self-pay

## 2019-05-26 ENCOUNTER — Ambulatory Visit: Payer: BC Managed Care – PPO | Admitting: Physical Therapy

## 2019-05-26 ENCOUNTER — Encounter: Payer: Self-pay | Admitting: Physical Therapy

## 2019-05-26 DIAGNOSIS — M25512 Pain in left shoulder: Secondary | ICD-10-CM

## 2019-05-26 DIAGNOSIS — M25611 Stiffness of right shoulder, not elsewhere classified: Secondary | ICD-10-CM

## 2019-05-26 DIAGNOSIS — M25612 Stiffness of left shoulder, not elsewhere classified: Secondary | ICD-10-CM

## 2019-05-26 DIAGNOSIS — M6281 Muscle weakness (generalized): Secondary | ICD-10-CM

## 2019-05-26 DIAGNOSIS — G8929 Other chronic pain: Secondary | ICD-10-CM

## 2019-05-26 NOTE — Therapy (Signed)
[place] [facility] [address] [place], [place], [address] Phone: [phone]   Fax:  [phone]  Physical Therapy Treatment  Patient Details  Name: Sara Gomez MRN: [id number] Date of Birth: Sep 04, 1996 Referring Provider (PT): Dr. Jeannie Fend   Encounter Date: 05/26/2019  PT End of Session - 05/26/19 0844    Visit Number  20    Number of Visits  27    Date for PT Re-Evaluation  06/18/19    PT Start Time  0721    PT Stop Time  0812    PT Time Calculation (min)  51 min    Activity Tolerance  Patient tolerated treatment well;Patient limited by pain    Behavior During Therapy  [facility] for tasks assessed/performed       Past Medical History:  Diagnosis Date  . Abdominal pain 05/23/2018  . Nausea and vomiting 05/23/2018   Per [facility] Hepato w Eject Fract order    Past Surgical History:  Procedure Laterality Date  . COLONOSCOPY  06/07/2018   Dr Alice Reichert  . SHOULDER ARTHROSCOPY     x2  . SHOULDER LATERJET     x3  . TONSILLECTOMY  2005  . UPPER GI ENDOSCOPY  06/07/2018   Dr Alice Reichert  . WISDOM TOOTH EXTRACTION      There were no vitals filed for this visit.  Subjective Assessment - 05/26/19 0721    Subjective  Pt. reports MD was happy with progress and pt. returns in December.  Pt. reports she has a HA this morning and c/o GI issues.  No shoulder pain at this time.    Pertinent History  See previous PMHx.  Pt. known well to PT clinic.      Limitations  Lifting;Standing;Writing;House hold activities    Patient Stated Goals  Increase R and L shoulder ROM/ strength to improve pain-free mobility.      Currently in Pain?  No/denies    Pain Onset  More than a month ago    Pain Onset  More than a month ago        Manual tx.:  Prone STM to L UT region and supine cervical UT/levator stretches 3x each with static holds Prone grade II-III PA unilateral mobs. To T4-8 (2x20 seconds) L shoulder AA/PROM 10x all planes of  movement  There.ex.:  Supine wt. Wand: press-ups/ tricep extension/ sh. Flexion (assist)/ sh. Abduction (assist)- 10x2 Prone L/R shoulder extension/ scap. Retractoin/ modified flexion 10x.  Supine L sh. AROM (all planes)/ manual rhythmic stabs 3x30 sec.   Reviewed strengthening ex. Training.       PT Long Term Goals - 05/21/19 0720      PT LONG TERM GOAL #1   Title  Pt. I with HEP to increase L shoulder AROM to [facility] as compared to R shoulder to improve pain-free mobility.      Baseline  L shoulder AROM in supine position: flexion 138 deg./ abduction 128 deg./ IR 72 deg./ ER 32 (pain).  On 7/8:  L/R shoulder AROM: supine flexion (152 deg.), seated (121 deg.). R supine flexion (168 deg.), seated (162 deg.). Supine abduction: L 102 deg./ R 136 deg., ER L (30 deg.), R (77 deg.), IR L (86 deg.), R (88 deg.). R shoulder strength grossly 4/5 MMT with limited effort/ pain reported. L shoulder strength grossly 4-/5 MMT (limited effort/ guarded movement).    Time  4    Period  Weeks    Status  Not Met  Target Date  06/18/19      PT LONG TERM GOAL #2   Title  Pt. will increase L deltoid/ biceps/ triceps strength to grossly 4/5 MMT to improve return to household tasks/ running.     Baseline  L shoulder strength grossly 3+/5 MMT, biceps/triceps 4/5 MMT.  On 7/8: L shoulder strength: grossly 4-/5 MMT (limited effort/ guarded movement).    Time  4    Period  Weeks    Status  Not Met    Target Date  06/18/19      PT LONG TERM GOAL #3   Title  Pt. will increase FOTO to 68 to improve R shoulder mobility.      Baseline  FOTO: baseline 46/ goal 68.  On 7/8: will reassess next visit    Time  4    Period  Weeks    Status  On-going    Target Date  06/18/19      PT LONG TERM GOAL #4   Title  Pt. able to complete all work related tasks with no shoulder limitations or pain to promote return to PLOF.      Baseline  Pt. has not returned to work at this time due to [employer] restrictions.     Time  4     Period  Weeks    Status  On-going    Target Date  06/18/19         Plan - 05/26/19 0845    Clinical Impression Statement  PT wants to focus on L shoulder stabilization but pt. states she is having a HA/ GI issues.  Pt. agreed to work on supine L shoulder stabilization/ resisted ex.  Good L shoulder AROM with supine wt. wand press-ups/ tricep extension/ sh. flexion.  Pt. limited with ER/ abduction due to moderate c/o muscle fatigue.  No increase c/o L shoulder pain after tx. session and pt. able to demonstrate correction with posture.    Personal Factors and Comorbidities  Age;Fitness    Stability/Clinical Decision Making  Stable/Uncomplicated    Clinical Decision Making  Moderate    Rehab Potential  Good    PT Frequency  2x / week    PT Duration  4 weeks    PT Treatment/Interventions  ADLs/Self Care Home Management;Aquatic Therapy;Cryotherapy;Electrical Stimulation;Therapeutic exercise;Patient/family education;Neuromuscular re-education;Manual techniques;Scar mobilization;Passive range of motion;Functional mobility training    PT Next Visit Plan  Progress L shoulder AROM (follow MD protocol).   Strength training/ reassess HEP and compliance.    PT Home Exercise Plan  See handouts       Patient will benefit from skilled therapeutic intervention in order to improve the following deficits and impairments:  Pain, Decreased mobility, Decreased scar mobility, Hypomobility, Decreased strength, Decreased range of motion, Decreased endurance, Decreased activity tolerance, Impaired flexibility  Visit Diagnosis: 1. Shoulder joint stiffness, left   2. Chronic left shoulder pain   3. Muscle weakness (generalized)   4. Shoulder joint stiffness, right        Problem List Patient Active Problem List   Diagnosis Date Noted  . Generalized abdominal pain 06/19/2018  . Nausea and vomiting 06/19/2018  . Loss of weight 06/19/2018  . Headache 12/18/2012  . Hypothermia 12/18/2012   Pura Spice, PT, DPT # [phone] 05/26/2019, 8:53 AM  [place] [facility] [address] [place], [place], [address] Phone: [phone]   Fax:  [phone]  Name: Sara Gomez MRN: [id number] Date of Birth: 1996/08/06

## 2019-05-28 ENCOUNTER — Ambulatory Visit: Payer: BC Managed Care – PPO | Admitting: Physical Therapy

## 2019-05-28 ENCOUNTER — Encounter: Payer: Self-pay | Admitting: Physical Therapy

## 2019-05-28 ENCOUNTER — Other Ambulatory Visit: Payer: Self-pay

## 2019-05-28 DIAGNOSIS — M25612 Stiffness of left shoulder, not elsewhere classified: Secondary | ICD-10-CM | POA: Diagnosis not present

## 2019-05-28 DIAGNOSIS — M25512 Pain in left shoulder: Secondary | ICD-10-CM

## 2019-05-28 DIAGNOSIS — M25611 Stiffness of right shoulder, not elsewhere classified: Secondary | ICD-10-CM

## 2019-05-28 DIAGNOSIS — M6281 Muscle weakness (generalized): Secondary | ICD-10-CM

## 2019-05-28 DIAGNOSIS — G8929 Other chronic pain: Secondary | ICD-10-CM

## 2019-05-28 NOTE — Therapy (Signed)
[place] [facility] [address] [place], [place], [address] Phone: [phone]   Fax:  [phone]  Physical Therapy Treatment  Patient Details  Name: Sara Gomez MRN: [id number] Date of Birth: 11/06/1996 Referring Provider (PT): Dr. Jeannie Fend   Encounter Date: 05/28/2019  PT End of Session - 05/28/19 0807    Visit Number  21    Number of Visits  27    Date for PT Re-Evaluation  06/18/19    PT Start Time  0724    PT Stop Time  0806    PT Time Calculation (min)  42 min    Activity Tolerance  Patient tolerated treatment well;Patient limited by pain    Behavior During Therapy  [facility] for tasks assessed/performed       Past Medical History:  Diagnosis Date  . Abdominal pain 05/23/2018  . Nausea and vomiting 05/23/2018   Per NM Hepato w Eject Fract order    Past Surgical History:  Procedure Laterality Date  . COLONOSCOPY  06/07/2018   Dr Alice Reichert  . SHOULDER ARTHROSCOPY     x2  . SHOULDER LATERJET     x3  . TONSILLECTOMY  2005  . UPPER GI ENDOSCOPY  06/07/2018   Dr Alice Reichert  . WISDOM TOOTH EXTRACTION      There were no vitals filed for this visit.  Subjective Assessment - 05/28/19 0726    Subjective  Pt reports she ran yesterday and that she now has pain in hip of 3/10 today. Pt reports she did not sleep well.    Pertinent History  See previous PMHx.  Pt. known well to PT clinic.      Limitations  Lifting;Standing;Writing;House hold activities    Patient Stated Goals  Increase R and L shoulder ROM/ strength to improve pain-free mobility.      Currently in Pain?  Yes    Pain Score  3     Pain Location  Hip    Pain Onset  More than a month ago    Pain Onset  More than a month ago       Therapeutic exercise:  Nautilus pull-down: 20# x15 Nautilus tricep extension - 20# x15, 10#x20. Cuing provided for technique. Nautilus shoulder adduction - 10# x20 Nautilus rows - 20# x15. Cuing for technique Suitcase carries with 8lb  dumbbell on L. Pt reports shoulder feels unstable Bicep curls 2# dumbbells - x10 B. Pt reports popping on L. Serratus punches x10 2# on R, bodyweight on L x10   Manual therapy:  Supine, STM to L bicep and anterior shoulder x 3 min, x5 min  Prone, STM to paravertebrals throughout thoracic and lumbar spine  Supine, PROM L shoulder flexion, abduction, IR, ER  X 4 min Prone, central PAs garde II-III C7-L1 x30 sec    PT Long Term Goals - 05/21/19 0720      PT LONG TERM GOAL #1   Title  Pt. I with HEP to increase L shoulder AROM to [facility] as compared to R shoulder to improve pain-free mobility.      Baseline  L shoulder AROM in supine position: flexion 138 deg./ abduction 128 deg./ IR 72 deg./ ER 32 (pain).  On 7/8:  L/R shoulder AROM: supine flexion (152 deg.), seated (121 deg.). R supine flexion (168 deg.), seated (162 deg.). Supine abduction: L 102 deg./ R 136 deg., ER L (30 deg.), R (77 deg.), IR L (86 deg.), R (88 deg.). R shoulder strength grossly 4/5  MMT with limited effort/ pain reported. L shoulder strength grossly 4-/5 MMT (limited effort/ guarded movement).    Time  4    Period  Weeks    Status  Not Met    Target Date  06/18/19      PT LONG TERM GOAL #2   Title  Pt. will increase L deltoid/ biceps/ triceps strength to grossly 4/5 MMT to improve return to household tasks/ running.     Baseline  L shoulder strength grossly 3+/5 MMT, biceps/triceps 4/5 MMT.  On 7/8: L shoulder strength: grossly 4-/5 MMT (limited effort/ guarded movement).    Time  4    Period  Weeks    Status  Not Met    Target Date  06/18/19      PT LONG TERM GOAL #3   Title  Pt. will increase FOTO to 68 to improve R shoulder mobility.      Baseline  FOTO: baseline 46/ goal 68.  On 7/8: will reassess next visit    Time  4    Period  Weeks    Status  On-going    Target Date  06/18/19      PT LONG TERM GOAL #4   Title  Pt. able to complete all work related tasks with no shoulder limitations or pain to promote  return to PLOF.      Baseline  Pt. has not returned to work at this time due to [employer] restrictions.     Time  4    Period  Weeks    Status  On-going    Target Date  06/18/19         Plan - 05/28/19 0735    Clinical Impression Statement  Pt with increased upper trap activation with standing Nautilus exercises requiring frequent cuing for technique. Pt with increased fatigue with suitcase carry exercise on L UE, indicating decreased strenght of L shoulder musculature. Pt has increased muscle stiffness of L shoulder. Pt has difficulty with remaining compliant with HEP and during therapy sessions. Pt will continue to benefit from further skilled therapy to increase B shoulder ROM and improve functional mobility.    Personal Factors and Comorbidities  Age;Fitness    Stability/Clinical Decision Making  Stable/Uncomplicated    Rehab Potential  Good    PT Frequency  2x / week    PT Duration  4 weeks    PT Treatment/Interventions  ADLs/Self Care Home Management;Aquatic Therapy;Cryotherapy;Electrical Stimulation;Therapeutic exercise;Patient/family education;Neuromuscular re-education;Manual techniques;Scar mobilization;Passive range of motion;Functional mobility training    PT Next Visit Plan  Progress L shoulder AROM (follow MD protocol).   Strength training/ reassess HEP and compliance.    PT Home Exercise Plan  See handouts    Consulted and Agree with Plan of Care  Patient       Patient will benefit from skilled therapeutic intervention in order to improve the following deficits and impairments:  Pain, Decreased mobility, Decreased scar mobility, Hypomobility, Decreased strength, Decreased range of motion, Decreased endurance, Decreased activity tolerance, Impaired flexibility  Visit Diagnosis: 1. Shoulder joint stiffness, left   2. Chronic left shoulder pain   3. Muscle weakness (generalized)   4. Shoulder joint stiffness, right        Problem List Patient Active Problem List    Diagnosis Date Noted  . Generalized abdominal pain 06/19/2018  . Nausea and vomiting 06/19/2018  . Loss of weight 06/19/2018  . Headache 12/18/2012  . Hypothermia 12/18/2012   Pura Spice, PT, DPT #  [place], SPT 05/28/2019, 12:21 PM  [place] [facility] [address] [place], [place], [address] Phone: [phone]   Fax:  [phone]  Name: Camara Renstrom Gruenberg MRN: [id number] Date of Birth: May 06, 1996

## 2019-06-02 ENCOUNTER — Encounter: Payer: BC Managed Care – PPO | Admitting: Physical Therapy

## 2019-06-04 ENCOUNTER — Other Ambulatory Visit: Payer: Self-pay | Admitting: Pediatrics

## 2019-06-04 ENCOUNTER — Ambulatory Visit: Payer: BC Managed Care – PPO | Admitting: Physical Therapy

## 2019-06-04 DIAGNOSIS — Z20822 Contact with and (suspected) exposure to covid-19: Secondary | ICD-10-CM

## 2019-06-08 LAB — NOVEL CORONAVIRUS, NAA: SARS-CoV-2, NAA: NOT DETECTED

## 2019-06-09 ENCOUNTER — Encounter: Payer: Self-pay | Admitting: Physical Therapy

## 2019-06-09 ENCOUNTER — Other Ambulatory Visit: Payer: Self-pay

## 2019-06-09 ENCOUNTER — Ambulatory Visit: Payer: BC Managed Care – PPO | Admitting: Physical Therapy

## 2019-06-09 DIAGNOSIS — M25612 Stiffness of left shoulder, not elsewhere classified: Secondary | ICD-10-CM

## 2019-06-09 DIAGNOSIS — M25611 Stiffness of right shoulder, not elsewhere classified: Secondary | ICD-10-CM

## 2019-06-09 DIAGNOSIS — G8929 Other chronic pain: Secondary | ICD-10-CM

## 2019-06-09 DIAGNOSIS — M6281 Muscle weakness (generalized): Secondary | ICD-10-CM

## 2019-06-09 NOTE — Therapy (Signed)
[place] [facility] [address] [place], [place], [address] Phone: [phone]   Fax:  [phone]  Physical Therapy Treatment  Patient Details  Name: Sara Gomez MRN: [id number] Date of Birth: July 07, 1996 Referring Provider (PT): Dr. Jeannie Fend   Encounter Date: 06/09/2019  PT End of Session - 06/09/19 0715    Visit Number  22    Number of Visits  27    Date for PT Re-Evaluation  06/18/19    PT Start Time  0729    PT Stop Time  0819    PT Time Calculation (min)  50 min    Activity Tolerance  Patient tolerated treatment well;Patient limited by pain    Behavior During Therapy  [facility] for tasks assessed/performed       Past Medical History:  Diagnosis Date  . Abdominal pain 05/23/2018  . Nausea and vomiting 05/23/2018   Per NM Hepato w Eject Fract order    Past Surgical History:  Procedure Laterality Date  . COLONOSCOPY  06/07/2018   Dr Alice Reichert  . SHOULDER ARTHROSCOPY     x2  . SHOULDER LATERJET     x3  . TONSILLECTOMY  2005  . UPPER GI ENDOSCOPY  06/07/2018   Dr Alice Reichert  . WISDOM TOOTH EXTRACTION      There were no vitals filed for this visit.  Subjective Assessment - 06/09/19 0713    Subjective  Pt. states she is feeling much better and tested negative for Covid test.  Pt. reports she is scheduled to return to school next week.  Pt. reports straining L low back while bending forward to brush teeth this past weekend.    Pertinent History  See previous PMHx.  Pt. known well to PT clinic.      Limitations  Lifting;Standing;Writing;House hold activities    Patient Stated Goals  Increase R and L shoulder ROM/ strength to improve pain-free mobility.      Currently in Pain?  Yes    Pain Score  5     Pain Location  Back    Pain Orientation  Left;Lower    Pain Descriptors / Indicators  Aching;Discomfort    Pain Type  Acute pain    Pain Onset  More than a month ago    Pain Onset  More than a month ago         Therapeutic  exercise:  Supine 2# bicep curls/ tricep extension/ serratus punches/ sh. IR/ ER 20x.  Limited ER to neutral.  Nautilus pull-down: 30# x20 Nautilus shoulder adduction: 20# x20 Nautilus rows - 20# x20. Cuing for technique to prevent compensatory movements. Supine L shoulder stretches (all planes)- static holds as tolerated.   Supine shoulder flexion with yellow ball/ standing shoulder flexion with rebounder 15x.   Standing shoulder AROM (all planes).    PT assessed low back in prone position: (+) L lumbar paraspinal tenderness/ no muscle tightness noted.       PT Long Term Goals - 05/21/19 0720      PT LONG TERM GOAL #1   Title  Pt. I with HEP to increase L shoulder AROM to [facility] as compared to R shoulder to improve pain-free mobility.      Baseline  L shoulder AROM in supine position: flexion 138 deg./ abduction 128 deg./ IR 72 deg./ ER 32 (pain).  On 7/8:  L/R shoulder AROM: supine flexion (152 deg.), seated (121 deg.). R supine flexion (168 deg.), seated (162 deg.). Supine abduction: L  102 deg./ R 136 deg., ER L (30 deg.), R (77 deg.), IR L (86 deg.), R (88 deg.). R shoulder strength grossly 4/5 MMT with limited effort/ pain reported. L shoulder strength grossly 4-/5 MMT (limited effort/ guarded movement).    Time  4    Period  Weeks    Status  Not Met    Target Date  06/18/19      PT LONG TERM GOAL #2   Title  Pt. will increase L deltoid/ biceps/ triceps strength to grossly 4/5 MMT to improve return to household tasks/ running.     Baseline  L shoulder strength grossly 3+/5 MMT, biceps/triceps 4/5 MMT.  On 7/8: L shoulder strength: grossly 4-/5 MMT (limited effort/ guarded movement).    Time  4    Period  Weeks    Status  Not Met    Target Date  06/18/19      PT LONG TERM GOAL #3   Title  Pt. will increase FOTO to 68 to improve R shoulder mobility.      Baseline  FOTO: baseline 46/ goal 68.  On 7/8: will reassess next visit    Time  4    Period  Weeks    Status  On-going     Target Date  06/18/19      PT LONG TERM GOAL #4   Title  Pt. able to complete all work related tasks with no shoulder limitations or pain to promote return to PLOF.      Baseline  Pt. has not returned to work at this time due to [employer] restrictions.     Time  4    Period  Weeks    Status  On-going    Target Date  06/18/19            Plan - 06/09/19 0715    Clinical Impression Statement  Pt. did really well with L shoulder focal rhythmic stabilizations, esp. with flexion/ abduction.  Pt. demonstrates ability to toss yellow ball with B UE/ shoulder flexion overhead.  Difficulty with catching ball due to increase L low back discomfort/ soreness.  PT recommended gentle stretches/ icing to low back to decrease muscle tenderness/ inflammation.    Personal Factors and Comorbidities  Age;Fitness    Stability/Clinical Decision Making  Stable/Uncomplicated    Clinical Decision Making  Moderate    Rehab Potential  Good    PT Frequency  2x / week    PT Duration  4 weeks    PT Treatment/Interventions  ADLs/Self Care Home Management;Aquatic Therapy;Cryotherapy;Electrical Stimulation;Therapeutic exercise;Patient/family education;Neuromuscular re-education;Manual techniques;Scar mobilization;Passive range of motion;Functional mobility training    PT Next Visit Plan  Progress L shoulder AROM (follow MD protocol).   Strength training    PT Home Exercise Plan  See handouts    Consulted and Agree with Plan of Care  Patient       Patient will benefit from skilled therapeutic intervention in order to improve the following deficits and impairments:  Pain, Decreased mobility, Decreased scar mobility, Hypomobility, Decreased strength, Decreased range of motion, Decreased endurance, Decreased activity tolerance, Impaired flexibility  Visit Diagnosis: 1. Shoulder joint stiffness, left   2. Chronic left shoulder pain   3. Muscle weakness (generalized)   4. Shoulder joint stiffness, right         Problem List Patient Active Problem List   Diagnosis Date Noted  . Generalized abdominal pain 06/19/2018  . Nausea and vomiting 06/19/2018  . Loss of weight 06/19/2018  .  Headache 12/18/2012  . Hypothermia 12/18/2012   Pura Spice, PT, DPT # [id number] 06/09/2019, 9:37 AM  [place] [facility] [address] [place], [place], [address] Phone: [phone]   Fax:  [phone]  Name: Lunetta Marina Bunton MRN: [id number] Date of Birth: 1996/04/26

## 2019-06-11 ENCOUNTER — Encounter: Payer: Self-pay | Admitting: Physical Therapy

## 2019-06-11 ENCOUNTER — Other Ambulatory Visit: Payer: Self-pay

## 2019-06-11 ENCOUNTER — Ambulatory Visit: Payer: BC Managed Care – PPO | Admitting: Physical Therapy

## 2019-06-11 DIAGNOSIS — M6281 Muscle weakness (generalized): Secondary | ICD-10-CM

## 2019-06-11 DIAGNOSIS — M25612 Stiffness of left shoulder, not elsewhere classified: Secondary | ICD-10-CM | POA: Diagnosis not present

## 2019-06-11 DIAGNOSIS — G8929 Other chronic pain: Secondary | ICD-10-CM

## 2019-06-11 DIAGNOSIS — M25611 Stiffness of right shoulder, not elsewhere classified: Secondary | ICD-10-CM

## 2019-06-11 NOTE — Therapy (Signed)
[facility] [address] [place], [place], [address] Phone: [phone]   Fax:  [phone]  Physical Therapy Treatment  Patient Details  Name: Sara Gomez MRN: [id number] Date of Birth: 06-13-1996 Referring Provider (PT): Dr. Jeannie Fend   Encounter Date: 06/11/2019  PT End of Session - 06/11/19 0713    Visit Number  23    Number of Visits  27    Date for PT Re-Evaluation  06/18/19    PT Start Time  0727    PT Stop Time  0821    PT Time Calculation (min)  54 min    Activity Tolerance  Patient tolerated treatment well;Patient limited by pain    Behavior During Therapy  [facility] for tasks assessed/performed       Past Medical History:  Diagnosis Date  . Abdominal pain 05/23/2018  . Nausea and vomiting 05/23/2018   Per NM Hepato w Eject Fract order    Past Surgical History:  Procedure Laterality Date  . COLONOSCOPY  06/07/2018   Dr Alice Reichert  . SHOULDER ARTHROSCOPY     x2  . SHOULDER LATERJET     x3  . TONSILLECTOMY  2005  . UPPER GI ENDOSCOPY  06/07/2018   Dr Alice Reichert  . WISDOM TOOTH EXTRACTION      There were no vitals filed for this visit.  Subjective Assessment - 06/11/19 0712    Subjective  Pt. states she was able to complete prone planking yesterday with minimal shoulder issues.  Pt. still running on a consistent basis with no c/o shoulder pain.  No shoulder pain at this time but pt. states she has a HA.    Pertinent History  See previous PMHx.  Pt. known well to PT clinic.      Limitations  Lifting;Standing;Writing;House hold activities    Patient Stated Goals  Increase R and L shoulder ROM/ strength to improve pain-free mobility.      Currently in Pain?  No/denies    Pain Onset  More than a month ago    Pain Onset  More than a month ago         Therapeutic exercise:  Standing 2# bicep curls/ shoulder ER/ punches/ 90 deg. Shoulder abduction 20x.  Supine 2# tricep extension/ serratus punches/ sh. flexion  20x.   R sidelying L shoulder ER to neutral position with no wt. And PT cuing for proper technique.    Supine L/R shoulder stretches (all planes)- static holds as tolerated.   Supine B shoulder flexion 10x (as tolerated)  Standing shoulder AROM (all planes)- 3 min.   Supine STM to L anterior deltoid/ prox. Biceps with massage cream.   PT Long Term Goals - 05/21/19 0720      PT LONG TERM GOAL #1   Title  Pt. I with HEP to increase L shoulder AROM to [facility] as compared to R shoulder to improve pain-free mobility.      Baseline  L shoulder AROM in supine position: flexion 138 deg./ abduction 128 deg./ IR 72 deg./ ER 32 (pain).  On 7/8:  L/R shoulder AROM: supine flexion (152 deg.), seated (121 deg.). R supine flexion (168 deg.), seated (162 deg.). Supine abduction: L 102 deg./ R 136 deg., ER L (30 deg.), R (77 deg.), IR L (86 deg.), R (88 deg.). R shoulder strength grossly 4/5 MMT with limited effort/ pain reported. L shoulder strength grossly 4-/5 MMT (limited effort/ guarded movement).    Time  4  Period  Weeks    Status  Not Met    Target Date  06/18/19      PT LONG TERM GOAL #2   Title  Pt. will increase L deltoid/ biceps/ triceps strength to grossly 4/5 MMT to improve return to household tasks/ running.     Baseline  L shoulder strength grossly 3+/5 MMT, biceps/triceps 4/5 MMT.  On 7/8: L shoulder strength: grossly 4-/5 MMT (limited effort/ guarded movement).    Time  4    Period  Weeks    Status  Not Met    Target Date  06/18/19      PT LONG TERM GOAL #3   Title  Pt. will increase FOTO to 68 to improve R shoulder mobility.      Baseline  FOTO: baseline 46/ goal 68.  On 7/8: will reassess next visit    Time  4    Period  Weeks    Status  On-going    Target Date  06/18/19      PT LONG TERM GOAL #4   Title  Pt. able to complete all work related tasks with no shoulder limitations or pain to promote return to PLOF.      Baseline  Pt. has not returned to work at this time due to [employer]  restrictions.     Time  4    Period  Weeks    Status  On-going    Target Date  06/18/19            Plan - 06/11/19 0713    Clinical Impression Statement  Pt. able to complete 2# B shoulder/ bicep/ tricep ther.ex. with no increase c/o pain.  Pt. fatigues quickly and need moderate cuing to complete ex.  L shoulder flexion/ ER AROM remains limited in all positions.  Good R shoulder capsule stability noted during R shoulder AROM/ 2# ther.ex.  No change to HEP and pt. instructed to remains active and use B shoulder in pool this upcoming weekend.    Personal Factors and Comorbidities  Age;Fitness    Stability/Clinical Decision Making  Stable/Uncomplicated    Clinical Decision Making  Moderate    Rehab Potential  Good    PT Frequency  2x / week    PT Duration  4 weeks    PT Treatment/Interventions  ADLs/Self Care Home Management;Aquatic Therapy;Cryotherapy;Electrical Stimulation;Therapeutic exercise;Patient/family education;Neuromuscular re-education;Manual techniques;Scar mobilization;Passive range of motion;Functional mobility training    PT Next Visit Plan  Progress L shoulder AROM (follow MD protocol).   Strength training    PT Home Exercise Plan  See handouts    Consulted and Agree with Plan of Care  Patient       Patient will benefit from skilled therapeutic intervention in order to improve the following deficits and impairments:  Pain, Decreased mobility, Decreased scar mobility, Hypomobility, Decreased strength, Decreased range of motion, Decreased endurance, Decreased activity tolerance, Impaired flexibility  Visit Diagnosis: 1. Shoulder joint stiffness, left   2. Chronic left shoulder pain   3. Muscle weakness (generalized)   4. Shoulder joint stiffness, right        Problem List Patient Active Problem List   Diagnosis Date Noted  . Generalized abdominal pain 06/19/2018  . Nausea and vomiting 06/19/2018  . Loss of weight 06/19/2018  . Headache 12/18/2012  .  Hypothermia 12/18/2012   Pura Spice, PT, DPT # [phone] 06/11/2019, 9:28 AM  [facility] [address] [place], [place],  [address] Phone: [phone]   Fax:  [phone]  Name: Sara Gomez MRN: [id number] Date of Birth: [birth year]

## 2019-06-16 ENCOUNTER — Encounter: Payer: BC Managed Care – PPO | Admitting: Physical Therapy

## 2019-06-17 ENCOUNTER — Ambulatory Visit: Payer: BC Managed Care – PPO | Admitting: Physical Therapy

## 2019-06-18 ENCOUNTER — Other Ambulatory Visit: Payer: Self-pay

## 2019-06-18 ENCOUNTER — Ambulatory Visit: Payer: BC Managed Care – PPO | Attending: Orthopedic Surgery | Admitting: Physical Therapy

## 2019-06-18 ENCOUNTER — Encounter: Payer: BC Managed Care – PPO | Admitting: Physical Therapy

## 2019-06-18 DIAGNOSIS — M25612 Stiffness of left shoulder, not elsewhere classified: Secondary | ICD-10-CM | POA: Insufficient documentation

## 2019-06-18 DIAGNOSIS — M25611 Stiffness of right shoulder, not elsewhere classified: Secondary | ICD-10-CM | POA: Diagnosis present

## 2019-06-18 DIAGNOSIS — M25512 Pain in left shoulder: Secondary | ICD-10-CM | POA: Diagnosis present

## 2019-06-18 DIAGNOSIS — G8929 Other chronic pain: Secondary | ICD-10-CM | POA: Diagnosis present

## 2019-06-18 DIAGNOSIS — M6281 Muscle weakness (generalized): Secondary | ICD-10-CM | POA: Diagnosis present

## 2019-06-19 ENCOUNTER — Encounter: Payer: Self-pay | Admitting: Physical Therapy

## 2019-06-19 ENCOUNTER — Ambulatory Visit: Payer: BC Managed Care – PPO | Admitting: Physical Therapy

## 2019-06-19 NOTE — Therapy (Signed)
[facility] [address] [place], [place], [address] Phone: [phone]   Fax:  [phone]  Physical Therapy Treatment  Patient Details  Name: Sara Gomez MRN: [id number] Date of Birth: 03/13/1996 Referring Provider (PT): Dr. Jeannie Fend   Encounter Date: 06/18/2019  PT End of Session - 06/19/19 0845    Visit Number  24    Number of Visits  27    Date for PT Re-Evaluation  06/18/19    PT Start Time  0726    PT Stop Time  0821    PT Time Calculation (min)  55 min    Activity Tolerance  Patient tolerated treatment well    Behavior During Therapy  [facility] for tasks assessed/performed       Past Medical History:  Diagnosis Date  . Abdominal pain 05/23/2018  . Nausea and vomiting 05/23/2018   Per NM Hepato w Eject Fract order    Past Surgical History:  Procedure Laterality Date  . COLONOSCOPY  06/07/2018   Dr Alice Reichert  . SHOULDER ARTHROSCOPY     x2  . SHOULDER LATERJET     x3  . TONSILLECTOMY  2005  . UPPER GI ENDOSCOPY  06/07/2018   Dr Alice Reichert  . WISDOM TOOTH EXTRACTION      There were no vitals filed for this visit.  Subjective Assessment - 06/19/19 0811    Subjective  Pt. states she returns to college this weekend.  Pt. understands the importance of staying compliant with HEP.  No c/o shoulder pain at this time but L ankle hurts.    Pertinent History  See previous PMHx.  Pt. known well to PT clinic.      Limitations  Lifting;Standing;Writing;House hold activities    Patient Stated Goals  Increase R and L shoulder ROM/ strength to improve pain-free mobility.      Currently in Pain?  Yes    Pain Score  3     Pain Location  Ankle    Pain Orientation  Left    Pain Descriptors / Indicators  Aching    Pain Type  Acute pain    Pain Onset  More than a month ago    Pain Onset  More than a month ago        Therapeutic exercise:  Standing 2# bicep curls/ shoulder ER/ punches/ 90 deg. Shoulder abduction 20x.  Supine 2#  tricep extension/ serratus punches/ sh. flexion 20x.   Supine L/R shoulder stretches (all planes)- static holds as tolerated.  Supine B shoulder flexion 10x (as tolerated) Standing shoulder AROM (all planes)- 3 min.  Nautilus: 30# lat. Pull downs in standing 25x/ 20# tricep extension 18x.      PT Long Term Goals - 05/21/19 0720      PT LONG TERM GOAL #1   Title  Pt. I with HEP to increase L shoulder AROM to [facility] as compared to R shoulder to improve pain-free mobility.      Baseline  L shoulder AROM in supine position: flexion 138 deg./ abduction 128 deg./ IR 72 deg./ ER 32 (pain).  On 7/8:  L/R shoulder AROM: supine flexion (152 deg.), seated (121 deg.). R supine flexion (168 deg.), seated (162 deg.). Supine abduction: L 102 deg./ R 136 deg., ER L (30 deg.), R (77 deg.), IR L (86 deg.), R (88 deg.). R shoulder strength grossly 4/5 MMT with limited effort/ pain reported. L shoulder strength grossly 4-/5 MMT (limited effort/ guarded movement).  Time  4    Period  Weeks    Status  Not Met    Target Date  06/18/19      PT LONG TERM GOAL #2   Title  Pt. will increase L deltoid/ biceps/ triceps strength to grossly 4/5 MMT to improve return to household tasks/ running.     Baseline  L shoulder strength grossly 3+/5 MMT, biceps/triceps 4/5 MMT.  On 7/8: L shoulder strength: grossly 4-/5 MMT (limited effort/ guarded movement).    Time  4    Period  Weeks    Status  Not Met    Target Date  06/18/19      PT LONG TERM GOAL #3   Title  Pt. will increase FOTO to 68 to improve R shoulder mobility.      Baseline  FOTO: baseline 46/ goal 68.  On 7/8: will reassess next visit    Time  4    Period  Weeks    Status  On-going    Target Date  06/18/19      PT LONG TERM GOAL #4   Title  Pt. able to complete all work related tasks with no shoulder limitations or pain to promote return to PLOF.      Baseline  Pt. has not returned to work at this time due to [employer] restrictions.     Time  4     Period  Weeks    Status  On-going    Target Date  06/18/19         Plan - 06/19/19 0845    Clinical Impression Statement  L ankle presents as a grade I ankle sprain with minimal swelling noted and discomfort with resisted L ankle IV/EV.  Pt. remains limited to 2# resistance with L UE strengthening.  Pt. hesitant to progress strengthening secondary to pain/ fear of increase pain.  Discharge from PT next visit with issuing of progressive HEP.    Personal Factors and Comorbidities  Age;Fitness    Stability/Clinical Decision Making  Stable/Uncomplicated    Rehab Potential  Good    PT Frequency  2x / week    PT Duration  4 weeks    PT Treatment/Interventions  ADLs/Self Care Home Management;Aquatic Therapy;Cryotherapy;Electrical Stimulation;Therapeutic exercise;Patient/family education;Neuromuscular re-education;Manual techniques;Scar mobilization;Passive range of motion;Functional mobility training    PT Next Visit Plan  Progress L shoulder AROM (follow MD protocol).   ISSUE NEW PROGRESSIVE HEP.  DISCHARGE NEXT TX SESSION    PT Home Exercise Plan  See handouts    Consulted and Agree with Plan of Care  Patient       Patient will benefit from skilled therapeutic intervention in order to improve the following deficits and impairments:  Pain, Decreased mobility, Decreased scar mobility, Hypomobility, Decreased strength, Decreased range of motion, Decreased endurance, Decreased activity tolerance, Impaired flexibility  Visit Diagnosis: 1. Shoulder joint stiffness, left   2. Chronic left shoulder pain   3. Muscle weakness (generalized)        Problem List Patient Active Problem List   Diagnosis Date Noted  . Generalized abdominal pain 06/19/2018  . Nausea and vomiting 06/19/2018  . Loss of weight 06/19/2018  . Headache 12/18/2012  . Hypothermia 12/18/2012   Pura Spice, PT, DPT # [phone] 06/19/2019, 8:54 AM  [facility] [address] [place], [place], [address] Phone: [phone]   Fax:  [phone]  Name: Sara Gomez MRN: [id number] Date of Birth: 09/13/1996

## 2019-06-20 ENCOUNTER — Ambulatory Visit: Payer: BC Managed Care – PPO | Admitting: Physical Therapy

## 2019-06-20 ENCOUNTER — Other Ambulatory Visit: Payer: Self-pay

## 2019-06-20 DIAGNOSIS — M6281 Muscle weakness (generalized): Secondary | ICD-10-CM

## 2019-06-20 DIAGNOSIS — M25611 Stiffness of right shoulder, not elsewhere classified: Secondary | ICD-10-CM

## 2019-06-20 DIAGNOSIS — G8929 Other chronic pain: Secondary | ICD-10-CM

## 2019-06-20 DIAGNOSIS — M25512 Pain in left shoulder: Secondary | ICD-10-CM

## 2019-06-20 DIAGNOSIS — M25612 Stiffness of left shoulder, not elsewhere classified: Secondary | ICD-10-CM

## 2019-06-21 NOTE — Therapy (Addendum)
[facility] [facility] [address] [place], [place], [address] Phone: [phone]   Fax:  [phone]  Physical Therapy Treatment  Patient Details  Name: Sara Gomez MRN: [id number] Date of Birth: [birth year] Referring Provider (PT): Dr. Jeannie Fend   Encounter Date: 06/20/2019  PT End of Session - 07/21/19 1347    Visit Number  25    Number of Visits  25    Date for PT Re-Evaluation  06/18/19    PT Start Time  0725    PT Stop Time  0822    PT Time Calculation (min)  57 min    Activity Tolerance  Patient tolerated treatment well    Behavior During Therapy  [facility] for tasks assessed/performed       Past Medical History:  Diagnosis Date  . Abdominal pain 05/23/2018  . Nausea and vomiting 05/23/2018   Per NM Hepato w Eject Fract order    Past Surgical History:  Procedure Laterality Date  . COLONOSCOPY  06/07/2018   Dr Alice Reichert  . SHOULDER ARTHROSCOPY     x2  . SHOULDER LATERJET     x3  . TONSILLECTOMY  2005  . UPPER GI ENDOSCOPY  06/07/2018   Dr Alice Reichert  . WISDOM TOOTH EXTRACTION      There were no vitals filed for this visit.  Subjective Assessment - 07/21/19 1342    Subjective  Pt. returning to college/[employer] this weekend.  Pt. states she will continue with HEP.  No shoulder pain reported prior to PT tx. session but pt. is concerned with R shoulder.    Pertinent History  See previous PMHx.  Pt. known well to PT clinic.      Limitations  Lifting;Standing;Writing;House hold activities    Patient Stated Goals  Increase R and L shoulder ROM/ strength to improve pain-free mobility.      Currently in Pain?  No/denies    Pain Onset  More than a month ago    Pain Onset  More than a month ago         [facility] PT Assessment - 07/21/19 0001      Assessment   Medical Diagnosis  s/p L [place] procedure/ ICBG, R shoulder pain.      Referring Provider (PT)  Dr. Jeannie Fend    Onset Date/Surgical Date  12/10/18    Hand Dominance  Right     Prior Therapy  yes      Prior Function   Level of Independence  Independent       Manual:  Supine L shoulder AA/PROM all planes.  Reassessment of AROM. STM to L UT/anterior and posterior deltoid/ proximal biceps 7 min.  There.ex.:  See HEP (handouts issued and emailed to pt.) Issued RTB and GTB for HEP     PT Education - 07/21/19 1344    Education provided  Yes    Education Details  Reviewed HEP in depth    Person(s) Educated  Patient    Methods  Explanation;Demonstration;Handout    Comprehension  Verbalized understanding;Returned demonstration          PT Long Term Goals - 07/21/19 1401      PT LONG TERM GOAL #1   Title  Pt. I with HEP to increase L shoulder AROM to [facility] as compared to R shoulder to improve pain-free mobility.      Baseline  L shoulder AROM in supine position: flexion (152 deg.), seated (121 deg.). R supine flexion (168 deg.), seated (162  deg.). Supine abduction: L 106 deg./ R 136 deg., ER L (32 deg.), R (78 deg.), IR L (86 deg.), R (88 deg.). R shoulder strength grossly 4/5 MMT with limited effort/ pain reported. L shoulder strength grossly 4-/5 MMT (limited effort/ guarded movement).    Time  4    Period  Weeks    Target Date  06/20/19      PT LONG TERM GOAL #2   Title  Pt. will increase L deltoid/ biceps/ triceps strength to grossly 4/5 MMT to improve return to household tasks/ running.     Baseline  L shoulder strength: grossly 4-/5 MMT (limited effort/ guarded movement).    Time  4    Period  Weeks    Status  Partially Met    Target Date  06/20/19      PT LONG TERM GOAL #3   Title  Pt. will increase FOTO to 68 to improve R shoulder mobility.      Baseline  FOTO: baseline 46/ goal 68.  On 7/8: will reassess next visit    Time  4    Period  Weeks    Status  On-going    Target Date  06/20/19      PT LONG TERM GOAL #4   Title  Pt. able to complete all work related tasks with no shoulder limitations or pain to promote return to PLOF.       Baseline  Pt. has not returned to work at this time due to [employer] restrictions. 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 has shown steady progress with L shoulder ROM/ strengthening in a pain-free range but continues to have limitations with shoulder abduction/ ER.  PT has emphasized the importance of completing HEP on a daily basis to increase ROM/ shoulder stability.  Pt. understands HEP at this time and has been issued progressive resistance bands to continue on an independent basis at this time.  See updated goals on recert/discharge.  Pt. instructed to continue with R shoulder stability ex. to prevent discomfort/improve functional mobility.    Personal Factors and Comorbidities  Age;Fitness    Stability/Clinical Decision Making  Stable/Uncomplicated    Clinical Decision Making  Low    Rehab Potential  Good    PT Frequency  2x / week    PT Duration  4 weeks    PT Treatment/Interventions  ADLs/Self Care Home Management;Aquatic Therapy;Cryotherapy;Electrical Stimulation;Therapeutic exercise;Patient/family education;Neuromuscular re-education;Manual techniques;Scar mobilization;Passive range of motion;Functional mobility training    PT Next Visit Plan  Discharge.  PT emailed new progressive HEP    PT Home Exercise Plan  See handouts    Consulted and Agree with Plan of Care  Patient       Patient will benefit from skilled therapeutic intervention in order to improve the following deficits and impairments:  Pain, Decreased mobility, Decreased scar mobility, Hypomobility, Decreased strength, Decreased range of motion, Decreased endurance, Decreased activity tolerance, Impaired flexibility  Visit Diagnosis: 1. Shoulder joint stiffness, left   2. Chronic left shoulder pain   3. Muscle weakness (generalized)   4. Shoulder joint stiffness, right         Problem List Patient Active Problem List   Diagnosis Date Noted  .  Generalized abdominal pain 06/19/2018  . Nausea and vomiting 06/19/2018  . Loss of weight 06/19/2018  . Headache 12/18/2012  . Hypothermia 12/18/2012   Pura Spice, PT, DPT # [phone] 07/21/2019, 2:20 PM  [place] [facility] [address] [place], [place], [address] Phone: [phone]   Fax:  [phone]  Name: Jennilyn Esteve Fauver MRN: [id number] Date of Birth: [birth year]

## 2019-07-21 NOTE — Addendum Note (Signed)
Addended by: Pura Spice on: 07/21/2019 02:23 PM   Modules accepted: Orders

## 2020-01-18 IMAGING — NM NM HEPATO W/GB/PHARM/[PERSON_NAME]
3 series · 13 of 13 positions shown · non-contrast
Comparison: None.

CLINICAL DATA: Nausea and vomiting with abdominal pain

EXAM:
NUCLEAR MEDICINE HEPATOBILIARY IMAGING WITH GALLBLADDER EF
TECHNIQUE: Views anterior right upper quadrant
RADIOPHARMACEUTICALS:  5.4 mCi Kc-JJm  Choletec IV

[Series 1000: gallbladder delays · 3.30mm/px · 1 of 1 slices shown]
[im 1/1]
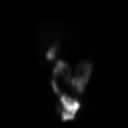

[Series 1000: hepatobiliary scan · 9.59mm/px · 6 of 60 frames shown]
[frame 6/60]
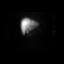
[frame 16/60]
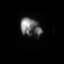
[frame 26/60]
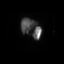
[frame 36/60]
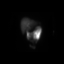
[frame 46/60]
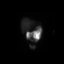
[frame 56/60]
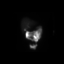

[Series 1000: gallbladder ef · 4.80mm/px · 6 of 120 frames shown]
[frame 11/120]
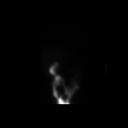
[frame 31/120]
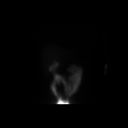
[frame 51/120]
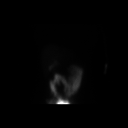
[frame 71/120]
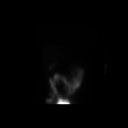
[frame 91/120]
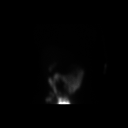
[frame 111/120]
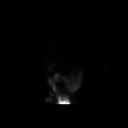

[13 of 13 positions shown; findings below may reference images not displayed]

FINDINGS: Liver uptake of radiotracer is normal. There is prompt visualization
of gallbladder and small bowel, indicating patency of the cystic and
common bile ducts.

The patient consumed 8 ounces of Ensure orally with calculation of
the computer generated ejection fraction of radiotracer from the
gallbladder. The patient did have nausea and cramping with the oral
Ensure consumption. The computer generated ejection fraction of
radiotracer from the gallbladder is normal at 88%, normal greater
than 33% using the oral agent.
IMPRESSION: Normal ejection fraction of radiotracer from the gallbladder. The
patient did experience clinical symptoms with the oral Ensure
consumption. There is patency of the cystic and common bile ducts as
is evidenced by visualization of gallbladder and small bowel.

## 2021-04-27 ENCOUNTER — Other Ambulatory Visit: Payer: Self-pay

## 2021-04-27 ENCOUNTER — Ambulatory Visit: Payer: 59 | Attending: Orthopedic Surgery | Admitting: Physical Therapy

## 2021-04-27 DIAGNOSIS — M25611 Stiffness of right shoulder, not elsewhere classified: Secondary | ICD-10-CM | POA: Insufficient documentation

## 2021-04-27 DIAGNOSIS — M25612 Stiffness of left shoulder, not elsewhere classified: Secondary | ICD-10-CM | POA: Insufficient documentation

## 2021-04-27 DIAGNOSIS — G8929 Other chronic pain: Secondary | ICD-10-CM | POA: Diagnosis present

## 2021-04-27 DIAGNOSIS — M25511 Pain in right shoulder: Secondary | ICD-10-CM | POA: Diagnosis present

## 2021-04-27 DIAGNOSIS — M25512 Pain in left shoulder: Secondary | ICD-10-CM | POA: Diagnosis present

## 2021-04-27 DIAGNOSIS — M6281 Muscle weakness (generalized): Secondary | ICD-10-CM | POA: Diagnosis present

## 2021-04-30 ENCOUNTER — Encounter: Payer: Self-pay | Admitting: Physical Therapy

## 2021-04-30 NOTE — Therapy (Signed)
[facility] [address] [place], [place], [address] Phone: [phone]   Fax:  [phone]  Physical Therapy Evaluation  Patient Details  Name: Sara Gomez MRN: [id number] Date of Birth: 08/27/1996 Referring Provider (PT): Dr. Roney Mans   Encounter Date: 04/27/2021   PT End of Session - 04/30/21 0805     Visit Number 1    Number of Visits 17    Date for PT Re-Evaluation 06/22/21    Authorization - Visit Number 1    Authorization - Number of Visits 10    PT Start Time 0731    PT Stop Time 0824    PT Time Calculation (min) 53 min    Activity Tolerance Patient tolerated treatment well    Behavior During Therapy [facility] for tasks assessed/performed             Past Medical History:  Diagnosis Date   Abdominal pain 05/23/2018   Nausea and vomiting 05/23/2018   Per NM Hepato w [facility]inf[facility] Burn order    Past Surgical History:  Procedure Laterality Date   COLONOSCOPY  06/07/2018   Dr Norma Fredrickson   SHOULDER ARTHROSCOPY     x2   SHOULDER LATERJET     x3   TONSILLECTOMY  2005   UPPER GI ENDOSCOPY  06/07/2018   Dr Norma Fredrickson   WISDOM TOOTH EXTRACTION      There were no vitals filed for this visit.    Subjective Assessment - 04/30/21 0757     Subjective Pt. s/p R shoulder surgery (see MD order/protocol) on 04/07/2021.  Pt. states she is currently sleeping on the couch and reports 5/10 R shoulder pain.  Pt. entered PT with proper use of shoulder sling.  Pt. known well to PT clinic due to multiple shoulder surgeries in past.    Pertinent History See previous PMHx.  Pt. known well to PT clinic.      Limitations Lifting;Standing;Writing;House hold activities    Patient Stated Goals Increase R shoulder ROM/ strength to improve pain-free mobility.    Currently in Pain? Yes    Pain Score 5     Pain Location Shoulder    Pain Orientation Right    Pain Type Surgical pain    Pain Onset 1 to 4 weeks ago    Pain Frequency Intermittent     Pain Onset More than a month ago                [facility] PT Assessment - 04/30/21 0001       Assessment   Medical Diagnosis s/p R shoulder surgery/ joint stiffness    Referring Provider (PT) Dr. Roney Mans    Onset Date/Surgical Date 04/07/21    Hand Dominance Right    Prior Therapy Yes, known well to PT clinic.      Precautions   Precautions Shoulder    Type of Shoulder Precautions see MD protocol      Restrictions   Weight Bearing Restrictions No      Balance Screen   Has the patient fallen in the past 6 months Yes      Home Environment   Living Environment Private residence      Prior Function   Level of Independence Independent      Cognition   Overall Cognitive Status Within Functional Limits for tasks assessed             See flowsheet     Objective measurements completed on examination: See above  findings.    See HEP     PT Education - 04/30/21 0804     Education provided Yes    Education Details Pt. instructed in shoulder limitations per MD protocol.  Pt. has h/o noncompliance and PT emphasized the importance of consistent program.    Person(s) Educated Patient    Methods Explanation;Demonstration    Comprehension Verbalized understanding;Returned demonstration                 PT Long Term Goals - 04/30/21 0810       PT LONG TERM GOAL #1   Title Pt. I with HEP to increase R shoulder AROM to [facility] as compared to L shoulder to improve pain-free mobility.    Baseline L shoulder AROM in supine position: flexion (152 deg.), seated (121 deg.). R supine flexion (168 deg.), seated (162 deg.). Supine abduction: L 106 deg./ R 136 deg., ER L (32 deg.), R (78 deg.), IR L (86 deg.), R (88 deg.). R shoulder strength grossly 4/5 MMT with limited effort/ pain reported. L shoulder strength grossly 4-/5 MMT (limited effort/ guarded movement).    Time 8    Period Weeks    Status New    Target Date 06/22/21      PT LONG TERM GOAL #2   Title Pt. will  increase R deltoid/ biceps/ triceps strength to grossly 4/5 MMT to improve return to household tasks/ running.    Baseline No R shoulder MMT testing per MD protocol    Time 8    Period Weeks    Status New    Target Date 06/22/21      PT LONG TERM GOAL #3   Title Pt. will increase FOTO to 60 to improve R shoulder mobility.    Baseline FOTO: baseline 29    Time 8    Period Weeks    Status New    Target Date 06/22/21      PT LONG TERM GOAL #4   Title Pt. able to complete all work related tasks with no R shoulder limitations or pain to promote return to PLOF.    Baseline Pt. has not returned to work at this time due to R shoulder limitations.    Time 8    Period Weeks    Status New    Target Date 06/22/21                    Plan - 04/30/21 0806     Clinical Impression Statement Pt. is a pleasant 25 y/o female s/p R shoulder procedure on 04/07/21.  Pt. known well to PT secondary to multiple L and R sh. shoulder issues/surgeries.  Pt. arrived to PT with use of sling/ MD protocol.  Pt. not sleeping well at night and reports 5/10 R shoulder pain at rest.  PT explained MD protocol and importance of compliance with HEP to improve R shoulder recovery.  Supine L shoulder AROM WFL except ER (limited).  R shoulder AAROM: flexion 90 deg./ abduction 45 deg./ ER 1 deg.  R elbow/wrist AROM WNL.  FOTO: baseline 29/ goal 60.  Pt. presents with good incision healing with mild tenderness with palpation around distal aspect of incision.  Pt. will benefit from skilled PT services to increase R shoulder AROM/ strength to improve functional mobility.    Personal Factors and Comorbidities Age;Fitness    Stability/Clinical Decision Making Stable/Uncomplicated    Clinical Decision Making Low    Rehab Potential Good  PT Frequency 2x / week    PT Duration 8 weeks    PT Treatment/Interventions ADLs/Self Care Home Management;Aquatic Therapy;Cryotherapy;Electrical Stimulation;Therapeutic  exercise;Patient/family education;Neuromuscular re-education;Manual techniques;Scar mobilization;Passive range of motion;Functional mobility training    PT Next Visit Plan Follow MD protocol.    PT Home Exercise Plan See handouts    Consulted and Agree with Plan of Care Patient             Patient will benefit from skilled therapeutic intervention in order to improve the following deficits and impairments:  Pain, Decreased mobility, Decreased scar mobility, Hypomobility, Decreased strength, Decreased range of motion, Decreased endurance, Decreased activity tolerance, Impaired flexibility  Visit Diagnosis: Shoulder joint stiffness, right  Muscle weakness (generalized)  Acute pain of right shoulder     Problem List Patient Active Problem List   Diagnosis Date Noted   Generalized abdominal pain 06/19/2018   Nausea and vomiting 06/19/2018   Loss of weight 06/19/2018   Headache 12/18/2012   Hypothermia 12/18/2012   [name], PT, DPT # [phone] 04/30/2021, 8:14 AM  [facility] [address] [place], [place], [address] Phone: [phone]   Fax:  [phone]  Name: Sara Gomez MRN: [id number] Date of Birth: [birth year]

## 2021-05-03 ENCOUNTER — Encounter: Payer: Self-pay | Admitting: Physical Therapy

## 2021-05-03 ENCOUNTER — Ambulatory Visit: Payer: 59 | Admitting: Physical Therapy

## 2021-05-03 ENCOUNTER — Other Ambulatory Visit: Payer: Self-pay

## 2021-05-03 DIAGNOSIS — M6281 Muscle weakness (generalized): Secondary | ICD-10-CM

## 2021-05-03 DIAGNOSIS — M25511 Pain in right shoulder: Secondary | ICD-10-CM

## 2021-05-03 DIAGNOSIS — G8929 Other chronic pain: Secondary | ICD-10-CM

## 2021-05-03 DIAGNOSIS — M25612 Stiffness of left shoulder, not elsewhere classified: Secondary | ICD-10-CM

## 2021-05-03 DIAGNOSIS — M25611 Stiffness of right shoulder, not elsewhere classified: Secondary | ICD-10-CM | POA: Diagnosis not present

## 2021-05-03 NOTE — Therapy (Signed)
[facility] [address] [place], [place], [address] Phone: [phone]   Fax:  [phone]  Physical Therapy Treatment  Patient Details  Name: Sara Gomez MRN: [id number] Date of Birth: September 27, 1996 Referring Provider (PT): Dr. Roney Mans   Encounter Date: 05/03/2021   PT End of Session - 05/03/21 0838     Visit Number 2    Number of Visits 17    Date for PT Re-Evaluation 06/22/21    Authorization - Visit Number 2    Authorization - Number of Visits 10    PT Start Time 0733    PT Stop Time 0819    PT Time Calculation (min) 46 min    Activity Tolerance Patient tolerated treatment well    Behavior During Therapy [facility] for tasks assessed/performed             Past Medical History:  Diagnosis Date   Abdominal pain 05/23/2018   Nausea and vomiting 05/23/2018   Per NM Hepato w [facility] Burn order    Past Surgical History:  Procedure Laterality Date   COLONOSCOPY  06/07/2018   Dr Norma Fredrickson   SHOULDER ARTHROSCOPY     x2   SHOULDER LATERJET     x3   TONSILLECTOMY  2005   UPPER GI ENDOSCOPY  06/07/2018   Dr Norma Fredrickson   WISDOM TOOTH EXTRACTION      There were no vitals filed for this visit.   Subjective Assessment - 05/03/21 0837     Subjective Pt. states R shoulder/ deltoid pain has been "bad" over weekend.  Pt. had to take pain medication due to increase in R shoulder pain and pt. states she does not want to take pain pills.  Pt. reports good compliance with sling but not HEP.  PT instructed pt on the importance of R shoulder AAROM per MD protocol.    Pertinent History See previous PMHx.  Pt. known well to PT clinic.      Limitations Lifting;Standing;Writing;House hold activities    Patient Stated Goals Increase R shoulder ROM/ strength to improve pain-free mobility.    Currently in Pain? Yes    Pain Score 6     Pain Location Shoulder    Pain Orientation Right    Pain Descriptors / Indicators Aching;Constant    Pain  Type Surgical pain    Pain Onset 1 to 4 weeks ago    Pain Onset More than a month ago              Manual tx.:  Supine R shoulder AA/PROM flexion to 90 deg./ abduction 45 deg./ ER 0 deg. 10x2 each with static holds.    STM to R shoulder/ proximal biceps and triceps/ R UT musculature.  No lotion over incision at this time.     There.ex.:  Reviewed HEP (pt. Reports noncompliance).    Supine wand ex.: AAROM press-ups/ shoulder flexion (PT assist) 10x2.    Ice to R shoulder in seated position after tx. Session.      PT Long Term Goals - 05/03/21 0908       PT LONG TERM GOAL #1   Title Pt. I with HEP to increase R shoulder AROM to [facility] as compared to L shoulder to improve pain-free mobility.    Baseline R sh. limited to 90 deg. flexion/ 45 deg. abduction/ 1 deg. ER per MD protocol.    Time 8    Period Weeks    Status New  Target Date 06/22/21      PT LONG TERM GOAL #2   Title Pt. will increase R deltoid/ biceps/ triceps strength to grossly 4/5 MMT to improve return to household tasks/ running.    Baseline No R shoulder MMT testing per MD protocol    Time 8    Period Weeks    Status New    Target Date 06/22/21      PT LONG TERM GOAL #3   Title Pt. will increase FOTO to 60 to improve R shoulder mobility.    Baseline FOTO: baseline 29    Time 8    Period Weeks    Status New    Target Date 06/22/21      PT LONG TERM GOAL #4   Title Pt. able to complete all work related tasks with no R shoulder limitations or pain to promote return to PLOF.    Baseline Pt. has not returned to work at this time due to R shoulder limitations.    Time 8    Period Weeks    Status New    Target Date 06/22/21                   Plan - 05/03/21 0839     Clinical Impression Statement Pt. presents to PT with increase c/o R shoulder/ middle deltoid pain at rest.  R shoulder AAROM flexion 90 deg./ abduction 45 deg. (pain) and ER remains limited to 0 deg. (pt. pain limited/  guarded with any ER).  Pts. incision is healing well with surgical glue and scabs in place.  Pt. has good R elbow flexion/ extension/ supination in neutral UE position while in supine.  PT will issue HEP handouts next tx.    Personal Factors and Comorbidities Age;Fitness    Stability/Clinical Decision Making Stable/Uncomplicated    Clinical Decision Making Low    Rehab Potential Good    PT Frequency 2x / week    PT Duration 8 weeks    PT Treatment/Interventions ADLs/Self Care Home Management;Aquatic Therapy;Cryotherapy;Electrical Stimulation;Therapeutic exercise;Patient/family education;Neuromuscular re-education;Manual techniques;Scar mobilization;Passive range of motion;Functional mobility training    PT Next Visit Plan Follow MD protocol.  Pt. will be at 4 weeks s/p R sh. surgery next tx. session.  ISSUE HEP.    PT Home Exercise Plan See handouts    Consulted and Agree with Plan of Care Patient             Patient will benefit from skilled therapeutic intervention in order to improve the following deficits and impairments:  Pain, Decreased mobility, Decreased scar mobility, Hypomobility, Decreased strength, Decreased range of motion, Decreased endurance, Decreased activity tolerance, Impaired flexibility  Visit Diagnosis: Shoulder joint stiffness, right  Muscle weakness (generalized)  Acute pain of right shoulder  Shoulder joint stiffness, left  Chronic left shoulder pain     Problem List Patient Active Problem List   Diagnosis Date Noted   Generalized abdominal pain 06/19/2018   Nausea and vomiting 06/19/2018   Loss of weight 06/19/2018   Headache 12/18/2012   Hypothermia 12/18/2012   [name], PT, DPT # [phone] 05/03/2021, 9:11 AM  [facility] [address] [place], [place], [address] Phone: [phone]   Fax:  [phone]  Name: Sara Gomez MRN: [id number] Date of Birth: [birth year]

## 2021-05-05 ENCOUNTER — Encounter: Payer: Self-pay | Admitting: Physical Therapy

## 2021-05-05 ENCOUNTER — Other Ambulatory Visit: Payer: Self-pay

## 2021-05-05 ENCOUNTER — Ambulatory Visit: Payer: 59 | Admitting: Physical Therapy

## 2021-05-05 DIAGNOSIS — M6281 Muscle weakness (generalized): Secondary | ICD-10-CM

## 2021-05-05 DIAGNOSIS — M25511 Pain in right shoulder: Secondary | ICD-10-CM

## 2021-05-05 DIAGNOSIS — M25611 Stiffness of right shoulder, not elsewhere classified: Secondary | ICD-10-CM | POA: Diagnosis not present

## 2021-05-05 NOTE — Patient Instructions (Signed)
Access Code: [facility]: [email or web address]: 06/23/2022Prepared by: Casimiro Needle SherkExercises  Supine Shoulder External Rotation with Dowel - 1 x daily - 5 x weekly - 2 sets - 10 reps - 5s hold  Supine Shoulder Press with Dowel - 1 x daily - 5 x weekly - 2 sets - 10 reps  Supine Shoulder Abduction AAROM with Dowel - 1 x daily - 5 x weekly - 2 sets - 10 reps - 5s hold

## 2021-05-05 NOTE — Therapy (Signed)
[facility] [address] [place], [place], [address] Phone: [phone]   Fax:  [phone]  Physical Therapy Treatment  Patient Details  Name: Sara Gomez MRN: [id number] Date of Birth: 1996-05-23 Referring Provider (PT): Dr. Roney Mans   Encounter Date: 05/05/2021   PT End of Session - 05/05/21 0833     Visit Number 3    Number of Visits 17    Date for PT Re-Evaluation 06/22/21    Authorization - Visit Number 3    Authorization - Number of Visits 10    PT Start Time 0732    PT Stop Time 0827    PT Time Calculation (min) 55 min    Activity Tolerance Patient tolerated treatment well    Behavior During Therapy [facility] for tasks assessed/performed             Past Medical History:  Diagnosis Date   Abdominal pain 05/23/2018   Nausea and vomiting 05/23/2018   Per NM Hepato w [facility] Burn order    Past Surgical History:  Procedure Laterality Date   COLONOSCOPY  06/07/2018   Dr Norma Fredrickson   SHOULDER ARTHROSCOPY     x2   SHOULDER LATERJET     x3   TONSILLECTOMY  2005   UPPER GI ENDOSCOPY  06/07/2018   Dr Norma Fredrickson   WISDOM TOOTH EXTRACTION      There were no vitals filed for this visit.   Subjective Assessment - 05/05/21 0855     Subjective Pt. states she continues to have R shoulder pain at rest/ with sleeping.  Pt. compliant with use of sling and is 4 weeks s/p surgery today.  Pt. has not been doing HEP for shoulder but using elbow/ wrist actively.    Pertinent History See previous PMHx.  Pt. known well to PT clinic.      Limitations Lifting;Standing;Writing;House hold activities    Patient Stated Goals Increase R shoulder ROM/ strength to improve pain-free mobility.    Currently in Pain? Yes    Pain Score 6     Pain Location Shoulder    Pain Orientation Right    Pain Descriptors / Indicators Aching;Constant    Pain Onset 1 to 4 weeks ago    Pain Onset More than a month ago              Manual tx.:    Supine R shoulder AAROM flexion to 90 deg./ abduction 45 deg./ ER 0 deg. 10x2 each with static holds.     STM to R shoulder/ proximal biceps and triceps/ R UT musculature.  No lotion over incision at this time.  Several pieces of skin glue peeled off.  Scab healing well and intact.         There.ex.:   See HEP (pt. Self-reported noncompliant).     Supine wand ex.: AAROM press-ups/ shoulder flexion/ ER/ abduction (PT assist) 10x2.     Standing B shoulder wall on ball to 90 deg. 3x.     Ice to R shoulder in seated position after tx. Session.        PT Education - 05/05/21 0745     Education provided Yes    Education Details Access Code: [email or web address]    Person(s) Educated Patient    Methods Explanation;Demonstration;Verbal cues    Comprehension Verbalized understanding;Tactile cues required                 PT Long Term Goals - 05/03/21 7253  PT LONG TERM GOAL #1   Title Pt. I with HEP to increase R shoulder AROM to [facility] as compared to L shoulder to improve pain-free mobility.    Baseline R sh. limited to 90 deg. flexion/ 45 deg. abduction/ 1 deg. ER per MD protocol.    Time 8    Period Weeks    Status New    Target Date 06/22/21      PT LONG TERM GOAL #2   Title Pt. will increase R deltoid/ biceps/ triceps strength to grossly 4/5 MMT to improve return to household tasks/ running.    Baseline No R shoulder MMT testing per MD protocol    Time 8    Period Weeks    Status New    Target Date 06/22/21      PT LONG TERM GOAL #3   Title Pt. will increase FOTO to 60 to improve R shoulder mobility.    Baseline FOTO: baseline 29    Time 8    Period Weeks    Status New    Target Date 06/22/21      PT LONG TERM GOAL #4   Title Pt. able to complete all work related tasks with no R shoulder limitations or pain to promote return to PLOF.    Baseline Pt. has not returned to work at this time due to R shoulder limitations.    Time 8    Period Weeks    Status New     Target Date 06/22/21                   Plan - 05/05/21 0925     Clinical Impression Statement Pt. doing well with R shoulder flexion to 90 deg./ abduction to 45 deg. actively in supine position.  Pt. fatigues quickly with >10 reps and benefits from short rest breaks/ STM to R shoulder/ biceps musculature.  Pt. limited to 0 deg. of R shoulder ER with elbow at side.  PT issued a HEP that pt. states she will not complete and PT educated pt./ grandmother on the importance of R shoulder mvmt. per MD protocol.    Personal Factors and Comorbidities Age;Fitness    Stability/Clinical Decision Making Stable/Uncomplicated    Clinical Decision Making Low    Rehab Potential Good    PT Frequency 2x / week    PT Duration 8 weeks    PT Treatment/Interventions ADLs/Self Care Home Management;Aquatic Therapy;Cryotherapy;Electrical Stimulation;Therapeutic exercise;Patient/family education;Neuromuscular re-education;Manual techniques;Scar mobilization;Passive range of motion;Functional mobility training    PT Next Visit Plan Follow MD protocol.  Pt. currently in Phase I (4 weeks s/p surgery).    PT Home Exercise Plan See handouts    Consulted and Agree with Plan of Care Patient             Patient will benefit from skilled therapeutic intervention in order to improve the following deficits and impairments:  Pain, Decreased mobility, Decreased scar mobility, Hypomobility, Decreased strength, Decreased range of motion, Decreased endurance, Decreased activity tolerance, Impaired flexibility  Visit Diagnosis: Shoulder joint stiffness, right  Muscle weakness (generalized)  Acute pain of right shoulder     Problem List Patient Active Problem List   Diagnosis Date Noted   Generalized abdominal pain 06/19/2018   Nausea and vomiting 06/19/2018   Loss of weight 06/19/2018   Headache 12/18/2012   Hypothermia 12/18/2012   [name], PT, DPT # [phone] 05/05/2021, 9:33 AM  [facility] [address] [place], [place], [address] Phone: [phone]   Fax:  [phone]  Name: Sara Gomez MRN: [id number] Date of Birth: 1996/03/20

## 2021-05-09 ENCOUNTER — Ambulatory Visit: Payer: 59

## 2021-05-09 ENCOUNTER — Other Ambulatory Visit: Payer: Self-pay

## 2021-05-09 ENCOUNTER — Encounter: Payer: Self-pay | Admitting: Physical Therapy

## 2021-05-09 DIAGNOSIS — M25611 Stiffness of right shoulder, not elsewhere classified: Secondary | ICD-10-CM

## 2021-05-09 DIAGNOSIS — M6281 Muscle weakness (generalized): Secondary | ICD-10-CM

## 2021-05-09 DIAGNOSIS — M25511 Pain in right shoulder: Secondary | ICD-10-CM

## 2021-05-09 NOTE — Therapy (Signed)
[facility] [address] [place], [place], [address] Phone: [phone]   Fax:  [phone]  Physical Therapy Treatment  Patient Details  Name: Sara Gomez MRN: [id number] Date of Birth: 09-May-1996 Referring Provider (PT): Dr. Roney Mans   Encounter Date: 05/09/2021   PT End of Session - 05/09/21 0840     Visit Number 4    Number of Visits 17    Date for PT Re-Evaluation 06/22/21    Authorization Type BCBS COMM Pro (primary); U[employer] (secondary); visit limits based on necessity    Authorization Time Period 04/27/21- 06/22/21    Authorization - Visit Number 4    Authorization - Number of Visits 10    PT Start Time 0745    PT Stop Time 0829    PT Time Calculation (min) 44 min    Activity Tolerance Patient tolerated treatment well    Behavior During Therapy [facility] for tasks assessed/performed             Past Medical History:  Diagnosis Date   Abdominal pain 05/23/2018   Nausea and vomiting 05/23/2018   Per [facility] Hepato w [facility]infred Burn order    Past Surgical History:  Procedure Laterality Date   COLONOSCOPY  06/07/2018   Dr Norma Fredrickson   SHOULDER ARTHROSCOPY     x2   SHOULDER LATERJET     x3   TONSILLECTOMY  2005   UPPER GI ENDOSCOPY  06/07/2018   Dr Norma Fredrickson   WISDOM TOOTH EXTRACTION      There were no vitals filed for this visit.   Subjective Assessment - 05/09/21 0832     Subjective Pt reports persistent R shoulder pain resting and sleeping, hasn't been using ice for 3 days. Pt has only been using sling when sleeping but not in sitting while watching TV. Pt has been actively stretch R forearm in sitting.    Pertinent History Right shoulder recurrent instability now s/p anterior capsulorrhaphy c bone block 04/07/21. Pt seen at this clinic for rehab s/p similar surgery on Left shoulder.    Limitations Lifting;Standing;Writing;House hold activities    Patient Stated Goals Increase R shoulder ROM/ strength to improve pain-free  mobility.    Currently in Pain? Yes    Pain Score 5     Pain Location Shoulder    Pain Orientation Right    Pain Descriptors / Indicators Aching    Pain Type Surgical pain    Pain Onset 1 to 4 weeks ago             There.ex.:      Supine wand ex.: AAROM press-ups/ shoulder flexion/ ER/ abduction (PT assist) 10x2.     Standing finger ladder 7th in flexion, 4th in abduction. Visual cue with mirror and tactile cue required to correct R scapula elevation. x20 Manual:   Active resistance for R elbow concentric/eccentric flexion 3x10   Soft tissue mobilization to R biceps  HEP reviewed and educated on wearing the sling/ using ice for pain management.   Ice to R shoulder in seated position after tx. Session.         PT Long Term Goals - 05/03/21 0908       PT LONG TERM GOAL #1   Title Pt. I with HEP to increase R shoulder AROM to [facility] as compared to L shoulder to improve pain-free mobility.    Baseline R sh. limited to 90 deg. flexion/ 45 deg. abduction/ 1 deg. ER per MD protocol.  Time 8    Period Weeks    Status New    Target Date 06/22/21      PT LONG TERM GOAL #2   Title Pt. will increase R deltoid/ biceps/ triceps strength to grossly 4/5 MMT to improve return to household tasks/ running.    Baseline No R shoulder MMT testing per MD protocol    Time 8    Period Weeks    Status New    Target Date 06/22/21      PT LONG TERM GOAL #3   Title Pt. will increase FOTO to 60 to improve R shoulder mobility.    Baseline FOTO: baseline 29    Time 8    Period Weeks    Status New    Target Date 06/22/21      PT LONG TERM GOAL #4   Title Pt. able to complete all work related tasks with no R shoulder limitations or pain to promote return to PLOF.    Baseline Pt. has not returned to work at this time due to R shoulder limitations.    Time 8    Period Weeks    Status New    Target Date 06/22/21                   Plan - 05/09/21 0842     Clinical  Impression Statement Pt continues to demonstrate R active shoulder flexion to 90 degree, active assist abduction 45 degree in supine. Finger ladder performed to promote AROM against gravity. Pt is currently at 7th in flexion and 4th in abduction. Pt demonstrated improvement in PROM as evident by 5 degree increase with passive manual stretch. Pt tolerated session with slightly increase pain in R shoulder, resolved immediately with rest. R active ROM in all plane is still limited. Pt educated on wearing the sling with ADL except performing therapeutic exercises and keep using ice for pain management. Patient is progressing toward functional ROM and strength in R shoulder and continues need for skilled services.    Personal Factors and Comorbidities Age;Fitness    Stability/Clinical Decision Making Stable/Uncomplicated    Clinical Decision Making Low    Rehab Potential Good    PT Frequency 2x / week    PT Duration 8 weeks    PT Treatment/Interventions ADLs/Self Care Home Management;Aquatic Therapy;Cryotherapy;Electrical Stimulation;Therapeutic exercise;Patient/family education;Neuromuscular re-education;Manual techniques;Scar mobilization;Passive range of motion;Functional mobility training    PT Next Visit Plan Follow MD protocol.  Pt. currently in Phase I (4 weeks s/p surgery).    PT Home Exercise Plan See handouts    Consulted and Agree with Plan of Care Patient             Patient will benefit from skilled therapeutic intervention in order to improve the following deficits and impairments:  Pain, Decreased mobility, Decreased scar mobility, Hypomobility, Decreased strength, Decreased range of motion, Decreased endurance, Decreased activity tolerance, Impaired flexibility  Visit Diagnosis: Shoulder joint stiffness, right  Muscle weakness (generalized)  Acute pain of right shoulder     Problem List Patient Active Problem List   Diagnosis Date Noted   Generalized abdominal pain  06/19/2018   Nausea and vomiting 06/19/2018   Loss of weight 06/19/2018   Headache 12/18/2012   Hypothermia 12/18/2012    Jenny Reichmann 05/09/2021, 3:26 PM  [facility] [address] [place], [place], [address] Phone: [phone]   Fax:  [phone]  Name: Sara Gomez MRN: [id number] Date of  Birth: [birth year]

## 2021-05-11 ENCOUNTER — Other Ambulatory Visit: Payer: Self-pay

## 2021-05-11 ENCOUNTER — Ambulatory Visit: Payer: 59 | Admitting: Physical Therapy

## 2021-05-11 DIAGNOSIS — M6281 Muscle weakness (generalized): Secondary | ICD-10-CM

## 2021-05-11 DIAGNOSIS — M25511 Pain in right shoulder: Secondary | ICD-10-CM

## 2021-05-11 DIAGNOSIS — M25611 Stiffness of right shoulder, not elsewhere classified: Secondary | ICD-10-CM

## 2021-05-11 NOTE — Therapy (Signed)
[facility] [address] [place], [place], [address] Phone: [phone]   Fax:  [phone]  Physical Therapy Treatment  Patient Details  Name: Sara Gomez MRN: [id number] Date of Birth: [birth year] Referring Provider (PT): Dr. Roney Mans   Encounter Date: 05/11/2021   PT End of Session - 05/11/21 0918     Visit Number 5    Number of Visits 17    Date for PT Re-Evaluation 06/22/21    Authorization Type BCBS COMM Pro (primary); U[employer] (secondary); visit limits based on necessity    Authorization Time Period 04/27/21- 06/22/21    Authorization - Visit Number 4    Authorization - Number of Visits 10    PT Start Time 0849    PT Stop Time 0930    PT Time Calculation (min) 41 min    Activity Tolerance Patient tolerated treatment well    Behavior During Therapy [facility] for tasks assessed/performed             Past Medical History:  Diagnosis Date   Abdominal pain 05/23/2018   Nausea and vomiting 05/23/2018   Per [facility] Hepato w [facility] Burn order    Past Surgical History:  Procedure Laterality Date   COLONOSCOPY  06/07/2018   Dr Norma Fredrickson   SHOULDER ARTHROSCOPY     x2   SHOULDER LATERJET     x3   TONSILLECTOMY  2005   UPPER GI ENDOSCOPY  06/07/2018   Dr Norma Fredrickson   WISDOM TOOTH EXTRACTION      There were no vitals filed for this visit.   Subjective Assessment - 05/11/21 0912     Subjective Pt reports persistent R shoulder pain as she is attempting to start sleeping on her left side.She has not been icing except at PT, stating her mom refuses to hook up the ice machine. Pt only wearing sling when she is out of the house. She states she would like to start driving by next Friday.    Pertinent History Right shoulder recurrent instability now s/p anterior capsulorrhaphy c bone block 04/07/21. Pt seen at this clinic for rehab s/p similar surgery on Left shoulder.    Limitations Lifting;Standing;Writing;House hold activities    Patient  Stated Goals Increase R shoulder ROM/ strength to improve pain-free mobility.    Currently in Pain? No/denies    Pain Onset 1 to 4 weeks ago             There.ex.: Supine wand ex.: AAROM press-ups/ shoulder flexion/ ER/ abduction 2 x 10 Standing ball (small) R shoulder isometrics 2 x 30s hold 1) flexion, 2) extension, 3) ER Seated wand ex: AAROM shoulder flexion, abduction 2 x 10 (tactile cue required to correct R scapular elevation) HEP reviewed and educated on wearing the sling/ using ice for pain management.    Manual: STM to R biceps and brachialis x 5 min; STM to R infraspinatus, teres minor, rhomboids, mid-trap x 5 min;  Ice to R shoulder in seated position after tx. Session.      Pt educated throughout session about proper posture and technique with exercises. Improved exercise technique, movement at target joints, use of target muscles after min to mod verbal, visual, tactile cues.     Pt demonstrates R active shoulder flexion to 90 degrees, active assist abduction to 45 degrees in sitting. Pt tolerated session with slight increase in R shoulder pain during AAROM, resolved immediately with rest. R AROM in all planes continues to be limited. Session  was progressed with AAROM exercises performed in sitting and the addition of R GH isometric exercises holding a small 8" ball against the wall. Pt was educated on wearing the sling at all times except when performing therapeutic exercises - pt was not receptive. Pt was also educated on when the use of ice is appropriate regarding pain management. Other options for ice were recommended to pt after she states she has difficulty donning ice machine and family has not been helping. Patient is progressing toward functional ROM and strength in R shoulder and continues need for skilled services.         PT Long Term Goals - 05/03/21 0908       PT LONG TERM GOAL #1   Title Pt. I with HEP to increase R shoulder AROM to [facility] as  compared to L shoulder to improve pain-free mobility.    Baseline R sh. limited to 90 deg. flexion/ 45 deg. abduction/ 1 deg. ER per MD protocol.    Time 8    Period Weeks    Status New    Target Date 06/22/21      PT LONG TERM GOAL #2   Title Pt. will increase R deltoid/ biceps/ triceps strength to grossly 4/5 MMT to improve return to household tasks/ running.    Baseline No R shoulder MMT testing per MD protocol    Time 8    Period Weeks    Status New    Target Date 06/22/21      PT LONG TERM GOAL #3   Title Pt. will increase FOTO to 60 to improve R shoulder mobility.    Baseline FOTO: baseline 29    Time 8    Period Weeks    Status New    Target Date 06/22/21      PT LONG TERM GOAL #4   Title Pt. able to complete all work related tasks with no R shoulder limitations or pain to promote return to PLOF.    Baseline Pt. has not returned to work at this time due to R shoulder limitations.    Time 8    Period Weeks    Status New    Target Date 06/22/21                   Plan - 05/11/21 0919     Clinical Impression Statement ?Pt demonstrates R active shoulder flexion to 90 degrees, active assist abduction to 45 degrees in sitting. Pt tolerated session with slight increase in R shoulder pain during AAROM, resolved immediately with rest. R AROM in all planes continues to be limited. Session was progressed with AAROM exercises performed in sitting and the addition of R GH isometric exercises holding a small 8" ball against the wall. Pt was educated on wearing the sling at all times except when performing therapeutic exercises - pt was not receptive. Pt was also educated on when the use of ice is appropriate regarding pain management. Other options for ice were recommended to pt after she states she has difficulty donning ice machine and family has not been helping. Patient is progressing toward functional ROM and strength in R shoulder and continues need for skilled services.     Personal Factors and Comorbidities Age;Fitness    Stability/Clinical Decision Making Stable/Uncomplicated    Rehab Potential Good    PT Frequency 2x / week    PT Duration 8 weeks    PT Treatment/Interventions ADLs/Self Care Home Management;Aquatic Therapy;Cryotherapy;Electrical Stimulation;Therapeutic exercise;Patient/family education;Neuromuscular  re-education;Manual techniques;Scar mobilization;Passive range of motion;Functional mobility training    PT Next Visit Plan Follow MD protocol.  Pt. currently in Phase I (4 weeks s/p surgery).    PT Home Exercise Plan See handouts    Consulted and Agree with Plan of Care Patient             Patient will benefit from skilled therapeutic intervention in order to improve the following deficits and impairments:  Pain, Decreased mobility, Decreased scar mobility, Hypomobility, Decreased strength, Decreased range of motion, Decreased endurance, Decreased activity tolerance, Impaired flexibility  Visit Diagnosis: Shoulder joint stiffness, right  Muscle weakness (generalized)  Acute pain of right shoulder     Problem List Patient Active Problem List   Diagnosis Date Noted   Generalized abdominal pain 06/19/2018   Nausea and vomiting 06/19/2018   Loss of weight 06/19/2018   Headache 12/18/2012   Hypothermia 12/18/2012   Basilia Jumbo PT, DPT  Lavenia Atlas 05/11/2021, 12:54 PM  [facility] [address] [place], [place], [address] Phone: [phone]   Fax:  [phone]  Name: Latora Quarry Kook MRN: [id number] Date of Birth: Oct 14, 1996

## 2021-05-13 ENCOUNTER — Ambulatory Visit: Payer: 59 | Attending: Orthopedic Surgery | Admitting: Physical Therapy

## 2021-05-13 ENCOUNTER — Other Ambulatory Visit: Payer: Self-pay

## 2021-05-13 DIAGNOSIS — M25512 Pain in left shoulder: Secondary | ICD-10-CM | POA: Insufficient documentation

## 2021-05-13 DIAGNOSIS — G8929 Other chronic pain: Secondary | ICD-10-CM | POA: Diagnosis present

## 2021-05-13 DIAGNOSIS — M25611 Stiffness of right shoulder, not elsewhere classified: Secondary | ICD-10-CM

## 2021-05-13 DIAGNOSIS — M25511 Pain in right shoulder: Secondary | ICD-10-CM

## 2021-05-13 DIAGNOSIS — M6281 Muscle weakness (generalized): Secondary | ICD-10-CM | POA: Diagnosis present

## 2021-05-13 DIAGNOSIS — M25612 Stiffness of left shoulder, not elsewhere classified: Secondary | ICD-10-CM | POA: Diagnosis present

## 2021-05-13 NOTE — Therapy (Signed)
[facility] [address] [place], [place], [address] Phone: [phone]   Fax:  [phone]  Physical Therapy Treatment  Patient Details  Name: Sara Gomez MRN: [id number] Date of Birth: 07/27/1996 Referring Provider (PT): Dr. Roney Mans   Encounter Date: 05/13/2021   PT End of Session - 05/13/21 0828     Visit Number 6    Number of Visits 17    Date for PT Re-Evaluation 06/22/21    Authorization Type BCBS COMM Pro (primary); [employer] (secondary); visit limits based on necessity    Authorization Time Period 04/27/21- 06/22/21    Authorization - Visit Number 4    Authorization - Number of Visits 10    PT Start Time 0800    PT Stop Time 0845    PT Time Calculation (min) 45 min    Activity Tolerance Patient tolerated treatment well    Behavior During Therapy [facility] for tasks assessed/performed             Past Medical History:  Diagnosis Date   Abdominal pain 05/23/2018   Nausea and vomiting 05/23/2018   Per NM Hepato w [facility] Burn order    Past Surgical History:  Procedure Laterality Date   COLONOSCOPY  06/07/2018   Dr Norma Fredrickson   SHOULDER ARTHROSCOPY     x2   SHOULDER LATERJET     x3   TONSILLECTOMY  2005   UPPER GI ENDOSCOPY  06/07/2018   Dr Norma Fredrickson   WISDOM TOOTH EXTRACTION      There were no vitals filed for this visit.   Subjective Assessment - 05/13/21 0801     Subjective Pt reports she is doing well today. Her pain is at a 3/10 in her R shoulder. States she did not sleep well last night due to knee pain. She stopped taking pain meds 1.5 weeks ago. Asked about if she can take Aleve and/or ibuprofin - PT advised pt to call MD office.    Pertinent History Right shoulder recurrent instability now s/p anterior capsulorrhaphy c bone block 04/07/21. Pt seen at this clinic for rehab s/p similar surgery on Left shoulder.    Limitations Lifting;Standing;Writing;House hold activities    Patient Stated Goals Increase R  shoulder ROM/ strength to improve pain-free mobility.    Currently in Pain? Yes    Pain Score 3     Pain Location Shoulder    Pain Orientation Right    Pain Descriptors / Indicators Aching    Pain Type Surgical pain    Pain Onset 1 to 4 weeks ago                There.ex.: Supine wand ex.: AAROM press-ups/ shoulder flexion/ ER/ abduction 2 x 10; Seated wand ex: AAROM shoulder flexion, abduction, bicep curls 2 x 10 (tactile and verbal cueing to prevent shoulder elevation); HEP reviewed and educated on wearing the sling at all times;     Manual: STM to R biceps and brachialis x 5 min; STM to R proximal triceps x 2 min; STM to R supraspinatus, infraspinatus, teres minor, rhomboids, mid-trap x 8 min;       Pt educated throughout session about proper posture and technique with exercises. Improved exercise technique, movement at target joints, use of target muscles after min to mod verbal, visual, tactile cues.        Pt arrives with excellent motivation at today's session. STM was performed to additional muscle groups today due to pt report of  tenderness at rest and with light touch. Significant trigger points were noted in the biceps, brachialis, triceps and infraspinatus. Pt demonstrates compensation with R scapular elevation to achieve 90* of R AAROM shoulder flexion in sitting. Heavy cueing (verbal and tactile) to prevent scapular compensation during AAROM. Passive and active ROM continues to be limited in all planes, especially in ER. Pt was educated on wearing the sling at all times except when performing therapeutic exercises and showering; she was more receptive today than the previous session. Patient is progressing toward functional ROM and strength in R shoulder and continues need for skilled services.         PT Long Term Goals - 05/03/21 0908       PT LONG TERM GOAL #1   Title Pt. I with HEP to increase R shoulder AROM to [facility] as compared to L shoulder to improve  pain-free mobility.    Baseline R sh. limited to 90 deg. flexion/ 45 deg. abduction/ 1 deg. ER per MD protocol.    Time 8    Period Weeks    Status New    Target Date 06/22/21      PT LONG TERM GOAL #2   Title Pt. will increase R deltoid/ biceps/ triceps strength to grossly 4/5 MMT to improve return to household tasks/ running.    Baseline No R shoulder MMT testing per MD protocol    Time 8    Period Weeks    Status New    Target Date 06/22/21      PT LONG TERM GOAL #3   Title Pt. will increase FOTO to 60 to improve R shoulder mobility.    Baseline FOTO: baseline 29    Time 8    Period Weeks    Status New    Target Date 06/22/21      PT LONG TERM GOAL #4   Title Pt. able to complete all work related tasks with no R shoulder limitations or pain to promote return to PLOF.    Baseline Pt. has not returned to work at this time due to R shoulder limitations.    Time 8    Period Weeks    Status New    Target Date 06/22/21                   Plan - 05/13/21 2353     Clinical Impression Statement Pt arrives with excellent motivation at today's session. STM was performed to additional muscle groups today due to pt report of tenderness at rest and with light touch. Significant trigger points were noted in the biceps, brachialis, triceps and infraspinatus. Pt demonstrates compensation with R scapular elevation to achieve 90* of R AAROM shoulder flexion in sitting. Heavy cueing (verbal and tactile) to prevent scapular compensation during AAROM. Passive and active ROM continues to be limited in all planes, especially in ER. Pt was educated on wearing the sling at all times except when performing therapeutic exercises and showering; she was more receptive today than the previous session. Patient is progressing toward functional ROM and strength in R shoulder and continues need for skilled services.    Personal Factors and Comorbidities Age;Fitness    Stability/Clinical Decision Making  Stable/Uncomplicated    Rehab Potential Good    PT Frequency 2x / week    PT Duration 8 weeks    PT Treatment/Interventions ADLs/Self Care Home Management;Aquatic Therapy;Cryotherapy;Electrical Stimulation;Therapeutic exercise;Patient/family education;Neuromuscular re-education;Manual techniques;Scar mobilization;Passive range of motion;Functional mobility training    PT  Next Visit Plan Follow MD protocol.  Pt. currently in Phase I (4 weeks s/p surgery).    PT Home Exercise Plan See handouts    Consulted and Agree with Plan of Care Patient             Patient will benefit from skilled therapeutic intervention in order to improve the following deficits and impairments:  Pain, Decreased mobility, Decreased scar mobility, Hypomobility, Decreased strength, Decreased range of motion, Decreased endurance, Decreased activity tolerance, Impaired flexibility  Visit Diagnosis: Shoulder joint stiffness, right  Muscle weakness (generalized)  Acute pain of right shoulder     Problem List Patient Active Problem List   Diagnosis Date Noted   Generalized abdominal pain 06/19/2018   Nausea and vomiting 06/19/2018   Loss of weight 06/19/2018   Headache 12/18/2012   Hypothermia 12/18/2012   Basilia Jumbo PT, DPT  Lavenia Atlas 05/13/2021, 9:51 AM  [facility] [address] [place], [place], [address] Phone: [phone]   Fax:  [phone]  Name: Sara Gomez MRN: [id number] Date of Birth: 04-14-1996

## 2021-05-17 ENCOUNTER — Other Ambulatory Visit: Payer: Self-pay

## 2021-05-17 ENCOUNTER — Ambulatory Visit: Payer: 59

## 2021-05-17 DIAGNOSIS — M25611 Stiffness of right shoulder, not elsewhere classified: Secondary | ICD-10-CM | POA: Diagnosis not present

## 2021-05-17 DIAGNOSIS — M6281 Muscle weakness (generalized): Secondary | ICD-10-CM

## 2021-05-17 NOTE — Therapy (Signed)
[facility] [address] [place], [place], [address] Phone: [phone]   Fax:  [phone]  Physical Therapy Treatment  Patient Details  Name: Sara Gomez MRN: [id number] Date of Birth: 26-May-1996 Referring Provider (PT): Dr. Roney Mans   Encounter Date: 05/17/2021   PT End of Session - 05/17/21 0847     Visit Number 7    Number of Visits 17    Date for PT Re-Evaluation 06/22/21    Authorization Type [employer] COMM Pro (primary); U[employer] (secondary); visit limits based on necessity    Authorization Time Period 04/27/21- 06/22/21    Authorization - Visit Number 5    Authorization - Number of Visits 10    PT Start Time 0845    PT Stop Time 0930    PT Time Calculation (min) 45 min    Activity Tolerance Patient tolerated treatment well    Behavior During Therapy [facility] for tasks assessed/performed             Past Medical History:  Diagnosis Date   Abdominal pain 05/23/2018   Nausea and vomiting 05/23/2018   Per [facility] Hepato w [facility] Burn order    Past Surgical History:  Procedure Laterality Date   COLONOSCOPY  06/07/2018   Dr Norma Fredrickson   SHOULDER ARTHROSCOPY     x2   SHOULDER LATERJET     x3   TONSILLECTOMY  2005   UPPER GI ENDOSCOPY  06/07/2018   Dr Norma Fredrickson   WISDOM TOOTH EXTRACTION      There were no vitals filed for this visit.   Subjective Assessment - 05/17/21 0846     Subjective Pt reports she is doing well today. Her R shoulder is not causing her any pain upon arrival but she is having 4/10 l shoulder pain.  She was able to transition to sleeping back in her bed. She returns to see Dr. Roney Mans this Friday.    Pertinent History Right shoulder recurrent instability now s/p anterior capsulorrhaphy c bone block 04/07/21. Pt seen at this clinic for rehab s/p similar surgery on Left shoulder.    Limitations Lifting;Standing;Writing;House hold activities    Patient Stated Goals Increase R shoulder ROM/ strength to improve  pain-free mobility.    Currently in Pain? Yes    Pain Score 4     Pain Location Shoulder    Pain Orientation Left    Pain Descriptors / Indicators Aching    Pain Type Chronic pain    Pain Onset 1 to 4 weeks ago               TREATMENT   Ther-ex  Supine wand R shoulder AAROM press-ups/ shoulder flexion/ ER/ abduction within limitations of protocol x 15 each; Seated wand R shoulder AAROM flexion, abduction, and ER within limitations of protocol x 15 each (tactile and verbal cueing to prevent R shoulder hiking during forward flexion);     Manual: Gentle R shoulder PROM in all directions within restrictions of protocol, pain, and never pushing through resistance; STM in supine and L sidelying with R shoulder supported to R biceps, brachialis, anterior deltoid, lateral deltoid, posterior deltoid, supraspinatus, infraspinatus, rhomboids, mid trap, and upper trap; Ice to R shoulder in seated position after treatment session x 5 minutes during pt education;    Pt educated throughout session about proper posture and technique with exercises. Improved exercise technique, movement at target joints, use of target muscles after min to mod verbal, visual, tactile cues.  Pt arrives with excellent motivation at today's session. She reports some soreness/pain in the posterior aspect of her L shoulder over her shoulder blade and along the medial border. STM performed today to decrease pain to anterior, lateral, and posterior shoulder as well as mid trap/rhomboid. Continued with light AAROM with cane as well as gentle PROM. In sitting pt continues to demonstrate poor motor control with R shoulder hiking requiring repeated verbal and tactile cues to minimize. R shoulder external rotation remains very limited. Patient is progressing toward functional ROM and strength in R shoulder and continues need for skilled services.                                   PT Long Term  Goals - 05/03/21 0908       PT LONG TERM GOAL #1   Title Pt. I with HEP to increase R shoulder AROM to [facility] as compared to L shoulder to improve pain-free mobility.    Baseline R sh. limited to 90 deg. flexion/ 45 deg. abduction/ 1 deg. ER per MD protocol.    Time 8    Period Weeks    Status New    Target Date 06/22/21      PT LONG TERM GOAL #2   Title Pt. will increase R deltoid/ biceps/ triceps strength to grossly 4/5 MMT to improve return to household tasks/ running.    Baseline No R shoulder MMT testing per MD protocol    Time 8    Period Weeks    Status New    Target Date 06/22/21      PT LONG TERM GOAL #3   Title Pt. will increase FOTO to 60 to improve R shoulder mobility.    Baseline FOTO: baseline 29    Time 8    Period Weeks    Status New    Target Date 06/22/21      PT LONG TERM GOAL #4   Title Pt. able to complete all work related tasks with no R shoulder limitations or pain to promote return to PLOF.    Baseline Pt. has not returned to work at this time due to R shoulder limitations.    Time 8    Period Weeks    Status New    Target Date 06/22/21                   Plan - 05/17/21 0848     Clinical Impression Statement Pt arrives with excellent motivation at today's session. She reports some soreness/pain in the posterior aspect of her L shoulder over her shoulder blade and along the medial border. STM performed today to decrease pain to anterior, lateral, and posterior shoulder as well as mid trap/rhomboid. Continued with light AAROM with cane as well as gentle PROM. In sitting pt continues to demonstrate poor motor control with R shoulder hiking requiring repeated verbal and tactile cues to minimize. R shoulder external rotation remains very limited. Patient is progressing toward functional ROM and strength in R shoulder and continues need for skilled services.    Personal Factors and Comorbidities Age;Fitness    Stability/Clinical Decision Making  Stable/Uncomplicated    Rehab Potential Good    PT Frequency 2x / week    PT Duration 8 weeks    PT Treatment/Interventions ADLs/Self Care Home Management;Aquatic Therapy;Cryotherapy;Electrical Stimulation;Therapeutic exercise;Patient/family education;Neuromuscular re-education;Manual techniques;Scar mobilization;Passive range of motion;Functional mobility training  PT Next Visit Plan Follow MD protocol.  Pt. currently in Phase I/II (wk 6 s/p surgery).    PT Home Exercise Plan See handouts    Consulted and Agree with Plan of Care Patient             Patient will benefit from skilled therapeutic intervention in order to improve the following deficits and impairments:  Pain, Decreased mobility, Decreased scar mobility, Hypomobility, Decreased strength, Decreased range of motion, Decreased endurance, Decreased activity tolerance, Impaired flexibility  Visit Diagnosis: Shoulder joint stiffness, right  Muscle weakness (generalized)     Problem List Patient Active Problem List   Diagnosis Date Noted   Generalized abdominal pain 06/19/2018   Nausea and vomiting 06/19/2018   Loss of weight 06/19/2018   Headache 12/18/2012   Hypothermia 12/18/2012   Sharalyn Ink Zyrus Hetland PT, DPT, GCS  Aldin Drees 05/17/2021, 9:46 AM  [facility] [address] [place], [place], [address] Phone: [phone]   Fax:  [phone]  Name: Kalicia Dufresne Chronister MRN: [id number] Date of Birth: December 01, 1995

## 2021-05-19 ENCOUNTER — Ambulatory Visit: Payer: 59

## 2021-05-19 ENCOUNTER — Other Ambulatory Visit: Payer: Self-pay

## 2021-05-19 DIAGNOSIS — M25511 Pain in right shoulder: Secondary | ICD-10-CM

## 2021-05-19 DIAGNOSIS — M25611 Stiffness of right shoulder, not elsewhere classified: Secondary | ICD-10-CM | POA: Diagnosis not present

## 2021-05-19 DIAGNOSIS — M6281 Muscle weakness (generalized): Secondary | ICD-10-CM

## 2021-05-19 NOTE — Therapy (Signed)
[facility] [address] [place], [place], [address] Phone: [phone]   Fax:  [phone]  Physical Therapy Treatment  Patient Details  Name: Sara Gomez MRN: [id number] Date of Birth: [birth year] Referring Provider (PT): Dr. Roney Mans   Encounter Date: 05/19/2021   PT End of Session - 05/19/21 1024     Visit Number 8    Number of Visits 17    Date for PT Re-Evaluation 06/22/21    Authorization Type [employer]BS COMM Pro (primary); [employer] (secondary); visit limits based on necessity    Authorization Time Period 04/27/21- 06/22/21    Authorization - Visit Number 8    Authorization - Number of Visits 10    PT Start Time 0755    PT Stop Time 0845    PT Time Calculation (min) 50 min    Activity Tolerance Patient tolerated treatment well    Behavior During Therapy [facility] for tasks assessed/performed              Past Medical History:  Diagnosis Date   Abdominal pain 05/23/2018   Nausea and vomiting 05/23/2018   Per [facility] Hepato w [facility] Burn order    Past Surgical History:  Procedure Laterality Date   COLONOSCOPY  06/07/2018   Dr Norma Fredrickson   SHOULDER ARTHROSCOPY     x2   SHOULDER LATERJET     x3   TONSILLECTOMY  2005   UPPER GI ENDOSCOPY  06/07/2018   Dr Norma Fredrickson   WISDOM TOOTH EXTRACTION      There were no vitals filed for this visit.   Subjective Assessment - 05/19/21 1022     Subjective Pt reports she is doing alright today. Complains of 2/10 R shoulder pain upon arrival. No excessive soreness after last therapy session. She has an appointment to see Dr. Roney Mans tomoroww morning. She has continued working on getting comfortable sleeping in her bed.    Pertinent History Right shoulder recurrent instability now s/p anterior capsulorrhaphy c bone block 04/07/21. Pt seen at this clinic for rehab s/p similar surgery on Left shoulder.    Limitations Lifting;Standing;Writing;House hold activities    Patient Stated Goals Increase R  shoulder ROM/ strength to improve pain-free mobility.                 TREATMENT   Ther-ex  Supine wand R shoulder AAROM press-ups/ shoulder flexion/ ER/ abduction within limitations of protocol x 20 each; Very light R elbow isometric flexion/extension in supine with arm at side 5s hold x 10 each; Very light R elbow isometric IR/ER in supine with arm at side 5s hold x 10 each;      Manual: Gentle R shoulder PROM in all directions within restrictions of protocol, pain, and never pushing through resistance; STM in supine and L sidelying with R shoulder supported to R biceps, brachialis, anterior deltoid, lateral deltoid, posterior deltoid, supraspinatus, infraspinatus, rhomboids, mid trap, and upper trap; Ice to R shoulder in seated position after treatment session x 5 minutes during pt education;    Pt educated throughout session about proper posture and technique with exercises. Improved exercise technique, movement at target joints, use of target muscles after min to mod verbal, visual, tactile cues.      Pt arrives with excellent motivation at today's session. She continues to report soreness/pain in the posterior aspect of her L shoulder over her shoulder blade and along the medial border. STM performed today to decrease pain to anterior, lateral, and posterior  shoulder as well as mid trap/rhomboid. Continued with light AAROM with cane as well as gentle PROM.  Initiated some very light right elbow and right shoulder exercises in supine with arm at side.  Patient denies any increase in pain. R shoulder external rotation remains very limited. Patient is progressing toward functional ROM and strength in R shoulder and continues need for skilled services.                                   PT Long Term Goals - 05/03/21 0908       PT LONG TERM GOAL #1   Title Pt. I with HEP to increase R shoulder AROM to [facility] as compared to L shoulder to improve pain-free  mobility.    Baseline R sh. limited to 90 deg. flexion/ 45 deg. abduction/ 1 deg. ER per MD protocol.    Time 8    Period Weeks    Status New    Target Date 06/22/21      PT LONG TERM GOAL #2   Title Pt. will increase R deltoid/ biceps/ triceps strength to grossly 4/5 MMT to improve return to household tasks/ running.    Baseline No R shoulder MMT testing per MD protocol    Time 8    Period Weeks    Status New    Target Date 06/22/21      PT LONG TERM GOAL #3   Title Pt. will increase FOTO to 60 to improve R shoulder mobility.    Baseline FOTO: baseline 29    Time 8    Period Weeks    Status New    Target Date 06/22/21      PT LONG TERM GOAL #4   Title Pt. able to complete all work related tasks with no R shoulder limitations or pain to promote return to PLOF.    Baseline Pt. has not returned to work at this time due to R shoulder limitations.    Time 8    Period Weeks    Status New    Target Date 06/22/21                   Plan - 05/19/21 1024     Clinical Impression Statement ?Pt arrives with excellent motivation at today's session. She continues to report soreness/pain in the posterior aspect of her L shoulder over her shoulder blade and along the medial border. STM performed today to decrease pain to anterior, lateral, and posterior shoulder as well as mid trap/rhomboid. Continued with light AAROM with cane as well as gentle PROM.  Initiated some very light right elbow and right shoulder exercises in supine with arm at side.  Patient denies any increase in pain. R shoulder external rotation remains very limited. Patient is progressing toward functional ROM and strength in R shoulder and continues need for skilled services.    Personal Factors and Comorbidities Age;Fitness    Stability/Clinical Decision Making Stable/Uncomplicated    Rehab Potential Good    PT Frequency 2x / week    PT Duration 8 weeks    PT Treatment/Interventions ADLs/Self Care Home  Management;Aquatic Therapy;Cryotherapy;Electrical Stimulation;Therapeutic exercise;Patient/family education;Neuromuscular re-education;Manual techniques;Scar mobilization;Passive range of motion;Functional mobility training    PT Next Visit Plan Follow MD protocol.  Pt. currently in Phase I/II (wk 6 s/p surgery).    PT Home Exercise Plan See handouts    Consulted and Agree with  Plan of Care Patient              Patient will benefit from skilled therapeutic intervention in order to improve the following deficits and impairments:  Pain, Decreased mobility, Decreased scar mobility, Hypomobility, Decreased strength, Decreased range of motion, Decreased endurance, Decreased activity tolerance, Impaired flexibility  Visit Diagnosis: Shoulder joint stiffness, right  Muscle weakness (generalized)  Acute pain of right shoulder     Problem List Patient Active Problem List   Diagnosis Date Noted   Generalized abdominal pain 06/19/2018   Nausea and vomiting 06/19/2018   Loss of weight 06/19/2018   Headache 12/18/2012   Hypothermia 12/18/2012   Sharalyn Ink Orianna Biskup PT, DPT, GCS  Zenya Hickam 05/19/2021, 1:13 PM  [place] [facility] [address] [place], [place], [address] Phone: [phone]   Fax:  [phone]  Name: Sara Gomez MRN: [id number] Date of Birth: 1996-02-16

## 2021-05-23 ENCOUNTER — Ambulatory Visit: Payer: 59 | Admitting: Physical Therapy

## 2021-05-23 ENCOUNTER — Other Ambulatory Visit: Payer: Self-pay

## 2021-05-23 DIAGNOSIS — M25611 Stiffness of right shoulder, not elsewhere classified: Secondary | ICD-10-CM | POA: Diagnosis not present

## 2021-05-23 DIAGNOSIS — M25511 Pain in right shoulder: Secondary | ICD-10-CM

## 2021-05-23 DIAGNOSIS — M6281 Muscle weakness (generalized): Secondary | ICD-10-CM

## 2021-05-23 NOTE — Therapy (Signed)
[place] [facility] [address] [place], [place], [address] Phone: [phone]   Fax:  [phone]  Physical Therapy Treatment  Patient Details  Name: Sara Gomez MRN: [id number] Date of Birth: 29-Aug-1996 Referring Provider (PT): Dr. Roney Mans   Encounter Date: 05/23/2021   PT End of Session - 05/23/21 1111     Visit Number 9    Number of Visits 17    Date for PT Re-Evaluation 06/22/21    Authorization Type [employer]BS COMM Pro (primary); [employer] (secondary); visit limits based on necessity    Authorization Time Period 04/27/21- 06/22/21    Authorization - Visit Number 9    Authorization - Number of Visits 10    PT Start Time 0930    PT Stop Time 1016    PT Time Calculation (min) 46 min    Activity Tolerance Patient tolerated treatment well    Behavior During Therapy [facility] for tasks assessed/performed             Past Medical History:  Diagnosis Date   Abdominal pain 05/23/2018   Nausea and vomiting 05/23/2018   Per [facility] Hepato w [facility] Burn order    Past Surgical History:  Procedure Laterality Date   COLONOSCOPY  06/07/2018   Dr Norma Fredrickson   SHOULDER ARTHROSCOPY     x2   SHOULDER LATERJET     x3   TONSILLECTOMY  2005   UPPER GI ENDOSCOPY  06/07/2018   Dr Norma Fredrickson   WISDOM TOOTH EXTRACTION      There were no vitals filed for this visit.   Subjective Assessment - 05/23/21 1112     Subjective Pt presented to session reporting having bad migraine last night. Pt reports she has been driving since doctor's clearance last Friday?. R shoulder feels stiff but no pain resting.    Pertinent History Pt presented to session reporting having bad migraine last night. Pt reports she has been driving since doctor's clearance last Friday. R shoulder feels stiff but no pain resting 3/10 pain with active ROM.    Limitations Lifting;Standing;Writing;House hold activities    Patient Stated Goals Increase R shoulder ROM/ strength to improve pain-free  mobility.    Currently in Pain? Yes    Pain Score 3     Pain Location Shoulder    Pain Orientation Right    Pain Descriptors / Indicators Aching    Pain Type Chronic pain    Pain Onset 1 to 4 weeks ago    Pain Frequency Intermittent               Treatment:   Manual Therapy:   Suboccipital release/manual cervical distraction x10 minutes-- pt reports decreased migraine post manual therapy  R shoulder: Passive stretch to abduction/flexion/ER to 80/105/5 degree.   R shoulder: isometric hold IR/ER 3x30s each  Ther-ex Supine wand R shoulder AAROM press-ups/ shoulder flexion/ ER/ abduction within limitations of protocol x 20 each;   Standing R shoulder isometric shoulder press: flexion/extension/external rotation with yellow ball 3x30s each      PT Long Term Goals - 05/03/21 0908       PT LONG TERM GOAL #1   Title Pt. I with HEP to increase R shoulder AROM to [facility] as compared to L shoulder to improve pain-free mobility.    Baseline R sh. limited to 90 deg. flexion/ 45 deg. abduction/ 1 deg. ER per MD protocol.    Time 8    Period Weeks    Status  New    Target Date 06/22/21      PT LONG TERM GOAL #2   Title Pt. will increase R deltoid/ biceps/ triceps strength to grossly 4/5 MMT to improve return to household tasks/ running.    Baseline No R shoulder MMT testing per MD protocol    Time 8    Period Weeks    Status New    Target Date 06/22/21      PT LONG TERM GOAL #3   Title Pt. will increase FOTO to 60 to improve R shoulder mobility.    Baseline FOTO: baseline 29    Time 8    Period Weeks    Status New    Target Date 06/22/21      PT LONG TERM GOAL #4   Title Pt. able to complete all work related tasks with no R shoulder limitations or pain to promote return to PLOF.    Baseline Pt. has not returned to work at this time due to R shoulder limitations.    Time 8    Period Weeks    Status New    Target Date 06/22/21                   Plan -  05/23/21 1113     Clinical Impression Statement Pt drove to session since she was cleared to drive by doctor last Friday. Pt demonstrated reduced tolerance to thera.ex d/t persistence of migraine. Migraine was reduce with manual traction suboccipital release. Pt ed on self release technique to reduce posterior neck and upper trap tension.  R shoulder flexion AROM remains limited to 100 deg. due to joint stiffness/ pain. (see protocol)    Personal Factors and Comorbidities Fitness    Stability/Clinical Decision Making Stable/Uncomplicated    Clinical Decision Making Low    Rehab Potential Good    PT Frequency 2x / week    PT Duration 8 weeks    PT Treatment/Interventions ADLs/Self Care Home Management;Aquatic Therapy;Cryotherapy;Electrical Stimulation;Therapeutic exercise;Patient/family education;Neuromuscular re-education;Manual techniques;Scar mobilization;Passive range of motion;Functional mobility training    PT Next Visit Plan Follow MD protocol.  Pt. currently in Phase I/II (wk 6 s/p surgery).    PT Home Exercise Plan See handouts    Consulted and Agree with Plan of Care Patient             Patient will benefit from skilled therapeutic intervention in order to improve the following deficits and impairments:  Pain, Decreased mobility, Decreased scar mobility, Hypomobility, Decreased strength, Decreased range of motion, Decreased endurance, Decreased activity tolerance, Impaired flexibility  Visit Diagnosis: Shoulder joint stiffness, right  Muscle weakness (generalized)  Acute pain of right shoulder     Problem List Patient Active Problem List   Diagnosis Date Noted   Generalized abdominal pain 06/19/2018   Nausea and vomiting 06/19/2018   Loss of weight 06/19/2018   Headache 12/18/2012   Hypothermia 12/18/2012   [name], PT, DPT # [id number] Jenny Reichmann, SPT 05/24/2021, 8:23 AM  [place] [facility] [address] [place], [place], [address] Phone: [phone]   Fax:  [phone]  Name: Sara Gomez MRN: [id number] Date of Birth: 06-Sep-1996

## 2021-05-24 ENCOUNTER — Encounter: Payer: Self-pay | Admitting: Physical Therapy

## 2021-05-25 ENCOUNTER — Encounter: Payer: Self-pay | Admitting: Physical Therapy

## 2021-05-25 ENCOUNTER — Ambulatory Visit: Payer: 59 | Admitting: Physical Therapy

## 2021-05-25 ENCOUNTER — Other Ambulatory Visit: Payer: Self-pay

## 2021-05-25 DIAGNOSIS — G8929 Other chronic pain: Secondary | ICD-10-CM

## 2021-05-25 DIAGNOSIS — M25611 Stiffness of right shoulder, not elsewhere classified: Secondary | ICD-10-CM | POA: Diagnosis not present

## 2021-05-25 DIAGNOSIS — M25511 Pain in right shoulder: Secondary | ICD-10-CM

## 2021-05-25 DIAGNOSIS — M25612 Stiffness of left shoulder, not elsewhere classified: Secondary | ICD-10-CM

## 2021-05-25 DIAGNOSIS — M6281 Muscle weakness (generalized): Secondary | ICD-10-CM

## 2021-05-25 NOTE — Therapy (Signed)
[facility] [address] [place], [place], [address] Phone: [phone]   Fax:  [phone]  Physical Therapy Treatment Physical Therapy Progress Note   Dates of reporting period  04/27/2021  to  05/25/2021  Patient Details  Name: Sara Gomez MRN: [id number] Date of Birth: 05-22-1996 Referring Provider (PT): Dr. Jeannie Fend   Encounter Date: 05/25/2021   PT End of Session - 05/25/21 1033     Visit Number 10    Number of Visits 17    Date for PT Re-Evaluation 06/22/21    Authorization Type BCBS COMM Pro (primary); UHC (secondary); visit limits based on necessity    Authorization Time Period 04/27/21- 06/22/21    Authorization - Visit Number 10    Authorization - Number of Visits 10    PT Start Time 0941    PT Stop Time 1028    PT Time Calculation (min) 47 min    Activity Tolerance Patient tolerated treatment well    Behavior During Therapy [facility] for tasks assessed/performed             Past Medical History:  Diagnosis Date   Abdominal pain 05/23/2018   Nausea and vomiting 05/23/2018   Per NM Hepato w Marjean Donna order    Past Surgical History:  Procedure Laterality Date   COLONOSCOPY  06/07/2018   Dr Alice Reichert   SHOULDER ARTHROSCOPY     x2   SHOULDER LATERJET     x3   TONSILLECTOMY  2005   UPPER GI ENDOSCOPY  06/07/2018   Dr Alice Reichert   WISDOM TOOTH EXTRACTION      There were no vitals filed for this visit.   Subjective Assessment - 05/25/21 1028     Subjective Pt presents to tx with 3/10 L shoulder pain and 4/10 headache/migraine pain. Headache has reduced compared to last visit. Pt did not get good sleep last night due to first time intergration of sleeping with her post surgery.    Pertinent History Pt presented to session reporting having bad migraine last night. Pt reports she has been driving since doctor's clearance last Friday. R shoulder feels stiff but no pain resting 3/10 pain with active ROM.     Limitations Lifting;Standing;Writing;House hold activities    Patient Stated Goals Increase R shoulder ROM/ strength to improve pain-free mobility.    Currently in Pain? Yes    Pain Score 3     Pain Location Shoulder    Pain Orientation Right    Pain Descriptors / Indicators Aching    Pain Type Surgical pain;Chronic pain    Pain Onset 1 to 4 weeks ago    Pain Frequency Intermittent             Therapeutic Exercise:  Supine exercises with verbal and contact cueing to ensure safety and quality of movement that is agreeable with surgical protocol.   2# ABCs at 90 deg of shoulder flex. SBA due to newness of exercise.   Isometric against door frame and towel: 1 min each direction: flex, abd, ER/IR    Slow Speed Oscillation Isometrics with GTB. Contact cueing in the Deltoid to ensure proper muscle activation. ~ 5 sec oscillation holds or until deltoid fatigue. X12 each direction: L shoulder flex/ext/ER/IR/Abd.     Manual Therapy:  STM to L distal pectoralis, deltoid, and posterior rotator cuff during PROM with slight OP L shoulder flex, scapation, and Abd. 17 mins      PT Education - 05/25/21 1031  Education Details Pt educated on progressed from initial eval, importance of isometrics for increased shoulder strength and stability.    Person(s) Educated Patient    Methods Explanation;Tactile cues;Verbal cues    Comprehension Verbalized understanding;Returned demonstration                 PT Long Term Goals - 05/25/21 0943       PT LONG TERM GOAL #1   Title Pt. I with HEP to increase R shoulder AROM to [facility] as compared to L shoulder to improve pain-free mobility.    Baseline R sh. limited to 90 deg. flexion/ 45 deg. abduction/ 1 deg. ER per MD protocol. 7/13: Abd 90 deg flex 110 deg. ER 1    Time 8    Period Weeks    Status Partially Met    Target Date 06/22/21      PT LONG TERM GOAL #2   Title Pt. will increase R deltoid/ biceps/ triceps strength to grossly  4/5 MMT to improve return to household tasks/ running.    Baseline No R shoulder MMT testing per MD protocol 7/13: R shoulder Flex 4-/5 abd 4-/5 ER 4-/5 IR 4-/5    Time 8    Period Weeks    Status New      PT LONG TERM GOAL #3   Title Pt. will increase FOTO to 60 to improve R shoulder mobility.    Baseline FOTO: baseline 29 7/13: 56    Time 8    Period Weeks    Status Partially Met      PT LONG TERM GOAL #4   Title Pt. able to complete all work related tasks with no R shoulder limitations or pain to promote return to PLOF.    Baseline Pt. has not returned to work at this time due to R shoulder limitations. 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    Time 8    Period Weeks    Status New                   Plan - 05/25/21 1035     Clinical Impression Statement Pt displayed good tolerance to increased L shoulder loading via isometric/stabilization exercises without increase in pain. All tx was agreeable with provided surgical protocol. Pt displayed Increase in FOTO score to 56. L shoulder AROM:  Abd 90 deg flex 110 deg and ER 1 deg. Pt ROM is progression ROM well besides ER. Pt displays poor ER rotation on the no affected side as well. L shoulder MMT were obtained via surgical protocol:  R shoulder Flex 4-/5 abd 4-/5 ER 4-/5 IR 4-/5. 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 Pt. will continue to benefit from skilled physical therapy to progress POC to address remaining deficits to facilitate maximum functional capacity for optimal personal health and wellness for ADLs.    Personal Factors and Comorbidities Fitness    Stability/Clinical Decision Making Stable/Uncomplicated    Clinical Decision Making Moderate    Rehab Potential Good    PT Frequency 2x / week    PT Duration 8 weeks    PT Treatment/Interventions ADLs/Self Care Home Management;Aquatic Therapy;Cryotherapy;Electrical Stimulation;Therapeutic exercise;Patient/family  education;Neuromuscular re-education;Manual techniques;Scar mobilization;Passive range of motion;Functional mobility training    PT Next Visit Plan Follow MD protocol.  Pt. currently in Phase I/II (wk 6 s/p surgery).    PT Home Exercise Plan See handouts    Consulted and Agree  with Plan of Care Patient             Patient will benefit from skilled therapeutic intervention in order to improve the following deficits and impairments:  Pain, Decreased mobility, Decreased scar mobility, Hypomobility, Decreased strength, Decreased range of motion, Decreased endurance, Decreased activity tolerance, Impaired flexibility  Visit Diagnosis: Shoulder joint stiffness, right  Muscle weakness (generalized)  Acute pain of right shoulder  Shoulder joint stiffness, left  Chronic left shoulder pain     Problem List Patient Active Problem List   Diagnosis Date Noted   Generalized abdominal pain 06/19/2018   Nausea and vomiting 06/19/2018   Loss of weight 06/19/2018   Headache 12/18/2012   Hypothermia 12/18/2012   Pura Spice, PT, DPT # [id number] Fara Olden SPT 05/25/2021, 2:18 PM  [place] [address] [place], [place], [address] Phone: [phone]   Fax:  [phone]  Name: Yuvia Plant Frazee MRN: [id number] Date of Birth: [birth year]

## 2021-05-27 ENCOUNTER — Other Ambulatory Visit: Payer: Self-pay

## 2021-05-27 ENCOUNTER — Ambulatory Visit: Payer: 59

## 2021-05-27 DIAGNOSIS — M25611 Stiffness of right shoulder, not elsewhere classified: Secondary | ICD-10-CM

## 2021-05-27 DIAGNOSIS — M6281 Muscle weakness (generalized): Secondary | ICD-10-CM

## 2021-05-27 NOTE — Therapy (Signed)
[place] [facility] [address] [place], [place], [address] Phone: [phone]   Fax:  [phone]  Physical Therapy Treatment   Patient Details  Name: Tariyah Pendry Shorts MRN: [id number] Date of Birth: [birth year] Referring Provider (PT): Dr. Jeannie Fend   Encounter Date: 05/27/2021   PT End of Session - 05/27/21 0801     Visit Number 11    Number of Visits 17    Date for PT Re-Evaluation 06/22/21    Authorization Type BCBS COMM Pro (primary); UHC (secondary); visit limits based on necessity    Authorization Time Period 04/27/21- 06/22/21    Authorization - Visit Number 1    Authorization - Number of Visits 10    PT Start Time 0800    PT Stop Time 0845    PT Time Calculation (min) 45 min    Activity Tolerance Patient tolerated treatment well    Behavior During Therapy [facility] for tasks assessed/performed             Past Medical History:  Diagnosis Date   Abdominal pain 05/23/2018   Nausea and vomiting 05/23/2018   Per NM Hepato w Marjean Donna order    Past Surgical History:  Procedure Laterality Date   COLONOSCOPY  06/07/2018   Dr Alice Reichert   SHOULDER ARTHROSCOPY     x2   SHOULDER LATERJET     x3   TONSILLECTOMY  2005   UPPER GI ENDOSCOPY  06/07/2018   Dr Alice Reichert   WISDOM TOOTH EXTRACTION      There were no vitals filed for this visit.   Subjective Assessment - 05/27/21 0800     Subjective Pt presents to tx with 5/10 bilateral shoulder pain. Woke up early this morning and was unable to fall back to sleep. Denies any headache currently. No specific questions or concerns today.    Pertinent History Pt presented to session reporting having bad migraine last night. Pt reports she has been driving since doctor's clearance last Friday. R shoulder feels stiff but no pain resting 3/10 pain with active ROM.    Limitations Lifting;Standing;Writing;House hold activities    Patient Stated Goals Increase R shoulder ROM/ strength to improve  pain-free mobility.              TREATMENT   Ther-ex  Supine exercises with verbal and contact cueing to ensure safety and quality of movement that is agreeable with surgical protocol.  Supine wand R shoulder AAROM press-ups/ shoulder flexion/shoulder ER/shoulder abduction within limitations of protocol; R shoulder flexion, extension, abduction, adduction, IR, and ER isometrics with manual resistance from therapist 5-second hold x10 each direction; Right elbow flexion and extension isometrics with resistance from therapist 5-second hold x10 each direction; Supine 2# dumbbell ABC letter tracing at 90 deg of shoulder flex. SBA; Supine right shoulder rhythmic stabilization 30 seconds x 2; Patient instructed to perform isometric right shoulder strengthening at home in addition to active assisted range of motion;   Manual: Gentle R shoulder PROM in all directions within restrictions of protocol, pain, and never pushing through resistance; STM in supine R biceps, brachialis, anterior deltoid, and lateral deltoid. In prone performed STM to posterior deltoid, supraspinatus, and infraspinatus; Scar massage performed to right anterior shoulder with education patient regarding performance at home;    Pt educated throughout session about proper posture and technique with exercises. Improved exercise technique, movement at target joints, use of target muscles after min to mod verbal, visual, tactile cues.  Patient continues to report soreness/pain in the posterior aspect of her L shoulder. STM performed today to decrease pain to anterior, lateral, and posterior shoulder.  Also perform scar massage to right anterior shoulder with education patient about performing at home.  Continued with light AAROM with cane as well as gentle PROM.  Progressed right shoulder isometric strengthening and patient encouraged to perform at home.  Patient denies any increase in pain in session but does have notable  fatigue. R shoulder external rotation remains very limited but is slightly better than the last time this therapist worked with patient especially at 90 degrees of abduction. Patient is progressing toward functional ROM and strength in R shoulder and continues need for skilled services.            PT Long Term Goals - 05/25/21 0943       PT LONG TERM GOAL #1   Title Pt. I with HEP to increase R shoulder AROM to [facility] as compared to L shoulder to improve pain-free mobility.    Baseline R sh. limited to 90 deg. flexion/ 45 deg. abduction/ 1 deg. ER per MD protocol. 7/13: Abd 90 deg flex 110 deg. ER 1    Time 8    Period Weeks    Status Partially Met    Target Date 06/22/21      PT LONG TERM GOAL #2   Title Pt. will increase R deltoid/ biceps/ triceps strength to grossly 4/5 MMT to improve return to household tasks/ running.    Baseline No R shoulder MMT testing per MD protocol 7/13: R shoulder Flex 4-/5 abd 4-/5 ER 4-/5 IR 4-/5    Time 8    Period Weeks    Status New      PT LONG TERM GOAL #3   Title Pt. will increase FOTO to 60 to improve R shoulder mobility.    Baseline FOTO: baseline 29 7/13: 56    Time 8    Period Weeks    Status Partially Met      PT LONG TERM GOAL #4   Title Pt. able to complete all work related tasks with no R shoulder limitations or pain to promote return to PLOF.    Baseline Pt. has not returned to work at this time due to R shoulder limitations. 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    Time 8    Period Weeks    Status New                   Plan - 05/27/21 0802     Clinical Impression Statement Patient continues to report soreness/pain in the posterior aspect of her L shoulder. STM performed today to decrease pain to anterior, lateral, and posterior shoulder.  Also perform scar massage to right anterior shoulder with education patient about performing at home.  Continued with light AAROM with cane as well as gentle PROM.  Progressed right shoulder  isometric strengthening and patient encouraged to perform at home.  Patient denies any increase in pain in session but does have notable fatigue. R shoulder external rotation remains very limited but is slightly better than the last time this therapist worked with patient especially at 90 degrees of abduction. Patient is progressing toward functional ROM and strength in R shoulder and continues need for skilled services.    Personal Factors and Comorbidities Fitness    Stability/Clinical Decision Making Stable/Uncomplicated    Rehab Potential Good    PT Frequency 2x / week  PT Duration 8 weeks    PT Treatment/Interventions ADLs/Self Care Home Management;Aquatic Therapy;Cryotherapy;Electrical Stimulation;Therapeutic exercise;Patient/family education;Neuromuscular re-education;Manual techniques;Scar mobilization;Passive range of motion;Functional mobility training    PT Next Visit Plan Follow MD protocol.  Pt. currently in Phase I/II (wk 6 s/p surgery).    PT Home Exercise Plan See handouts    Consulted and Agree with Plan of Care Patient             Patient will benefit from skilled therapeutic intervention in order to improve the following deficits and impairments:  Pain, Decreased mobility, Decreased scar mobility, Hypomobility, Decreased strength, Decreased range of motion, Decreased endurance, Decreased activity tolerance, Impaired flexibility  Visit Diagnosis: Shoulder joint stiffness, right  Muscle weakness (generalized)     Problem List Patient Active Problem List   Diagnosis Date Noted   Generalized abdominal pain 06/19/2018   Nausea and vomiting 06/19/2018   Loss of weight 06/19/2018   Headache 12/18/2012   Hypothermia 12/18/2012     Lyndel Safe Zamoria Boss PT, DPT, GCS  05/27/2021, 9:06 AM  [place] [facility] [address] [place], [place], [address] Phone: [phone]   Fax:  [phone]  Name: Darian Ace Lamagna MRN:  [id number] Date of Birth: [birth year]

## 2021-05-30 ENCOUNTER — Other Ambulatory Visit: Payer: Self-pay

## 2021-05-30 ENCOUNTER — Encounter: Payer: Self-pay | Admitting: Physical Therapy

## 2021-05-30 ENCOUNTER — Ambulatory Visit: Payer: 59 | Admitting: Physical Therapy

## 2021-05-30 DIAGNOSIS — G8929 Other chronic pain: Secondary | ICD-10-CM

## 2021-05-30 DIAGNOSIS — M25512 Pain in left shoulder: Secondary | ICD-10-CM

## 2021-05-30 DIAGNOSIS — M6281 Muscle weakness (generalized): Secondary | ICD-10-CM

## 2021-05-30 DIAGNOSIS — M25511 Pain in right shoulder: Secondary | ICD-10-CM

## 2021-05-30 DIAGNOSIS — M25612 Stiffness of left shoulder, not elsewhere classified: Secondary | ICD-10-CM

## 2021-05-30 DIAGNOSIS — M25611 Stiffness of right shoulder, not elsewhere classified: Secondary | ICD-10-CM | POA: Diagnosis not present

## 2021-05-30 NOTE — Therapy (Signed)
[facility] [address] [place], [place], [address] Phone: [phone]   Fax:  [phone]  Physical Therapy Treatment  Patient Details  Name: Sara Gomez MRN: [id number] Date of Birth: 07-May-1996 Referring Provider (PT): Dr. Jeannie Fend   Encounter Date: 05/30/2021   PT End of Session - 05/30/21 0829     Visit Number 12    Number of Visits 17    Date for PT Re-Evaluation 06/22/21    Authorization Type [employer]BS COMM Pro (primary); U[employer] (secondary); visit limits based on necessity    Authorization Time Period 04/27/21- 06/22/21    Authorization - Visit Number 2    Authorization - Number of Visits 10    PT Start Time 0728    PT Stop Time 0816    PT Time Calculation (min) 48 min    Activity Tolerance Patient tolerated treatment well    Behavior During Therapy [facility] for tasks assessed/performed             Past Medical History:  Diagnosis Date   Abdominal pain 05/23/2018   Nausea and vomiting 05/23/2018   Per NM Hepato w Marjean Donna order    Past Surgical History:  Procedure Laterality Date   COLONOSCOPY  06/07/2018   Dr Alice Reichert   SHOULDER ARTHROSCOPY     x2   SHOULDER LATERJET     x3   TONSILLECTOMY  2005   UPPER GI ENDOSCOPY  06/07/2018   Dr Alice Reichert   WISDOM TOOTH EXTRACTION      There were no vitals filed for this visit.   Subjective Assessment - 05/30/21 0733     Subjective Pt presents to tx with 2/10 R shoulder pain. Pt states that her headache is also very min this morning. Pt reports better sleep adherence over the weekend. Pt is unable to attend her appt on 7/20.    Pertinent History Pt presented to session reporting having bad migraine last night. Pt reports she has been driving since doctor's clearance last Friday. R shoulder feels stiff but no pain resting 3/10 pain with active ROM.    Limitations Lifting;Standing;Writing;House hold activities    Patient Stated Goals Increase R shoulder ROM/ strength to  improve pain-free mobility.    Currently in Pain? Yes    Pain Score 2     Pain Location Shoulder    Pain Orientation Right    Pain Descriptors / Indicators Aching               Therapeutic Exercise:   Supine exercises with verbal and contact cueing to ensure safety and quality of movement that is agreeable with surgical protocol.   3 # ABCs at 90 deg of shoulder flex. SBA due to newness of exercise.   Wood dowel AAROM push up, bilat shoulder flex, and R ER. X30 each direction with verbal cueing for proper form.   GTB-therapist-assist isometrics prone at 90 deg of shoulder flex 45-36mns duration x2 in each direction. Shoulder abd was not done with GTB due to pain, instead hand resistance a lateral distal humerus.   Prone ext on forearms x3 mins to begin shoulder WTB tolerance.         Manuel Therapy:   STM/muscle pinning to R distal post rotator cuff during shoulder abd/flexion . 19 mins ER 20 degs post STM/muscle pinning during abd/flex          PT Education - 05/30/21 0740     Education Details Pt educated on proper  form and technique during therex and manual intervention.    Person(s) Educated Patient    Methods Explanation;Demonstration;Tactile cues    Comprehension Returned demonstration;Verbalized understanding                 PT Long Term Goals - 05/25/21 0943       PT LONG TERM GOAL #1   Title Pt. I with HEP to increase R shoulder AROM to [facility] as compared to L shoulder to improve pain-free mobility.    Baseline R sh. limited to 90 deg. flexion/ 45 deg. abduction/ 1 deg. ER per MD protocol. 7/13: Abd 90 deg flex 110 deg. ER 1    Time 8    Period Weeks    Status Partially Met    Target Date 06/22/21      PT LONG TERM GOAL #2   Title Pt. will increase R deltoid/ biceps/ triceps strength to grossly 4/5 MMT to improve return to household tasks/ running.    Baseline No R shoulder MMT testing per MD protocol 7/13: R shoulder Flex 4-/5 abd 4-/5 ER  4-/5 IR 4-/5    Time 8    Period Weeks    Status New      PT LONG TERM GOAL #3   Title Pt. will increase FOTO to 60 to improve R shoulder mobility.    Baseline FOTO: baseline 29 7/13: 56    Time 8    Period Weeks    Status Partially Met      PT LONG TERM GOAL #4   Title Pt. able to complete all work related tasks with no R shoulder limitations or pain to promote return to PLOF.    Baseline Pt. has not returned to work at this time due to R shoulder limitations. 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    Time 8    Period Weeks    Status New                   Plan - 05/30/21 0830     Clinical Impression Statement Pt displayed good tolerance to tx with the addition of prone ext on forearms, isometrics at 90 deg, and muscle pinning to the post rotator cuff. Pt improved R shoulder ER to 20 deg post manuel intervention. Pt continues to display poor R shoulder strength, endurance and ROM. Pt. will continue to benefit from skilled physical therapy to progress POC to address remaining deficits to facilitate maximum functional capacity for optimal personal health and wellness for ADLs.    Personal Factors and Comorbidities Fitness    Stability/Clinical Decision Making Stable/Uncomplicated    Clinical Decision Making Moderate    Rehab Potential Good    PT Frequency 2x / week    PT Duration 8 weeks    PT Treatment/Interventions ADLs/Self Care Home Management;Aquatic Therapy;Cryotherapy;Electrical Stimulation;Therapeutic exercise;Patient/family education;Neuromuscular re-education;Manual techniques;Scar mobilization;Passive range of motion;Functional mobility training    PT Next Visit Plan Follow MD protocol.  Pt. currently in Phase I/II (wk 6 s/p surgery).    PT Home Exercise Plan See handouts    Consulted and Agree with Plan of Care Patient             Patient will benefit from skilled therapeutic intervention in order to improve the following deficits and impairments:  Pain, Decreased  mobility, Decreased scar mobility, Hypomobility, Decreased strength, Decreased range of motion, Decreased endurance, Decreased activity tolerance, Impaired flexibility  Visit Diagnosis: Shoulder joint stiffness, right  Chronic left shoulder pain  Acute pain of right shoulder  Shoulder joint stiffness, left  Muscle weakness (generalized)     Problem List Patient Active Problem List   Diagnosis Date Noted   Generalized abdominal pain 06/19/2018   Nausea and vomiting 06/19/2018   Loss of weight 06/19/2018   Headache 12/18/2012   Hypothermia 12/18/2012   Pura Spice, PT, DPT # [id number] Fara Olden SPT 05/30/2021, 11:17 AM  [place] [facility] [address] [place], [place], [address] Phone: [phone]   Fax:  [phone]  Name: Sara Gomez MRN: [id number] Date of Birth: May 04, 1996

## 2021-06-01 ENCOUNTER — Encounter: Payer: 59 | Admitting: Physical Therapy

## 2021-06-03 ENCOUNTER — Other Ambulatory Visit: Payer: Self-pay

## 2021-06-03 ENCOUNTER — Ambulatory Visit: Payer: 59

## 2021-06-03 DIAGNOSIS — M25511 Pain in right shoulder: Secondary | ICD-10-CM

## 2021-06-03 DIAGNOSIS — M25611 Stiffness of right shoulder, not elsewhere classified: Secondary | ICD-10-CM | POA: Diagnosis not present

## 2021-06-03 DIAGNOSIS — M6281 Muscle weakness (generalized): Secondary | ICD-10-CM

## 2021-06-03 NOTE — Therapy (Signed)
[facility] [address] [place], [place], [address] Phone: [phone]   Fax:  [phone]  Physical Therapy Treatment  Patient Details  Name: Sara Gomez MRN: [id number] Date of Birth: 06-30-1996 Referring Provider (PT): Dr. Jeannie Fend   Encounter Date: 06/03/2021   PT End of Session - 06/03/21 0754     Visit Number 13    Number of Visits 17    Date for PT Re-Evaluation 06/22/21    Authorization Type BCBS COMM Pro (primary); UHC (secondary); visit limits based on necessity    Authorization Time Period 04/27/21- 06/22/21    Authorization - Visit Number 3    Authorization - Number of Visits 10    PT Start Time 2951    PT Stop Time 0845    PT Time Calculation (min) 51 min    Activity Tolerance Patient tolerated treatment well    Behavior During Therapy [facility] for tasks assessed/performed             Past Medical History:  Diagnosis Date   Abdominal pain 05/23/2018   Nausea and vomiting 05/23/2018   Per [place] Hepato w Marjean Donna order    Past Surgical History:  Procedure Laterality Date   COLONOSCOPY  06/07/2018   Dr Alice Reichert   SHOULDER ARTHROSCOPY     x2   SHOULDER LATERJET     x3   TONSILLECTOMY  2005   UPPER GI ENDOSCOPY  06/07/2018   Dr Alice Reichert   WISDOM TOOTH EXTRACTION      There were no vitals filed for this visit.   Subjective Assessment - 06/03/21 0755     Subjective Pt stated that she was very sore after last tx for 3 days. Pt stated 6-7/10 global L shoulder soreness but no R shoulder pain upon arrival. Pt did not have to take any medication today. Pt stated that was ok with the soreness because she felt like it was the push that she needed.    Pertinent History Pt presented to session reporting having bad migraine last night. Pt reports she has been driving since doctor's clearance last Friday. R shoulder feels stiff but no pain resting 3/10 pain with active ROM.    Limitations  Lifting;Standing;Writing;House hold activities    Patient Stated Goals Increase R shoulder ROM/ strength to improve pain-free mobility.    Currently in Pain? No/denies    Pain Score 0-No pain                 TREATMENT     Ther-ex  Supine exercises with verbal and contact cueing to ensure safety and quality of movement that is agreeable with surgical protocol.  Supine wand R shoulder AAROM press-ups/ shoulder flexion/shoulder ER/shoulder abduction within limitations of protocol; R shoulder flexion, extension, abduction, adduction, IR, and ER isometrics with manual resistance from therapist 5-second hold x10 each direction; Supine R shoulder serratus punch against gravity 2 x 10; Supine right shoulder rhythmic stabilization 30 seconds x 2; L sidelying R shoulder isometric manually resisted scapular retraction 5s hold x 10; Seated R shoulder AAROM flexion with cane x 10, verbal and tactile cues for scapular retraction; Prone R shoulder extension to neutral x 10, no pain when range limited to mid-axillary line; Prone R shoulder horizontal abduction with straight elbow x 2, discontinued secondary to pain; Patient encouraged to perform isometric right shoulder strengthening at home in addition to active assisted range of motion;     Manual: Gentle R shoulder PROM in  all directions within restrictions of protocol, pain, and never pushing through resistance; STM in supine R biceps, brachialis, anterior deltoid, and lateral deltoid. In L sidelying performed STM to posterior deltoid, supraspinatus, and infraspinatus; Scar massage performed to right anterior shoulder with education patient regarding performance at home; R shoulder ice 10 min skin inspection resulted in mild redness without any adverse signs of skin issue (unbilled);    Pt educated throughout session about proper posture and technique with exercises. Improved exercise technique, movement at target joints, use of target  muscles after min to mod verbal, visual, tactile cues.      Patient continues to report soreness/pain in the posterior aspect of her L shoulder. STM performed today to decrease pain to anterior, lateral, and posterior shoulder.  Also perform scar massage to right anterior shoulder with education patient about performing at home.  Continued with light AAROM with cane as well as gentle PROM.  Progressed right shoulder isometric strengthening and patient encouraged to perform at home.  Patient denies any increase in pain in session but does have notable fatigue. R shoulder external rotation remains very limited but is slightly better than the last time this therapist worked with patient especially at 90 degrees of abduction. Patient is progressing toward functional ROM and strength in R shoulder and continues need for skilled services.                         PT Education - 06/03/21 0758     Education Details Pt educated on proper DOMS after tissue loading for optimal rehab.    Person(s) Educated Patient    Methods Explanation    Comprehension Verbalized understanding                 PT Long Term Goals - 05/25/21 0943       PT LONG TERM GOAL #1   Title Pt. I with HEP to increase R shoulder AROM to [facility] as compared to L shoulder to improve pain-free mobility.    Baseline R sh. limited to 90 deg. flexion/ 45 deg. abduction/ 1 deg. ER per MD protocol. 7/13: Abd 90 deg flex 110 deg. ER 1    Time 8    Period Weeks    Status Partially Met    Target Date 06/22/21      PT LONG TERM GOAL #2   Title Pt. will increase R deltoid/ biceps/ triceps strength to grossly 4/5 MMT to improve return to household tasks/ running.    Baseline No R shoulder MMT testing per MD protocol 7/13: R shoulder Flex 4-/5 abd 4-/5 ER 4-/5 IR 4-/5    Time 8    Period Weeks    Status New      PT LONG TERM GOAL #3   Title Pt. will increase FOTO to 60 to improve R shoulder mobility.    Baseline  FOTO: baseline 29 7/13: 56    Time 8    Period Weeks    Status Partially Met      PT LONG TERM GOAL #4   Title Pt. able to complete all work related tasks with no R shoulder limitations or pain to promote return to PLOF.    Baseline Pt. has not returned to work at this time due to R shoulder limitations. 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    Time 8    Period Weeks    Status New  Plan - 06/03/21 0802     Clinical Impression Statement Pt displayed good tolerance with min/mod reports of pain located in the post rotator cuff 2/2 poor muscle stretngth. All tx was agreeable to surgical protocol provided.  Pt continues to display R shoulder ROM, Strength, and endurance deficits that results in decrease activity tolerance during home/community related task. Pt. will continue to benefit from skilled physical therapy to progress POC to address remaining deficits to facilitate maximum functional capacity for optimal personal health and wellness for ADLs    Personal Factors and Comorbidities Fitness    Stability/Clinical Decision Making Stable/Uncomplicated    Clinical Decision Making Low    Rehab Potential Good    PT Frequency 2x / week    PT Duration 8 weeks    PT Treatment/Interventions ADLs/Self Care Home Management;Aquatic Therapy;Cryotherapy;Electrical Stimulation;Therapeutic exercise;Patient/family education;Neuromuscular re-education;Manual techniques;Scar mobilization;Passive range of motion;Functional mobility training    PT Next Visit Plan Follow MD protocol.  Pt. currently in Phase I/II (wk 6 s/p surgery).    PT Home Exercise Plan See handouts    Consulted and Agree with Plan of Care Patient             Patient will benefit from skilled therapeutic intervention in order to improve the following deficits and impairments:  Pain, Decreased mobility, Decreased scar mobility, Hypomobility, Decreased strength, Decreased range of motion, Decreased endurance, Decreased  activity tolerance, Impaired flexibility  Visit Diagnosis: Shoulder joint stiffness, right  Muscle weakness (generalized)  Acute pain of right shoulder     Problem List Patient Active Problem List   Diagnosis Date Noted   Generalized abdominal pain 06/19/2018   Nausea and vomiting 06/19/2018   Loss of weight 06/19/2018   Headache 12/18/2012   Hypothermia 12/18/2012   This entire session was performed under direct supervision and direction of a licensed therapist/therapist assistant . I have personally read, edited and approve of the note as written.   Fara Olden SPT Phillips Grout PT, DPT, GCS  06/03/2021, 12:11 PM  [facility] [address] [place], [place], [address] Phone: [phone]   Fax:  [phone]  Name: Sara Gomez MRN: [id number] Date of Birth: 1996/06/26

## 2021-06-06 ENCOUNTER — Other Ambulatory Visit: Payer: Self-pay

## 2021-06-06 ENCOUNTER — Encounter: Payer: Self-pay | Admitting: Physical Therapy

## 2021-06-06 ENCOUNTER — Ambulatory Visit: Payer: 59 | Admitting: Physical Therapy

## 2021-06-06 DIAGNOSIS — M25511 Pain in right shoulder: Secondary | ICD-10-CM

## 2021-06-06 DIAGNOSIS — M25611 Stiffness of right shoulder, not elsewhere classified: Secondary | ICD-10-CM

## 2021-06-06 DIAGNOSIS — M6281 Muscle weakness (generalized): Secondary | ICD-10-CM

## 2021-06-06 NOTE — Therapy (Signed)
[place] [facility] [address] [place], [place], [address] Phone: [phone]   Fax:  [phone]  Physical Therapy Treatment  Patient Details  Name: Sara Gomez MRN: [id number] Date of Birth: 05-04-1996 Referring Provider (PT): Dr. Jeannie Fend   Encounter Date: 06/06/2021   PT End of Session - 06/06/21 0832     Visit Number 14    Number of Visits 17    Date for PT Re-Evaluation 06/22/21    Authorization Type BCBS COMM Pro (primary); UHC (secondary); visit limits based on necessity    Authorization Time Period 04/27/21- 06/22/21    Authorization - Visit Number 4    Authorization - Number of Visits 10    PT Start Time 0725    PT Stop Time 0809    PT Time Calculation (min) 44 min    Activity Tolerance Patient tolerated treatment well    Behavior During Therapy [facility] for tasks assessed/performed             Past Medical History:  Diagnosis Date   Abdominal pain 05/23/2018   Nausea and vomiting 05/23/2018   Per [place] Hepato w Marjean Donna order    Past Surgical History:  Procedure Laterality Date   COLONOSCOPY  06/07/2018   Dr Alice Reichert   SHOULDER ARTHROSCOPY     x2   SHOULDER LATERJET     x3   TONSILLECTOMY  2005   UPPER GI ENDOSCOPY  06/07/2018   Dr Alice Reichert   WISDOM TOOTH EXTRACTION      There were no vitals filed for this visit.   Subjective Assessment - 06/06/21 0817     Subjective Pt stated that she has R global shoulder pain and 8/10 headache upon arrival. Pt took medication for headache.    Pertinent History Pt presented to session reporting having bad migraine last night. Pt reports she has been driving since doctor's clearance last Friday. R shoulder feels stiff but no pain resting 3/10 pain with active ROM.    Limitations Lifting;Standing;Writing;House hold activities    Patient Stated Goals Increase R shoulder ROM/ strength to improve pain-free mobility.    Currently in Pain? Yes    Pain Score 5     Pain Location  Shoulder    Pain Orientation Right    Pain Descriptors / Indicators Aching    Pain Type Chronic pain;Surgical pain    Pain Onset 1 to 4 weeks ago               TREATMENT     Ther-ex  Supine exercises with verbal and contact cueing to ensure safety and quality of movement that is agreeable with surgical protocol.   1.Supine wand R shoulder AAROM press-ups (unable to perform d/t pain)   2.Supine: R shoulder flexion, extension, abduction, adduction, IR, and ER isometrics with manual resistance from therapist 5-second hold x10 each direction;  3.Supine R shoulder serratus punch against gravity 2 x 10;  4. Supine right shoulder rhythmic stabilization 30 seconds x 2;(unable to perform d/t pain)  5. Prone R shoulder extension to neutral x 10, no pain when range limited to mid-axillary line;  6. Prone R shoulder horizontal abduction with straight elbow x 2, discontinued secondary to pain;  Ice to neck for headache relief x15 min )not billed) Patient encouraged to perform isometric right shoulder strengthening at home in addition to active assisted range of motion;     Manual: Gentle R shoulder PROM in all directions within restrictions of protocol,  pain, and never pushing through resistance; STM in supine R biceps, brachialis, anterior deltoid, and lateral deltoid. Prone, Active trigger point release performed STM to posterior deltoid, supraspinatus, and infraspinatus with shoulder AROM to flexion.  R shoulder ice 10 min skin inspection resulted in mild redness without any adverse signs of skin issue (unbilled);       PT Long Term Goals - 05/25/21 0943       PT LONG TERM GOAL #1   Title Pt. I with HEP to increase R shoulder AROM to [facility] as compared to L shoulder to improve pain-free mobility.    Baseline R sh. limited to 90 deg. flexion/ 45 deg. abduction/ 1 deg. ER per MD protocol. 7/13: Abd 90 deg flex 110 deg. ER 1    Time 8    Period Weeks    Status Partially Met     Target Date 06/22/21      PT LONG TERM GOAL #2   Title Pt. will increase R deltoid/ biceps/ triceps strength to grossly 4/5 MMT to improve return to household tasks/ running.    Baseline No R shoulder MMT testing per MD protocol 7/13: R shoulder Flex 4-/5 abd 4-/5 ER 4-/5 IR 4-/5    Time 8    Period Weeks    Status New      PT LONG TERM GOAL #3   Title Pt. will increase FOTO to 60 to improve R shoulder mobility.    Baseline FOTO: baseline 29 7/13: 56    Time 8    Period Weeks    Status Partially Met      PT LONG TERM GOAL #4   Title Pt. able to complete all work related tasks with no R shoulder limitations or pain to promote return to PLOF.    Baseline Pt. has not returned to work at this time due to R shoulder limitations. 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    Time 8    Period Weeks    Status New                   Plan - 06/06/21 9604     Clinical Impression Statement Patient continues to report soreness/pain to global R shoulder. STM performed today to decrease pain to anterior, lateral, and posterior shoulder along with isometric manual hold, no increased in pain. Pain limited pt to perform light AAROM with cane. Gentle PROM stretch was able to achieve to approximately 125 degree this session. R shoulder external rotation remains very limited but is slightly better than the last time this therapist worked with patient especially at 90 degrees of abduction. Patient is progressing toward functional ROM and strength in R shoulder and continues need for skilled services.    Personal Factors and Comorbidities Fitness    Stability/Clinical Decision Making Stable/Uncomplicated    Clinical Decision Making Low    Rehab Potential Good    PT Frequency 2x / week    PT Duration 8 weeks    PT Treatment/Interventions ADLs/Self Care Home Management;Aquatic Therapy;Cryotherapy;Electrical Stimulation;Therapeutic exercise;Patient/family education;Neuromuscular re-education;Manual  techniques;Scar mobilization;Passive range of motion;Functional mobility training    PT Next Visit Plan Follow MD protocol.  Pt. currently in Phase I/II (wk 7 s/p surgery).    PT Home Exercise Plan See handouts    Consulted and Agree with Plan of Care Patient             Patient will benefit from skilled therapeutic intervention in order to improve the following deficits  and impairments:  Pain, Decreased mobility, Decreased scar mobility, Hypomobility, Decreased strength, Decreased range of motion, Decreased endurance, Decreased activity tolerance, Impaired flexibility  Visit Diagnosis: Shoulder joint stiffness, right  Muscle weakness (generalized)  Acute pain of right shoulder     Problem List Patient Active Problem List   Diagnosis Date Noted   Generalized abdominal pain 06/19/2018   Nausea and vomiting 06/19/2018   Loss of weight 06/19/2018   Headache 12/18/2012   Hypothermia 12/18/2012   Pura Spice, PT, DPT # [id number] Shirley Friar, SPT 06/06/2021, 8:37 AM  [place] [facility] [address] [place], [place], [address] Phone: [phone]   Fax:  [phone]  Name: Sara Gomez MRN: [id number] Date of Birth: 1996/01/11

## 2021-06-08 ENCOUNTER — Ambulatory Visit: Payer: 59 | Admitting: Physical Therapy

## 2021-06-08 ENCOUNTER — Other Ambulatory Visit: Payer: Self-pay

## 2021-06-08 ENCOUNTER — Encounter: Payer: Self-pay | Admitting: Physical Therapy

## 2021-06-08 DIAGNOSIS — M6281 Muscle weakness (generalized): Secondary | ICD-10-CM

## 2021-06-08 DIAGNOSIS — M25611 Stiffness of right shoulder, not elsewhere classified: Secondary | ICD-10-CM | POA: Diagnosis not present

## 2021-06-08 DIAGNOSIS — M25511 Pain in right shoulder: Secondary | ICD-10-CM

## 2021-06-08 NOTE — Therapy (Signed)
[facility] [address] [place], [place], [address] Phone: [phone]   Fax:  [phone]  Physical Therapy Treatment  Patient Details  Name: Sara Gomez MRN: [id number] Date of Birth: 11/09/1996 Referring Provider (PT): Dr. Jeannie Fend   Encounter Date: 06/08/2021   PT End of Session - 06/08/21 0923     Visit Number 15    Number of Visits 17    Date for PT Re-Evaluation 06/22/21    Authorization Type BCBS COMM Pro (primary); U[employer] (secondary); visit limits based on necessity    Authorization Time Period 04/27/21- 06/22/21    Authorization - Visit Number 5    Authorization - Number of Visits 10    PT Start Time 0810    PT Stop Time 0856    PT Time Calculation (min) 46 min    Activity Tolerance Patient tolerated treatment well    Behavior During Therapy [facility] for tasks assessed/performed             Past Medical History:  Diagnosis Date   Abdominal pain 05/23/2018   Nausea and vomiting 05/23/2018   Per NM Hepato w Marjean Donna order    Past Surgical History:  Procedure Laterality Date   COLONOSCOPY  06/07/2018   Dr Alice Reichert   SHOULDER ARTHROSCOPY     x2   SHOULDER LATERJET     x3   TONSILLECTOMY  2005   UPPER GI ENDOSCOPY  06/07/2018   Dr Alice Reichert   WISDOM TOOTH EXTRACTION      There were no vitals filed for this visit.   Subjective Assessment - 06/08/21 0847     Subjective Pt stated that her headache resolved and R shoulder was sore d/t heavy usage.    Pertinent History Pt presented to session reporting having bad migraine last night. Pt reports she has been driving since doctor's clearance last Friday. R shoulder feels stiff but no pain resting 3/10 pain with active ROM.    Limitations Lifting;Standing;Writing;House hold activities    Patient Stated Goals Increase R shoulder ROM/ strength to improve pain-free mobility.    Currently in Pain? Yes    Pain Score 4     Pain Location Shoulder    Pain Orientation  Right    Pain Descriptors / Indicators Aching    Pain Type Chronic pain;Surgical pain    Pain Onset 1 to 4 weeks ago    Pain Frequency Intermittent                 Ther-ex   Supine exercises with verbal and contact cueing to ensure safety and quality of movement that is agreeable with surgical protocol.  1)Supine wand R shoulder AAROM press-ups/ shoulder flexion/shoulder ER/shoulder abduction within limitations of protocol;  2)Supine R shoulder serratus punch against gravity 2 x 10;  3)Supine right shoulder 90 degree flexion rhythmic stabilization 30 seconds x 2;  4) SL right shoulder 90 degree abduction rhythmic stabilization 30 seconds x 2;  5) L sidelying R shoulder isometric manually resisted scapular retraction 5s hold x 10;  6)Seated R shoulder AROM flexion/abduction x 10 each, verbal and tactile cues for scapular retraction;  7)Prone R shoulder row to neutral x10 with 2# DB  8) pulley AAROM o end ROM flexion. Educated on performing at home.   Prone R shoulder horizontal abduction with straight elbow x 5, Patient encouraged to perform isometric right shoulder strengthening at home in addition to active assisted range of motion;  Manual: Gentle R shoulder PROM in all directions within restrictions of protocol, pain, and never pushing through resistance; STM in sitting to R infraspinatus, teres minor, with AROM to flexion. Prone: R rhomboid, upper trap, levator scap.  Pt educated throughout session about proper posture and technique with exercises. Improved exercise technique, movement at target joints, use of target muscles after min to mod verbal, visual, tactile cues.    PT Long Term Goals - 05/25/21 0943       PT LONG TERM GOAL #1   Title Pt. I with HEP to increase R shoulder AROM to [facility] as compared to L shoulder to improve pain-free mobility.    Baseline R sh. limited to 90 deg. flexion/ 45 deg. abduction/ 1 deg. ER per MD protocol. 7/13: Abd 90 deg flex  110 deg. ER 1    Time 8    Period Weeks    Status Partially Met    Target Date 06/22/21      PT LONG TERM GOAL #2   Title Pt. will increase R deltoid/ biceps/ triceps strength to grossly 4/5 MMT to improve return to household tasks/ running.    Baseline No R shoulder MMT testing per MD protocol 7/13: R shoulder Flex 4-/5 abd 4-/5 ER 4-/5 IR 4-/5    Time 8    Period Weeks    Status New      PT LONG TERM GOAL #3   Title Pt. will increase FOTO to 60 to improve R shoulder mobility.    Baseline FOTO: baseline 29 7/13: 56    Time 8    Period Weeks    Status Partially Met      PT LONG TERM GOAL #4   Title Pt. able to complete all work related tasks with no R shoulder limitations or pain to promote return to PLOF.    Baseline Pt. has not returned to work at this time due to R shoulder limitations. 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    Time 8    Period Weeks    Status New                   Plan - 06/08/21 0865     Clinical Impression Statement Session focused on progressive strengthening and manual therapy to promote tissue extensibility. Pt tolerated R shoulder AROM against gravity in supine and sitting with slight increased pain to 4/10. Trigger point release to R infraspinatus and teres minor with movement to flexion was helpful to improve shoulder AROM to approximately 120 degree. Pt prescribed with a pulley to perform AAROM to facilitate improvement of ROM.    Personal Factors and Comorbidities Fitness    Stability/Clinical Decision Making Stable/Uncomplicated    Clinical Decision Making Low    Rehab Potential Good    PT Frequency 2x / week    PT Duration 8 weeks    PT Treatment/Interventions ADLs/Self Care Home Management;Aquatic Therapy;Cryotherapy;Electrical Stimulation;Therapeutic exercise;Patient/family education;Neuromuscular re-education;Manual techniques;Scar mobilization;Passive range of motion;Functional mobility training    PT Next Visit Plan Follow MD protocol.  Pt.  currently in Phase I/II (wk 7 s/p surgery).    PT Home Exercise Plan See handouts    Consulted and Agree with Plan of Care Patient             Patient will benefit from skilled therapeutic intervention in order to improve the following deficits and impairments:  Pain, Decreased mobility, Decreased scar mobility, Hypomobility, Decreased strength, Decreased range of motion, Decreased endurance, Decreased activity  tolerance, Impaired flexibility  Visit Diagnosis: Shoulder joint stiffness, right  Muscle weakness (generalized)  Acute pain of right shoulder     Problem List Patient Active Problem List   Diagnosis Date Noted   Generalized abdominal pain 06/19/2018   Nausea and vomiting 06/19/2018   Loss of weight 06/19/2018   Headache 12/18/2012   Hypothermia 12/18/2012   Pura Spice, PT, DPT # [id number] Shirley Friar, SPT 06/08/2021, 10:21 AM  [facility] [address] [place], [place], [address] Phone: [phone]   Fax:  [phone]  Name: Sara Gomez MRN: [id number] Date of Birth: 1996-03-10

## 2021-06-09 ENCOUNTER — Encounter: Payer: Self-pay | Admitting: Physical Therapy

## 2021-06-09 ENCOUNTER — Ambulatory Visit: Payer: 59 | Admitting: Physical Therapy

## 2021-06-09 DIAGNOSIS — G8929 Other chronic pain: Secondary | ICD-10-CM

## 2021-06-09 DIAGNOSIS — M25512 Pain in left shoulder: Secondary | ICD-10-CM

## 2021-06-09 DIAGNOSIS — M25511 Pain in right shoulder: Secondary | ICD-10-CM

## 2021-06-09 DIAGNOSIS — M25612 Stiffness of left shoulder, not elsewhere classified: Secondary | ICD-10-CM

## 2021-06-09 DIAGNOSIS — M25611 Stiffness of right shoulder, not elsewhere classified: Secondary | ICD-10-CM | POA: Diagnosis not present

## 2021-06-09 DIAGNOSIS — M6281 Muscle weakness (generalized): Secondary | ICD-10-CM

## 2021-06-09 NOTE — Therapy (Signed)
[facility] [address] [place], [place], [address] Phone: [phone]   Fax:  [phone]  Physical Therapy Treatment  Patient Details  Name: Sara Gomez MRN: [id number] Date of Birth: 1996/11/04 Referring Provider (PT): Dr. Jeannie Fend   Encounter Date: 06/09/2021   PT End of Session - 06/09/21 0734     Visit Number 16    Number of Visits 17    Date for PT Re-Evaluation 06/22/21    Authorization Type [employer] COMM Pro (primary); U[employer] (secondary); visit limits based on necessity    Authorization Time Period 04/27/21- 06/22/21    Authorization - Visit Number 6    Authorization - Number of Visits 10    PT Start Time 0731    PT Stop Time 0816    PT Time Calculation (min) 45 min    Activity Tolerance Patient tolerated treatment well    Behavior During Therapy [facility] for tasks assessed/performed             Past Medical History:  Diagnosis Date   Abdominal pain 05/23/2018   Nausea and vomiting 05/23/2018   Per NM Hepato w Marjean Donna order    Past Surgical History:  Procedure Laterality Date   COLONOSCOPY  06/07/2018   Dr Alice Reichert   SHOULDER ARTHROSCOPY     x2   SHOULDER LATERJET     x3   TONSILLECTOMY  2005   UPPER GI ENDOSCOPY  06/07/2018   Dr Alice Reichert   WISDOM TOOTH EXTRACTION      There were no vitals filed for this visit.   Subjective Assessment - 06/09/21 0729     Subjective Pt present to tx with slight increase in R shoulder pain (4/10). Pt states she is upset with her current housing situation in [place].  Pt. starts working tomorrow at [employer] job.    Pertinent History Pt presented to session reporting having bad migraine last night. Pt reports she has been driving since doctor's clearance last Friday. R shoulder feels stiff but no pain resting 3/10 pain with active ROM.    Limitations Lifting;Standing;Writing;House hold activities    Patient Stated Goals Increase R shoulder ROM/ strength to improve pain-free  mobility.    Currently in Pain? Yes    Pain Score 4     Pain Location Shoulder    Pain Orientation Right    Pain Descriptors / Indicators Aching    Pain Type Chronic pain;Surgical pain    Pain Onset 1 to 4 weeks ago    Pain Frequency Intermittent                Ther-ex    Supine exercises with verbal and contact cueing to ensure safety and quality of movement that is agreeable with surgical protocol.  1)Supine wand R shoulder AAROM press-ups/ shoulder flexion/shoulder ER/shoulder abduction within limitations of protocol;    2) Therapist resisted 5-8sec isometric/pertubation  holds in all direction with shoulder at 90 degs of flexion. Pt was was positioned in supine. 2 mins x 4. Pt requires prolonged rest 2/2 quickly fatiguing muscle stabilizers       Manual:  Gentle R shoulder PROM in all directions within restrictions of protocol, pain, and never pushing through resistance; STM in sitting to R infraspinatus, teres minor, with AROM to flexion. Prone: R rhomboid, upper trap, levator scap.   Pt educated throughout session about proper posture and technique with exercises. Improved exercise technique, movement at target joints, use of target muscles after min  to mod verbal, visual, tactile cues.          PT Long Term Goals - 05/25/21 0943       PT LONG TERM GOAL #1   Title Pt. I with HEP to increase R shoulder AROM to [facility] as compared to L shoulder to improve pain-free mobility.    Baseline R sh. limited to 90 deg. flexion/ 45 deg. abduction/ 1 deg. ER per MD protocol. 7/13: Abd 90 deg flex 110 deg. ER 1    Time 8    Period Weeks    Status Partially Met    Target Date 06/22/21      PT LONG TERM GOAL #2   Title Pt. will increase R deltoid/ biceps/ triceps strength to grossly 4/5 MMT to improve return to household tasks/ running.    Baseline No R shoulder MMT testing per MD protocol 7/13: R shoulder Flex 4-/5 abd 4-/5 ER 4-/5 IR 4-/5    Time 8    Period Weeks     Status New      PT LONG TERM GOAL #3   Title Pt. will increase FOTO to 60 to improve R shoulder mobility.    Baseline FOTO: baseline 29 7/13: 56    Time 8    Period Weeks    Status Partially Met      PT LONG TERM GOAL #4   Title Pt. able to complete all work related tasks with no R shoulder limitations or pain to promote return to PLOF.    Baseline Pt. has not returned to work at this time due to R shoulder limitations. 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    Time 8    Period Weeks    Status New                   Plan - 06/09/21 0753     Clinical Impression Statement Pt displayed minor decrease in treatment tolerance 2/2 mod emotional stress over the past 24 hours. Pt presented to tx with 4/10 and significant R shoulder stiffness. Pt measured 130 deg of R shoulder flex PROM at the start of tx. Pt displayed increase in R shoulder flex to 135 post STM and prolonged stretching with decrease stiffness and minor reduction in pain. Pt continues to display R shoulder strength, endurance, and ROM deficits. Pt. will continue to benefit from skilled physical therapy to progress POC to address remaining deficits to facilitate maximum functional capacity for optimal personal health and wellness for ADLs.    Personal Factors and Comorbidities Fitness    Stability/Clinical Decision Making Stable/Uncomplicated    Clinical Decision Making Low    Rehab Potential Good    PT Frequency 2x / week    PT Duration 8 weeks    PT Treatment/Interventions ADLs/Self Care Home Management;Aquatic Therapy;Cryotherapy;Electrical Stimulation;Therapeutic exercise;Patient/family education;Neuromuscular re-education;Manual techniques;Scar mobilization;Passive range of motion;Functional mobility training    PT Next Visit Plan Follow MD protocol.  Pt. currently in Phase I/II (wk 7 s/p surgery).    PT Home Exercise Plan See handouts    Consulted and Agree with Plan of Care Patient             Patient will benefit  from skilled therapeutic intervention in order to improve the following deficits and impairments:  Pain, Decreased mobility, Decreased scar mobility, Hypomobility, Decreased strength, Decreased range of motion, Decreased endurance, Decreased activity tolerance, Impaired flexibility  Visit Diagnosis: Shoulder joint stiffness, right  Shoulder joint stiffness, left  Muscle weakness (  generalized)  Acute pain of right shoulder  Chronic left shoulder pain     Problem List Patient Active Problem List   Diagnosis Date Noted   Generalized abdominal pain 06/19/2018   Nausea and vomiting 06/19/2018   Loss of weight 06/19/2018   Headache 12/18/2012   Hypothermia 12/18/2012   Pura Spice, PT, DPT # [id number] Fara Olden, SPT 06/09/2021, 9:01 AM  [place] [address]. [place], [place], [address] Phone: [phone]   Fax:  [phone]  Name: Analina Filla Nichol MRN: [id number] Date of Birth: 1996/11/03

## 2021-06-13 ENCOUNTER — Other Ambulatory Visit: Payer: Self-pay

## 2021-06-13 ENCOUNTER — Encounter: Payer: Self-pay | Admitting: Physical Therapy

## 2021-06-13 ENCOUNTER — Ambulatory Visit: Payer: 59 | Attending: Orthopedic Surgery | Admitting: Physical Therapy

## 2021-06-13 DIAGNOSIS — M6281 Muscle weakness (generalized): Secondary | ICD-10-CM | POA: Diagnosis present

## 2021-06-13 DIAGNOSIS — M25612 Stiffness of left shoulder, not elsewhere classified: Secondary | ICD-10-CM | POA: Insufficient documentation

## 2021-06-13 DIAGNOSIS — M25511 Pain in right shoulder: Secondary | ICD-10-CM | POA: Insufficient documentation

## 2021-06-13 DIAGNOSIS — G8929 Other chronic pain: Secondary | ICD-10-CM | POA: Insufficient documentation

## 2021-06-13 DIAGNOSIS — M25611 Stiffness of right shoulder, not elsewhere classified: Secondary | ICD-10-CM | POA: Diagnosis present

## 2021-06-13 DIAGNOSIS — M25512 Pain in left shoulder: Secondary | ICD-10-CM | POA: Insufficient documentation

## 2021-06-13 NOTE — Therapy (Signed)
[place] [facility] [address] [place], [place], [address] Phone: [phone]   Fax:  [phone]  Physical Therapy Treatment  Patient Details  Name: Sara Gomez MRN: [id number] Date of Birth: Jun 20, 1996 Referring Provider (PT): Dr. Jeannie Fend   Encounter Date: 06/13/2021   PT End of Session - 06/13/21 0825     Visit Number 17    Number of Visits 17    Date for PT Re-Evaluation 06/22/21    Authorization Type BCBS COMM Pro (primary); UHC (secondary); visit limits based on necessity    Authorization Time Period 04/27/21- 06/22/21    Authorization - Visit Number 7    Authorization - Number of Visits 10    PT Start Time 0732    PT Stop Time 0821    PT Time Calculation (min) 49 min    Activity Tolerance Patient tolerated treatment well    Behavior During Therapy [facility] for tasks assessed/performed             Past Medical History:  Diagnosis Date   Abdominal pain 05/23/2018   Nausea and vomiting 05/23/2018   Per NM Hepato w Marjean Donna order    Past Surgical History:  Procedure Laterality Date   COLONOSCOPY  06/07/2018   Dr Alice Reichert   SHOULDER ARTHROSCOPY     x2   SHOULDER LATERJET     x3   TONSILLECTOMY  2005   UPPER GI ENDOSCOPY  06/07/2018   Dr Alice Reichert   WISDOM TOOTH EXTRACTION      There were no vitals filed for this visit.   Subjective Assessment - 06/13/21 0732     Subjective Pt presents to tx with R shoulder pain 2/10. 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 Pt stated moderate soreness in LE, however, only mild soreness in R shoulder. 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    Pertinent History Pt presented to session reporting having bad migraine last night. Pt reports she has been driving since doctor's clearance last Friday. R shoulder feels stiff but no pain resting 3/10 pain with active ROM.    Limitations Lifting;Standing;Writing;House  hold activities    Patient Stated Goals Increase R shoulder ROM/ strength to improve pain-free mobility.    Currently in Pain? Yes    Pain Score 2     Pain Location Shoulder    Pain Orientation Right    Pain Descriptors / Indicators Aching    Pain Type Chronic pain;Surgical pain    Pain Onset 1 to 4 weeks ago               Ther-ex:   Supine exercises with verbal and contact cueing to ensure safety and quality of movement that is agreeable with surgical protocol.   1)Supine wand R shoulder AAROM press-ups/ shoulder flexion/shoulder ER/shoulder abduction within limitations of protocol;   2) Therapist resisted 5-8sec isometric/pertubation  holds in all direction with shoulder at 90 degs of flexion. Pt was was positioned in supine. 2 mins x 4. Pt requires prolonged rest 2/2 quickly fatiguing muscle stabilizers       Manual:   Gentle R shoulder PROM in all directions within restrictions of protocol, pain, and never pushing through resistance; STM in sitting to R infraspinatus, teres minor, with AROM to flexion. Prone: CPA/UPA general thoracic and STM to thoracic paraspinal to reduce upper posterior chain tightness.    Pt educated  throughout session about proper posture and technique with exercises. Improved exercise technique, movement at target joints, use of target muscles after min to mod verbal, visual, tactile cues.     Reassessment: R shoulder flexion AROM 130 PROM 140 deg.        PT Education - 06/13/21 0824     Education Details Pt educated on light return to ALDs with RUE without increased in pain.    Person(s) Educated Patient    Methods Explanation    Comprehension Verbalized understanding                 PT Long Term Goals - 05/25/21 0943       PT LONG TERM GOAL #1   Title Pt. I with HEP to increase R shoulder AROM to [facility] as compared to L shoulder to improve pain-free mobility.    Baseline R sh. limited to 90 deg. flexion/ 45 deg. abduction/ 1  deg. ER per MD protocol. 7/13: Abd 90 deg flex 110 deg. ER 1    Time 8    Period Weeks    Status Partially Met    Target Date 06/22/21      PT LONG TERM GOAL #2   Title Pt. will increase R deltoid/ biceps/ triceps strength to grossly 4/5 MMT to improve return to household tasks/ running.    Baseline No R shoulder MMT testing per MD protocol 7/13: R shoulder Flex 4-/5 abd 4-/5 ER 4-/5 IR 4-/5    Time 8    Period Weeks    Status New      PT LONG TERM GOAL #3   Title Pt. will increase FOTO to 60 to improve R shoulder mobility.    Baseline FOTO: baseline 29 7/13: 56    Time 8    Period Weeks    Status Partially Met      PT LONG TERM GOAL #4   Title Pt. able to complete all work related tasks with no R shoulder limitations or pain to promote return to PLOF.    Baseline Pt. has not returned to work at this time due to R shoulder limitations. 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    Time 8    Period Weeks    Status New                   Plan - 06/13/21 6468     Clinical Impression Statement Pt displayed reduction in R shoulder/upper thoracic tightness post manual/therex intervention. Pt stated no adherence to shoulder pulley or HEP during non-clinic time. Pt continues to display weakness and fatigue in the R shoulder leading to decrease activity tolerance during home and vocation related tasks. Pt display R shoulder flexion AROM 130 PROM 140. Pt. will continue to benefit from skilled physical therapy to progress POC to address remaining deficits to facilitate maximum functional capacity for optimal personal health and wellness for ADLs.    Personal Factors and Comorbidities Fitness    Stability/Clinical Decision Making Stable/Uncomplicated    Clinical Decision Making Low    Rehab Potential Good    PT Frequency 2x / week    PT Duration 8 weeks    PT Treatment/Interventions ADLs/Self Care Home Management;Aquatic Therapy;Cryotherapy;Electrical Stimulation;Therapeutic  exercise;Patient/family education;Neuromuscular re-education;Manual techniques;Scar mobilization;Passive range of motion;Functional mobility training    PT Next Visit Plan Follow MD protocol.  Update    PT Home Exercise Plan See handouts    Consulted and Agree with Plan of Care Patient  Patient will benefit from skilled therapeutic intervention in order to improve the following deficits and impairments:  Pain, Decreased mobility, Decreased scar mobility, Hypomobility, Decreased strength, Decreased range of motion, Decreased endurance, Decreased activity tolerance, Impaired flexibility  Visit Diagnosis: Shoulder joint stiffness, right  Chronic left shoulder pain  Shoulder joint stiffness, left  Muscle weakness (generalized)  Acute pain of right shoulder     Problem List Patient Active Problem List   Diagnosis Date Noted   Generalized abdominal pain 06/19/2018   Nausea and vomiting 06/19/2018   Loss of weight 06/19/2018   Headache 12/18/2012   Hypothermia 12/18/2012   Pura Spice, PT, DPT # [id number] Fara Olden SPT 06/13/2021, 9:08 AM  [place] [facility] [address] [place], [place], [address] Phone: [phone]   Fax:  [phone]  Name: Sharetha Newson Iams MRN: [id number] Date of Birth: [birth year]

## 2021-06-15 ENCOUNTER — Ambulatory Visit: Payer: 59 | Admitting: Physical Therapy

## 2021-06-15 ENCOUNTER — Other Ambulatory Visit: Payer: Self-pay

## 2021-06-15 ENCOUNTER — Encounter: Payer: Self-pay | Admitting: Physical Therapy

## 2021-06-15 DIAGNOSIS — G8929 Other chronic pain: Secondary | ICD-10-CM

## 2021-06-15 DIAGNOSIS — M25612 Stiffness of left shoulder, not elsewhere classified: Secondary | ICD-10-CM

## 2021-06-15 DIAGNOSIS — M25611 Stiffness of right shoulder, not elsewhere classified: Secondary | ICD-10-CM

## 2021-06-15 DIAGNOSIS — M25511 Pain in right shoulder: Secondary | ICD-10-CM

## 2021-06-15 DIAGNOSIS — M6281 Muscle weakness (generalized): Secondary | ICD-10-CM

## 2021-06-15 NOTE — Therapy (Signed)
[place] [facility] [address] [place], [place], [address] Phone: [phone]   Fax:  [phone]  Physical Therapy Treatment  Patient Details  Name: Sara Gomez Cordial MRN: [id number] Date of Birth: Feb 25, 1996 Referring Provider (PT): Dr. Jeannie Fend   Encounter Date: 06/15/2021   PT End of Session - 06/15/21 0838     Visit Number 18    Number of Visits 30    Date for PT Re-Evaluation 07/13/21    Authorization Type BCBS COMM Pro (primary); [employer] (secondary); visit limits based on necessity    Authorization Time Period 04/27/21- 06/22/21    Authorization - Visit Number 8    Authorization - Number of Visits 10    PT Start Time 0731    PT Stop Time 0819    PT Time Calculation (min) 48 min    Activity Tolerance Patient tolerated treatment well    Behavior During Therapy [facility] for tasks assessed/performed             Past Medical History:  Diagnosis Date   Abdominal pain 05/23/2018   Nausea and vomiting 05/23/2018   Per NM Hepato w Marjean Donna order    Past Surgical History:  Procedure Laterality Date   COLONOSCOPY  06/07/2018   Dr Alice Reichert   SHOULDER ARTHROSCOPY     x2   SHOULDER LATERJET     x3   TONSILLECTOMY  2005   UPPER GI ENDOSCOPY  06/07/2018   Dr Alice Reichert   WISDOM TOOTH EXTRACTION      There were no vitals filed for this visit.   Subjective Assessment - 06/15/21 0731     Subjective Pt presents to tx with 4/10 R shoulder pain. Pt stated return to recreational swimming at a family member's house. Pt states the minor increase in R shoulder soreness could be due to sleeping on her R UE last night. Pt continues to report no adherence to HEP.    Pertinent History Pt presented to session reporting having bad migraine last night. Pt reports she has been driving since doctor's clearance last Friday. R shoulder feels stiff but no pain resting 3/10 pain with active ROM.    Limitations Lifting;Standing;Writing;House hold activities     Patient Stated Goals Increase R shoulder ROM/ strength to improve pain-free mobility.    Currently in Pain? Yes    Pain Score 4     Pain Location Shoulder    Pain Orientation Right    Pain Descriptors / Indicators Aching    Pain Onset 1 to 4 weeks ago             Reassessment:  MMT: L/R shoulder flexion: 4/4-             Abduction: 4/3+                       IR/ER: 4/3+  Grip strength: L 57.5#, R 54.1#   Pre-tx: R shoulder flexion 116 deg./ ER: 15 deg./ IR: 60 deg./ abduction 78 deg.  Post. tx.: R shoulder flexion:142 deg / ER:25 deg/ IR:60/ abduction: 109 deg     [facility] PT Assessment - 06/15/21 0001       Assessment   Medical Diagnosis s/p R shoulder surgery/ joint stiffness    Referring Provider (PT) Dr. Jeannie Fend    Onset Date/Surgical Date 04/07/21    Prior Therapy Yes, known well to PT clinic.      Prior Function   Level of Independence Independent  Cognition   Overall Cognitive Status Within Functional Limits for tasks assessed               Treatment:   Ther-ex:   Supine exercises with verbal and contact cueing to ensure safety and quality of movement that is agreeable with surgical protocol.    1)Supine wand R shoulder AAROM press-ups/ shoulder flexion/shoulder ER/shoulder abduction within limitations of protocol;   2) Therapist facilitated rhythmic stabilization 1 min bouts x3.  Pt was was positioned in supine. Pt requires prolonged rest 2/2 quickly fatiguing muscle stabilizers.  3) 3# chest press x10 with extensive verbal/contact cueing to ensure quality of movement. Pt displayed mark weakness during activity, however, no increase in pain.   4) 3# serratus anterior push in supine with contact cueing to ensure safety of movement. X10. Pt displayed mark weakness during activity, however, no increase in pain.   5) 3# shoulder ABCs, supine with 90 deg of shoulder flexion.   6) Short putting 57f disc golf throwing at ground target 6throws x 3.  Pt displayed no increase in pain after light throwing.    Manual:   Gentle R shoulder PROM in all directions within restrictions of protocol, pain, and never pushing through resistance; STM in sitting to R infraspinatus, teres minor, with AROM to flexion.    Pt educated throughout session about proper posture and technique with exercises. Improved exercise technique, movement at target joints, use of target muscles after min to mod verbal, visual, tactile cues.        PT Education - 06/15/21 0836     Education Details Pt educated on proper lifting mechanics during resistance exercise done in clinic.    Person(s) Educated Patient    Methods Explanation;Tactile cues;Verbal cues;Demonstration    Comprehension Verbalized understanding;Need further instruction                 PT Long Term Goals - 06/15/21 0843       PT LONG TERM GOAL #1   Title Pt. I with HEP to increase [facility] shoulder [facility] to L shoulder to improve pain-free mobility.    Baseline R sh. limited to 90 deg. flexion/ 45 deg. abduction/ 1 deg. ER per MD protocol. 7/13: Abd 90 deg flex 110 deg. ER 1 8/3: R shoulder flexion:142 deg / ER:25 deg/ IR:60/ abduction: 109 deg    Time 4    Period Weeks    Status Partially Met    Target Date 07/13/21      PT LONG TERM GOAL #2   Title Pt. will increase R deltoid/ biceps/ triceps strength to grossly 4/5 MMT to improve return to household tasks/ running.    Baseline No R shoulder MMT testing per MD protocol 7/13: R shoulder Flex 4-/5 abd 4-/5 ER 4-/5 IR 4-/5. 8/3: L/R shoulder flexion: 4/4-.  Abduction: 4/3+.  IR/ER: 4/3+.    Time 4    Period Weeks    Status Partially Met    Target Date 07/13/21      PT LONG TERM GOAL #3   Title Pt. will increase FOTO to 60 to improve R shoulder mobility.    Baseline FOTO: baseline 29 7/13: 56    Time 4    Period Weeks    Status Partially Met    Target Date 07/13/21      PT LONG TERM GOAL #4   Title Pt. able to  complete all work related tasks with no R shoulder limitations or pain  to promote return to PLOF.    Baseline Pt. has not returned to work at this time due to R shoulder limitations. 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 Pt needed extensive verbal/contact cueing to ensure safety of movement. Pt continues to display mark weakness of bilat UE 2/2 poor HEP adherence. L/R shoulder flexion: 4/4 Abduction: 4/3+ IR/ER: 4/3+. 4 additional weeks were added to pt POC for increase therapeutic benefit and reduced risk of a additional injury. Pt was able to display progression with all goals reassessed on the dated,however, no goals were completely met. FOTO deferred to later tx. Pt displayed increase in R shoulder ROM post manual intervention compared to before.  Pre-tx: R shoulder flexion 116 deg./ ER: 15 deg./ IR: 60 deg./ abduction 78 deg.  Post. tx.: R shoulder flexion:142 deg / ER:25 deg/ IR:60/ abduction: 109 deg.  Pt. will continue to benefit from skilled physical therapy to progress POC to address remaining deficits to facilitate maximum functional capacity for optimal personal health and wellness for ADLs.    Personal Factors and Comorbidities Fitness    Stability/Clinical Decision Making Stable/Uncomplicated    Clinical Decision Making Low    Rehab Potential Good    PT Frequency 3x / week    PT Duration 4 weeks    PT Treatment/Interventions ADLs/Self Care Home Management;Aquatic Therapy;Cryotherapy;Electrical Stimulation;Therapeutic exercise;Patient/family education;Neuromuscular re-education;Manual techniques;Scar mobilization;Passive range of motion;Functional mobility  training    PT Next Visit Plan Follow MD protocol.  Reassess FOTO    PT Home Exercise Plan See handouts    Consulted and Agree with Plan of Care Patient             Patient will benefit from skilled therapeutic intervention in order to improve the following deficits and impairments:  Pain, Decreased mobility, Decreased scar mobility, Hypomobility, Decreased strength, Decreased range of motion, Decreased endurance, Decreased activity tolerance, Impaired flexibility  Visit Diagnosis: Shoulder joint stiffness, right  Chronic left shoulder pain  Shoulder joint stiffness, left  Muscle weakness (generalized)  Acute pain of right shoulder     Problem List Patient Active Problem List   Diagnosis Date Noted   Generalized abdominal pain 06/19/2018   Nausea and vomiting 06/19/2018   Loss of weight 06/19/2018   Headache 12/18/2012   Hypothermia 12/18/2012   Pura Spice, PT, DPT # [id number] Fara Olden SPT 06/15/2021, 9:11 AM  [place] [facility] [address] [place], [place], [address] Phone: [phone]   Fax:  [phone]  Name: Muslima Toppins Pease MRN: [id number] Date of Birth: 1996-10-06

## 2021-06-16 ENCOUNTER — Ambulatory Visit: Payer: 59 | Admitting: Physical Therapy

## 2021-06-16 ENCOUNTER — Encounter: Payer: Self-pay | Admitting: Physical Therapy

## 2021-06-16 DIAGNOSIS — G8929 Other chronic pain: Secondary | ICD-10-CM

## 2021-06-16 DIAGNOSIS — M25611 Stiffness of right shoulder, not elsewhere classified: Secondary | ICD-10-CM

## 2021-06-16 DIAGNOSIS — M25612 Stiffness of left shoulder, not elsewhere classified: Secondary | ICD-10-CM

## 2021-06-16 DIAGNOSIS — M25512 Pain in left shoulder: Secondary | ICD-10-CM

## 2021-06-16 NOTE — Therapy (Signed)
[facility] [address]. [place], [place], [address] Phone: [phone]   Fax:  [phone]  Physical Therapy Treatment  Patient Details  Name: Sara Gomez MRN: [id number] Date of Birth: 1995-11-26 Referring Provider (PT): Dr. Jeannie Fend   Encounter Date: 06/16/2021   PT End of Session - 06/16/21 1152     Visit Number 19    Number of Visits 30    Date for PT Re-Evaluation 07/13/21    Authorization Type BCBS COMM Pro (primary); U[employer] (secondary); visit limits based on necessity    Authorization Time Period 04/27/21- 06/22/21    Authorization - Visit Number 9    Authorization - Number of Visits 10    PT Start Time 0804    PT Stop Time 0859    PT Time Calculation (min) 55 min    Activity Tolerance Patient tolerated treatment well    Behavior During Therapy [facility] for tasks assessed/performed             Past Medical History:  Diagnosis Date   Abdominal pain 05/23/2018   Nausea and vomiting 05/23/2018   Per [place] Hepato w Marjean Donna order    Past Surgical History:  Procedure Laterality Date   COLONOSCOPY  06/07/2018   Dr Alice Reichert   SHOULDER ARTHROSCOPY     x2   SHOULDER LATERJET     x3   TONSILLECTOMY  2005   UPPER GI ENDOSCOPY  06/07/2018   Dr Alice Reichert   WISDOM TOOTH EXTRACTION      There were no vitals filed for this visit.   Subjective Assessment - 06/16/21 1014     Subjective Pt presents to tx with 4/10 R parascapular soreness. Pt continues to swimming recreationally. Pt states the increase of soreness was due to overuse with lifting. Pt continues to report no adherence to HEP.    Pertinent History Pt presented to session reporting having bad migraine last night. Pt reports she has been driving since doctor's clearance last Friday. R shoulder feels stiff but no pain resting 3/10 pain with active ROM.    Limitations Lifting;Standing;Writing;House hold activities    Patient Stated Goals Increase R shoulder ROM/ strength  to improve pain-free mobility.    Currently in Pain? Yes    Pain Score 4     Pain Location Shoulder    Pain Orientation Right    Pain Descriptors / Indicators Aching;Sore    Pain Type Chronic pain;Surgical pain    Pain Onset 1 to 4 weeks ago               Treatment:    Ther-ex:   Supine exercises with verbal and contact cueing to ensure safety and quality of movement that is agreeable with surgical protocol.    1)Supine wand R shoulder AAROM press-ups/ shoulder flexion/shoulder ER/shoulder abduction within limitations of protocol;   2) Therapist facilitated rhythmic stabilization 1 min bouts x3.  Pt was was positioned in supine. Pt requires prolonged rest 2/2 quickly fatiguing muscle stabilizers.   3) RTB R shoulder ER 2x10; PT holding the band. Pt displayed mark weakness during activity, however, no increase in pain.   4) 3# serratus anterior push in supine with contact cueing to ensure safety of movement. X10. Pt displayed mark weakness during activity, however, no increase in pain.   5) 3# shoulder ABCs, supine with 90 deg of shoulder flexion.  6) Sitting: GTB R shoulder row 2x10. PT holds band.    Manual:  Gentle R shoulder PROM in all directions within restrictions of protocol, pain, and never pushing through resistance;  STM in Prone to R infraspinatus, teres minor, and rhomboid with AROM to flexion x16 min with manual, 5 min trial Theragun, Pt reported no symptom relief with Theragun.     PT Long Term Goals - 06/15/21 0843       PT LONG TERM GOAL #1   Title Pt. I with HEP to increase R shoulder AROM to [facility] as compared to L shoulder to improve pain-free mobility.    Baseline R sh. limited to 90 deg. flexion/ 45 deg. abduction/ 1 deg. ER per MD protocol. 7/13: Abd 90 deg flex 110 deg. ER 1 8/3: R shoulder flexion:142 deg / ER:25 deg/ IR:60/ abduction: 109 deg    Time 4    Period Weeks    Status Partially Met    Target Date 07/13/21      PT LONG TERM GOAL #2    Title Pt. will increase R deltoid/ biceps/ triceps strength to grossly 4/5 MMT to improve return to household tasks/ running.    Baseline No R shoulder MMT testing per MD protocol 7/13: R shoulder Flex 4-/5 abd 4-/5 ER 4-/5 IR 4-/5. 8/3: L/R shoulder flexion: 4/4-.  Abduction: 4/3+.  IR/ER: 4/3+.    Time 4    Period Weeks    Status Partially Met    Target Date 07/13/21      PT LONG TERM GOAL #3   Title Pt. will increase FOTO to 60 to improve R shoulder mobility.    Baseline FOTO: baseline 29 7/13: 56    Time 4    Period Weeks    Status Partially Met    Target Date 07/13/21      PT LONG TERM GOAL #4   Title Pt. able to complete all work related tasks with no R shoulder limitations or pain to promote return to PLOF.    Baseline Pt. has not returned to work at this time due to R shoulder limitations. 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 Verbal and tactile cue required to prevent shoulder elevation with ER and extension. STM to trigger point was able to release soreness from 4/10 to 1/10.  Future session will focus on shoulder strengthening in all planes and abduction AROM.    Personal Factors and Comorbidities Fitness    Stability/Clinical Decision Making Stable/Uncomplicated    Clinical Decision Making Low    Rehab Potential Good    PT Frequency 3x / week    PT Duration 4 weeks    PT Treatment/Interventions ADLs/Self Care Home Management;Aquatic Therapy;Cryotherapy;Electrical Stimulation;Therapeutic exercise;Patient/family education;Neuromuscular re-education;Manual techniques;Scar mobilization;Passive range of motion;Functional mobility training    PT Next Visit Plan  Follow MD protocol.  Reassess FOTO    PT Home Exercise Plan See handouts    Consulted and Agree with Plan of Care Patient             Patient will benefit from skilled therapeutic intervention in order to improve the following deficits and impairments:  Pain,  Decreased mobility, Decreased scar mobility, Hypomobility, Decreased strength, Decreased range of motion, Decreased endurance, Decreased activity tolerance, Impaired flexibility  Visit Diagnosis: Shoulder joint stiffness, right  Chronic left shoulder pain  Shoulder joint stiffness, left     Problem List Patient Active Problem List   Diagnosis Date Noted   Generalized abdominal pain 06/19/2018   Nausea and vomiting 06/19/2018   Loss of weight 06/19/2018   Headache 12/18/2012   Hypothermia 12/18/2012   Pura Spice, PT, DPT # [id number] Shirley Friar, SPT 06/16/2021, 2:21 PM  [facility] [address] [place], [place], [address] Phone: [phone]   Fax:  [phone]  Name: Sara Gomez MRN: [id number] Date of Birth: February 21, 1996

## 2021-06-17 ENCOUNTER — Ambulatory Visit: Payer: 59

## 2021-06-20 ENCOUNTER — Ambulatory Visit: Payer: 59 | Admitting: Physical Therapy

## 2021-06-20 ENCOUNTER — Other Ambulatory Visit: Payer: Self-pay

## 2021-06-20 ENCOUNTER — Encounter: Payer: Self-pay | Admitting: Physical Therapy

## 2021-06-20 DIAGNOSIS — M25511 Pain in right shoulder: Secondary | ICD-10-CM

## 2021-06-20 DIAGNOSIS — G8929 Other chronic pain: Secondary | ICD-10-CM

## 2021-06-20 DIAGNOSIS — M25512 Pain in left shoulder: Secondary | ICD-10-CM

## 2021-06-20 DIAGNOSIS — M6281 Muscle weakness (generalized): Secondary | ICD-10-CM

## 2021-06-20 DIAGNOSIS — M25612 Stiffness of left shoulder, not elsewhere classified: Secondary | ICD-10-CM

## 2021-06-20 DIAGNOSIS — M25611 Stiffness of right shoulder, not elsewhere classified: Secondary | ICD-10-CM | POA: Diagnosis not present

## 2021-06-20 NOTE — Therapy (Signed)
[facility] [address] [place], [place], [address] Phone: [phone]   Fax:  [phone]  Physical Therapy Treatment Physical Therapy Progress Note   Dates of reporting period  05/27/2021  to   06/20/2021  Patient Details  Name: Sara Gomez MRN: [id number] Date of Birth: [birth year] Referring Provider (PT): Dr. Jeannie Fend   Encounter Date: 06/20/2021   PT End of Session - 06/20/21 0820     Visit Number 20    Number of Visits 30    Date for PT Re-Evaluation 07/13/21    Authorization Type BCBS COMM Pro (primary); UHC (secondary); visit limits based on necessity    Authorization Time Period 04/27/21- 06/22/21    Authorization - Visit Number 10    Authorization - Number of Visits 10    PT Start Time 0728    PT Stop Time 0816    PT Time Calculation (min) 48 min    Activity Tolerance Patient tolerated treatment well    Behavior During Therapy [facility] for tasks assessed/performed             Past Medical History:  Diagnosis Date   Abdominal pain 05/23/2018   Nausea and vomiting 05/23/2018   Per [place] Hepato w Marjean Donna order    Past Surgical History:  Procedure Laterality Date   COLONOSCOPY  06/07/2018   Dr Alice Reichert   SHOULDER ARTHROSCOPY     x2   SHOULDER LATERJET     x3   TONSILLECTOMY  2005   UPPER GI ENDOSCOPY  06/07/2018   Dr Alice Reichert   WISDOM TOOTH EXTRACTION      There were no vitals filed for this visit.   Subjective Assessment - 06/20/21 0734     Subjective Pt present to tx 3/10 R shoulder soreness. Pt stated light breast-stroke swimming ~2 hrs this weekend with mild increase in soreness. 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    Pertinent History Pt presented to session reporting having bad migraine last night. Pt reports she has been driving since doctor's clearance last Friday. R shoulder feels stiff but no pain resting 3/10 pain with active ROM.    Limitations Lifting;Standing;Writing;House  hold activities    Patient Stated Goals Increase R shoulder ROM/ strength to improve pain-free mobility.    Currently in Pain? Yes    Pain Score 3     Pain Location Shoulder    Pain Orientation Right    Pain Descriptors / Indicators Aching;Sore    Pain Type Chronic pain;Surgical pain    Pain Onset 1 to 4 weeks ago              Treatment:    Ther-ex:   Supine exercises with verbal and contact cueing to ensure safety and quality of movement that is agreeable with surgical protocol.    1)Supine wand R shoulder AAROM press-ups/ shoulder flexion/shoulder ER/shoulder abduction within limitations of protocol;   2) Therapist facilitated rhythmic stabilization 1 min bouts x2.  Pt was was positioned in supine. Pt requires prolonged rest 2/2 quickly fatiguing muscle stabilizers.   3) RTB R shoulder ER 2x10; PT holding the band. Pt displayed mark weakness during activity, however, no increase in pain.    4) 2# shoulder ABCs, supine with 90 deg of shoulder flexion.   5) Sitting: GTB R shoulder row 2x10. PT holds band.   6)2# bicep curls x30 supine with scapula support on the table.    Manual:   Gentle  R shoulder PROM in all directions within restrictions of protocol, pain, and never pushing through resistance;   STM in Prone to R infraspinatus, teres minor, and rhomboid with AROM to flexion x16 min with manual. Pt reported mild decrease in stiffness and increase in ROM.         PT Long Term Goals - 06/15/21 0843       PT LONG TERM GOAL #1   Title Pt. I with HEP to increase R shoulder AROM to [facility] as compared to L shoulder to improve pain-free mobility.    Baseline R sh. limited to 90 deg. flexion/ 45 deg. abduction/ 1 deg. ER per MD protocol. 7/13: Abd 90 deg flex 110 deg. ER 1 8/3: R shoulder flexion:142 deg / ER:25 deg/ IR:60/ abduction: 109 deg    Time 4    Period Weeks    Status Partially Met    Target Date 07/13/21      PT LONG TERM GOAL #2   Title Pt. will increase R  deltoid/ biceps/ triceps strength to grossly 4/5 MMT to improve return to household tasks/ running.    Baseline No R shoulder MMT testing per MD protocol 7/13: R shoulder Flex 4-/5 abd 4-/5 ER 4-/5 IR 4-/5. 8/3: L/R shoulder flexion: 4/4-.  Abduction: 4/3+.  IR/ER: 4/3+.    Time 4    Period Weeks    Status Partially Met    Target Date 07/13/21      PT LONG TERM GOAL #3   Title Pt. will increase FOTO to 60 to improve R shoulder mobility.    Baseline FOTO: baseline 29 7/13: 56    Time 4    Period Weeks    Status Partially Met    Target Date 07/13/21      PT LONG TERM GOAL #4   Title Pt. able to complete all work related tasks with no R shoulder limitations or pain to promote return to PLOF.    Baseline Pt. has not returned to work at this time due to R shoulder limitations. 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 8/3: Able to return to bar tending work, however shoulder fatigues very quickly resulting in increase pain/soreness    Time 4    Period Weeks    Status Partially Met    Target Date 07/13/21                   Plan - 06/20/21 6226     Clinical Impression Statement Pt displayed mild decrease in tx tolerance 2/2 shoulder soreness post swimming. Pt continues to display quickly fatigue R shoulder stabilizers during therex which results increase pain in the parascapular muscles. Pt continues to display poor adherence to HEP resulting in decrease efficiency of tx. Pt. will continue to benefit from skilled physical therapy to progress POC to address remaining deficits to facilitate maximum functional capacity for optimal personal health and wellness for ADLs.  FOTO: 60 (marked increase but remains limited with overhead reaching wt./ carrying).    Personal Factors and Comorbidities Fitness    Stability/Clinical Decision Making Stable/Uncomplicated    Clinical Decision Making Low    Rehab Potential Good    PT Frequency 3x / week    PT Duration 4 weeks    PT Treatment/Interventions  ADLs/Self Care Home Management;Aquatic Therapy;Cryotherapy;Electrical Stimulation;Therapeutic exercise;Patient/family education;Neuromuscular re-education;Manual techniques;Scar mobilization;Passive range of motion;Functional mobility training    PT Next Visit Plan Follow MD protocol.    PT Home Exercise Plan See handouts  Consulted and Agree with Plan of Care Patient             Patient will benefit from skilled therapeutic intervention in order to improve the following deficits and impairments:  Pain, Decreased mobility, Decreased scar mobility, Hypomobility, Decreased strength, Decreased range of motion, Decreased endurance, Decreased activity tolerance, Impaired flexibility  Visit Diagnosis: Shoulder joint stiffness, right  Acute pain of right shoulder  Chronic left shoulder pain  Shoulder joint stiffness, left  Muscle weakness (generalized)     Problem List Patient Active Problem List   Diagnosis Date Noted   Generalized abdominal pain 06/19/2018   Nausea and vomiting 06/19/2018   Loss of weight 06/19/2018   Headache 12/18/2012   Hypothermia 12/18/2012   Pura Spice, PT, DPT # [id number] Fara Olden SPT 06/20/2021, 9:07 AM  [facility] [address] [place], [place], [address] Phone: [phone]   Fax:  [phone]  Name: Keelia Graybill Guevara MRN: [id number] Date of Birth: September 12, 1996

## 2021-06-22 ENCOUNTER — Ambulatory Visit: Payer: 59 | Admitting: Physical Therapy

## 2021-06-22 ENCOUNTER — Other Ambulatory Visit: Payer: Self-pay

## 2021-06-22 ENCOUNTER — Encounter: Payer: Self-pay | Admitting: Physical Therapy

## 2021-06-22 DIAGNOSIS — M6281 Muscle weakness (generalized): Secondary | ICD-10-CM

## 2021-06-22 DIAGNOSIS — M25511 Pain in right shoulder: Secondary | ICD-10-CM

## 2021-06-22 DIAGNOSIS — M25611 Stiffness of right shoulder, not elsewhere classified: Secondary | ICD-10-CM

## 2021-06-22 NOTE — Therapy (Addendum)
[facility] [address] [place], [place], [address] Phone: [phone]   Fax:  [phone]  Physical Therapy Treatment  Patient Details  Name: Sara Gomez MRN: [id number] Date of Birth: [birth year] Referring Provider (PT): Dr. Jeannie Fend   Encounter Date: 06/22/2021   PT End of Session - 06/22/21 0737     Visit Number 21    Number of Visits 30    Date for PT Re-Evaluation 07/13/21    Authorization Type BCBS COMM Pro (primary); [employer] (secondary); visit limits based on necessity    Authorization Time Period 04/27/21- 06/22/21    Authorization - Visit Number 1    Authorization - Number of Visits 10    PT Start Time 0732    PT Stop Time 0816    PT Time Calculation (min) 44 min    Activity Tolerance Patient tolerated treatment well    Behavior During Therapy [facility] for tasks assessed/performed             Past Medical History:  Diagnosis Date   Abdominal pain 05/23/2018   Nausea and vomiting 05/23/2018   Per NM Hepato w Marjean Donna order    Past Surgical History:  Procedure Laterality Date   COLONOSCOPY  06/07/2018   Dr Alice Reichert   SHOULDER ARTHROSCOPY     x2   SHOULDER LATERJET     x3   TONSILLECTOMY  2005   UPPER GI ENDOSCOPY  06/07/2018   Dr Alice Reichert   WISDOM TOOTH EXTRACTION      There were no vitals filed for this visit.   Subjective Assessment - 06/22/21 0732     Subjective Pt present to tx with 2/10 R shoulder soreness. Pt states headache that started two days ago and has continued without reduction in sx.    Pertinent History Pt presented to session reporting having bad migraine last night. Pt reports she has been driving since doctor's clearance last Friday. R shoulder feels stiff but no pain resting 3/10 pain with active ROM.    Limitations Lifting;Standing;Writing;House hold activities    Patient Stated Goals Increase R shoulder ROM/ strength to improve pain-free mobility.    Currently in Pain? Yes    Pain  Score 2     Pain Location Shoulder    Pain Orientation Right    Pain Descriptors / Indicators Aching;Sore    Pain Type Surgical pain    Pain Onset 1 to 4 weeks ago                Treatment:    Ther-ex:   Supine exercises with verbal and contact cueing to ensure safety and quality of movement that is agreeable with surgical protocol.    1)Long seated wand R shoulder AAROM press-ups/ shoulder flexion/shoulder ER/shoulder abduction within limitations of protocol;    2) Therapist facilitated rhythmic stabilization 1 min bouts x2.  Pt was was positioned in supine. Pt requires prolonged rest 2/2 quickly fatiguing muscle stabilizers.   3) GTB R shoulder ER 2x10; PT holding the band. Pt displayed mark weakness during activity, however, no increase in pain.    4) 3# shoulder ABCs, supine with 90 deg of shoulder flexion.   5) Sitting: BTB R shoulder row 2x10. PT holds band.    6)3# bicep curls x30 supine with scapula support on the table.   7) 3# chest press x20 verbal and contact cueing to ensure safety of movement.    8)Skull crusher. X4 with 3# weight, x10 with  1# x10 without weight. Pt required extensive verbal and contact cueing to ensure quality of movement with little carry over.   9) D1/D2 flexion/ext R shoulder x30  each direction. Verbal cueing need for quality of movement.    Manual:   Gentle R shoulder PROM in all directions within restrictions of protocol, pain, and never pushing through resistance;   STM in Prone to R infraspinatus, teres minor, and rhomboid with AROM to flexion x12 min with manual. Pt reported mild decrease in stiffness and increase in ROM.       PT Long Term Goals - 06/15/21 0843       PT LONG TERM GOAL #1   Title Pt. I with HEP to increase R shoulder AROM to [facility] as compared to L shoulder to improve pain-free mobility.    Baseline R sh. limited to 90 deg. flexion/ 45 deg. abduction/ 1 deg. ER per MD protocol. 7/13: Abd 90 deg flex 110 deg.  ER 1 8/3: R shoulder flexion:142 deg / ER:25 deg/ IR:60/ abduction: 109 deg    Time 4    Period Weeks    Status Partially Met    Target Date 07/13/21      PT LONG TERM GOAL #2   Title Pt. will increase R deltoid/ biceps/ triceps strength to grossly 4/5 MMT to improve return to household tasks/ running.    Baseline No R shoulder MMT testing per MD protocol 7/13: R shoulder Flex 4-/5 abd 4-/5 ER 4-/5 IR 4-/5. 8/3: L/R shoulder flexion: 4/4-.  Abduction: 4/3+.  IR/ER: 4/3+.    Time 4    Period Weeks    Status Partially Met    Target Date 07/13/21      PT LONG TERM GOAL #3   Title Pt. will increase FOTO to 60 to improve R shoulder mobility.    Baseline FOTO: baseline 29 7/13: 56    Time 4    Period Weeks    Status Partially Met    Target Date 07/13/21      PT LONG TERM GOAL #4   Title Pt. able to complete all work related tasks with no R shoulder limitations or pain to promote return to PLOF.    Baseline Pt. has not returned to work at this time due to R shoulder limitations. 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 Pt displayed decreased stiffness during end range stretching and increase in ER to 40 deg from 10 at the start of tx. Pt continues to complain of pain during end range ER. PT continues to display poor R shoulder strength/coordination through full range, displayed most clearly during D1/D2 patterns. Pt. will continue to benefit from skilled physical therapy to progress POC to address remaining deficits to facilitate maximum functional capacity for optimal personal health and wellness for ADLs.    Personal Factors and Comorbidities Fitness     Stability/Clinical Decision Making Stable/Uncomplicated    Clinical Decision Making Moderate    Rehab Potential Good    PT Frequency 3x / week  PT Duration 4 weeks    PT Treatment/Interventions ADLs/Self Care Home Management;Aquatic Therapy;Cryotherapy;Electrical Stimulation;Therapeutic exercise;Patient/family education;Neuromuscular re-education;Manual techniques;Scar mobilization;Passive range of motion;Functional mobility training    PT Next Visit Plan Follow MD protocol.    PT Home Exercise Plan See handouts    Consulted and Agree with Plan of Care Patient             Patient will benefit from skilled therapeutic intervention in order to improve the following deficits and impairments:  Pain, Decreased mobility, Decreased scar mobility, Hypomobility, Decreased strength, Decreased range of motion, Decreased endurance, Decreased activity tolerance, Impaired flexibility  Visit Diagnosis: Shoulder joint stiffness, right  Muscle weakness (generalized)  Acute pain of right shoulder     Problem List Patient Active Problem List   Diagnosis Date Noted   Generalized abdominal pain 06/19/2018   Nausea and vomiting 06/19/2018   Loss of weight 06/19/2018   Headache 12/18/2012   Hypothermia 12/18/2012   Pura Spice, PT, DPT # [id number] Fara Olden SPT 06/22/2021, 2:20 PM  [facility] [address] [place], [place], [address] Phone: [phone]   Fax:  [phone]  Name: Sara Gomez MRN: [id number] Date of Birth: Jul 12, 1996

## 2021-06-23 ENCOUNTER — Ambulatory Visit: Payer: 59

## 2021-06-23 DIAGNOSIS — M25611 Stiffness of right shoulder, not elsewhere classified: Secondary | ICD-10-CM | POA: Diagnosis not present

## 2021-06-23 DIAGNOSIS — M6281 Muscle weakness (generalized): Secondary | ICD-10-CM

## 2021-06-23 NOTE — Therapy (Signed)
[facility] [address]. [place], [place], [address] Phone: [phone]   Fax:  [phone]  Physical Therapy Treatment  Patient Details  Name: Sara Gomez MRN: [id number] Date of Birth: [birth year] Referring Provider (PT): Dr. Jeannie Fend   Encounter Date: 06/23/2021   PT End of Session - 06/23/21 1030     Visit Number 22    Number of Visits 30    Date for PT Re-Evaluation 07/13/21    Authorization Type BCBS COMM Pro (primary); U[employer] (secondary); visit limits based on necessity    Authorization Time Period 04/27/21- 06/22/21    Authorization - Visit Number 2    Authorization - Number of Visits 10    PT Start Time 1028    PT Stop Time 1115    PT Time Calculation (min) 47 min    Activity Tolerance Patient tolerated treatment well    Behavior During Therapy [facility] for tasks assessed/performed             Past Medical History:  Diagnosis Date   Abdominal pain 05/23/2018   Nausea and vomiting 05/23/2018   Per [place] Hepato w Marjean Donna order    Past Surgical History:  Procedure Laterality Date   COLONOSCOPY  06/07/2018   Dr Alice Reichert   SHOULDER ARTHROSCOPY     x2   SHOULDER LATERJET     x3   TONSILLECTOMY  2005   UPPER GI ENDOSCOPY  06/07/2018   Dr Alice Reichert   WISDOM TOOTH EXTRACTION      There were no vitals filed for this visit.   Subjective Assessment - 06/23/21 1030     Subjective Pt states that she is doing well today. Mild headache upon arrival. Worked last night and was concerned that she would be sore today but it has not been bothersome. She reports 2/10 R sholder pain upon arrival. No specific questions or concerns currently.    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 She has had 3 surgeries on the R shoulder and 4 on the L    Limitations Lifting;Standing;Writing;House hold activities    Patient Stated  Goals Increase R shoulder ROM/ strength to improve pain-free mobility.               TREATMENT     Trigger Point Dry Needling (TDN), unbilled Education performed with patient regarding potential benefit of TDN. Reviewed precautions and risks with patient. Reviewed special precautions/risks over lung fields which include pneumothorax. Reviewed signs and symptoms of pneumothorax and advised pt to go to ER immediately if these symptoms develop advise them of dry needling treatment. Extensive time spent with pt to ensure full understanding of TDN risks. Pt provided verbal consent to treatment. Using clean technique TDN performed to with R upper trap with 2, 0.25 x 40 single needle placements (one in prone and one in supine) with local twitch response (LTR). Also performed 2 placements to R infraspinatus with 2, 0.25 x 40 single needle placements. Scapula contacted during both placements. Pistoning technique utilized. Pt reports R upper trap soreness afterward.    Ther-ex  Supine exercises with verbal and contact cueing to ensure safety and quality of movement that is agreeable with surgical protocol.    Supine serratus punch with manual resistance from therapist 2 x 10; Supine rhythmic stabilization at 90 flexion 30s x 2; Supine R shoulder flexion with 1# dumbbell (DB) x 10; Prone  R shoulder extension, low row, horizontal abduction, ER (at 90 in available ROM), and flexion with 1# DB 2 x 10 each (attempted 2# but too difficult for patient with mild irritation of anterior R shoulder pain);   Pt educated throughout session about proper posture and technique with exercises. Improved exercise technique, movement at target joints, use of target muscles after min to mod verbal, visual, tactile cues.    Pt demonstrates excellent motivation today. She reports R lateral neck pain along the region of her right upper trap last night and this morning so introduced trigger point dry needling with the  agreement of her primary therapist and patient.  Also included dry needling to right infraspinatus trigger points which have been aggravating to patient for multiple weeks now.  Continued with progressive strengthening of right shoulder and periscapular muscles today.  Patient encouraged to continue HEP and follow-up as scheduled.  Patient will continue to benefit from skilled PT services to address deficits in right shoulder pain, weakness, and range motion in order to improve function at home, school, and work.      PT Long Term Goals - 06/15/21 0843       PT LONG TERM GOAL #1   Title Pt. I with HEP to increase R shoulder AROM to [facility] as compared to L shoulder to improve pain-free mobility.    Baseline R sh. limited to 90 deg. flexion/ 45 deg. abduction/ 1 deg. ER per MD protocol. 7/13: Abd 90 deg flex 110 deg. ER 1 8/3: R shoulder flexion:142 deg / ER:25 deg/ IR:60/ abduction: 109 deg    Time 4    Period Weeks    Status Partially Met    Target Date 07/13/21      PT LONG TERM GOAL #2   Title Pt. will increase R deltoid/ biceps/ triceps strength to grossly 4/5 MMT to improve return to household tasks/ running.    Baseline No R shoulder MMT testing per MD protocol 7/13: R shoulder Flex 4-/5 abd 4-/5 ER 4-/5 IR 4-/5. 8/3: L/R shoulder flexion: 4/4-.  Abduction: 4/3+.  IR/ER: 4/3+.    Time 4    Period Weeks    Status Partially Met    Target Date 07/13/21      PT LONG TERM GOAL #3   Title Pt. will increase FOTO to 60 to improve R shoulder mobility.    Baseline FOTO: baseline 29 7/13: 56    Time 4    Period Weeks    Status Partially Met    Target Date 07/13/21      PT LONG TERM GOAL #4   Title Pt. able to complete all work related tasks with no R shoulder limitations or pain to promote return to PLOF.    Baseline Pt. has not returned to work at this time due to R shoulder limitations. 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 She reports R lateral neck pain along the region of her right upper trap last night and this morning so introduced trigger point dry needling with the agreement of her primary therapist  and patient.  Also included dry needling to right infraspinatus trigger points which have been aggravating to patient for multiple weeks now.  Continued with progressive strengthening of right shoulder and periscapular muscles today.  Patient encouraged to continue HEP and follow-up as scheduled.  Patient will continue to benefit from skilled PT services to address deficits in right shoulder pain, weakness, and range motion in order to improve function at home, school, and work.    Personal Factors and Comorbidities Fitness    Stability/Clinical Decision Making Stable/Uncomplicated    Rehab Potential Good    PT Frequency 3x / week    PT Duration 4 weeks    PT Treatment/Interventions ADLs/Self Care Home Management;Aquatic Therapy;Cryotherapy;Electrical Stimulation;Therapeutic exercise;Patient/family education;Neuromuscular re-education;Manual techniques;Scar mobilization;Passive range of motion;Functional mobility training    PT Next Visit Plan Follow MD protocol.    PT Home Exercise Plan See handouts    Consulted and Agree with Plan of Care Patient             Patient will benefit from skilled therapeutic intervention in order to improve the following deficits and impairments:  Pain, Decreased mobility, Decreased scar mobility, Hypomobility, Decreased strength, Decreased range of motion, Decreased endurance, Decreased activity tolerance, Impaired flexibility  Visit Diagnosis: Shoulder joint stiffness, right  Muscle weakness (generalized)     Problem  List Patient Active Problem List   Diagnosis Date Noted   Generalized abdominal pain 06/19/2018   Nausea and vomiting 06/19/2018   Loss of weight 06/19/2018   Headache 12/18/2012   Hypothermia 12/18/2012   Sara Gomez PT, DPT, GCS  06/23/2021, 11:32 AM  [place] [facility] [address] [place], [place], [address] Phone: [phone]   Fax:  [phone]  Name: Sara Gomez MRN: [id number] Date of Birth: 04-Aug-1996

## 2021-06-27 ENCOUNTER — Ambulatory Visit: Payer: 59 | Admitting: Physical Therapy

## 2021-06-29 ENCOUNTER — Ambulatory Visit: Payer: 59 | Admitting: Physical Therapy

## 2021-06-29 ENCOUNTER — Encounter: Payer: Self-pay | Admitting: Physical Therapy

## 2021-06-29 ENCOUNTER — Other Ambulatory Visit: Payer: Self-pay

## 2021-06-29 DIAGNOSIS — M25611 Stiffness of right shoulder, not elsewhere classified: Secondary | ICD-10-CM

## 2021-06-29 DIAGNOSIS — M6281 Muscle weakness (generalized): Secondary | ICD-10-CM

## 2021-06-29 DIAGNOSIS — M25511 Pain in right shoulder: Secondary | ICD-10-CM

## 2021-06-29 NOTE — Therapy (Signed)
[facility] [address] [place], [place], [address] Phone: [phone]   Fax:  [phone]  Physical Therapy Treatment  Patient Details  Name: Sara Gomez MRN: [id number] Date of Birth: 1996-04-24 Referring Provider (PT): Dr. Jeannie Fend   Encounter Date: 06/29/2021   PT End of Session - 06/29/21 1436     Visit Number 23    Number of Visits 30    Date for PT Re-Evaluation 07/13/21    Authorization Type BCBS COMM Pro (primary); [employer] (secondary); visit limits based on necessity    Authorization Time Period 04/27/21- 06/22/21    Authorization - Visit Number 3    Authorization - Number of Visits 10    PT Start Time 0732    PT Stop Time 0817    PT Time Calculation (min) 45 min    Activity Tolerance Patient tolerated treatment well;Patient limited by pain    Behavior During Therapy [facility] for tasks assessed/performed             Past Medical History:  Diagnosis Date   Abdominal pain 05/23/2018   Nausea and vomiting 05/23/2018   Per NM Hepato w Marjean Donna order    Past Surgical History:  Procedure Laterality Date   COLONOSCOPY  06/07/2018   Dr Alice Reichert   SHOULDER ARTHROSCOPY     x2   SHOULDER LATERJET     x3   TONSILLECTOMY  2005   UPPER GI ENDOSCOPY  06/07/2018   Dr Alice Reichert   WISDOM TOOTH EXTRACTION      There were no vitals filed for this visit.   Subjective Assessment - 06/29/21 1430     Subjective Pt. reports having a difficult past few days due to paralysis of dog over weekend.  Pt. states her dog had spinal surgery over weekend and hasn't been able to see dog until maybe today or tomorrow.  Pt. states she is stressed due to cost of surgery and health of dog.  Pt. states she has been hurting more in R shoulder over past several days.  Pt. c/o 5/10 R shoulder pain prior to tx. session.    Pertinent History Status post R shoulder revision bone block procedure (replacement of old coracoid transfer with distal tibial  allograft) on 04/07/2021. She has had 3 surgeries on the R shoulder and 4 on the L    Limitations Lifting;Standing;Writing;House hold activities    Patient Stated Goals Increase R shoulder ROM/ strength to improve pain-free mobility.    Currently in Pain? Yes    Pain Score 5     Pain Location Shoulder    Pain Orientation Right    Pain Descriptors / Indicators Aching;Constant             MH to upper back/neck while in prone position during R sh. ROM/ manual tx.   Manual tx.:     Prone position: R shoulder grade II-III PA mobs. 5x30 sec. Each (as tolerated).   R shoulder AA/PROM (all planes)- 10x each as tolerated.  Attempted 2nd set but pain limited. STM to R scap./R UT/ cervical musculature 9 min.   Supine cervical traction/ UT/ levator stretches 3x each.  R shoulder AA/PROM 10x2 each.    There.ex.:  Supine B shoulder wand ex.: chest press/ sh. Flexion/ abduction/ ER 10x2.  Pain limited.  Seated R shoulder AROM flexion/ scaption 10x.    Pt. Encouraged to remain active at home/ complete HEP/ pulleys.       PT Long  Term Goals - 06/15/21 0843       PT LONG TERM GOAL #1   Title Pt. I with HEP to increase R shoulder AROM to [facility] as compared to L shoulder to improve pain-free mobility.    Baseline R sh. limited to 90 deg. flexion/ 45 deg. abduction/ 1 deg. ER per MD protocol. 7/13: Abd 90 deg flex 110 deg. ER 1 8/3: R shoulder flexion:142 deg / ER:25 deg/ IR:60/ abduction: 109 deg    Time 4    Period Weeks    Status Partially Met    Target Date 07/13/21      PT LONG TERM GOAL #2   Title Pt. will increase R deltoid/ biceps/ triceps strength to grossly 4/5 MMT to improve return to household tasks/ running.    Baseline No R shoulder MMT testing per MD protocol 7/13: R shoulder Flex 4-/5 abd 4-/5 ER 4-/5 IR 4-/5. 8/3: L/R shoulder flexion: 4/4-.  Abduction: 4/3+.  IR/ER: 4/3+.    Time 4    Period Weeks    Status Partially Met    Target Date 07/13/21      PT LONG TERM GOAL #3    Title Pt. will increase FOTO to 60 to improve R shoulder mobility.    Baseline FOTO: baseline 29 7/13: 56    Time 4    Period Weeks    Status Partially Met    Target Date 07/13/21      PT LONG TERM GOAL #4   Title Pt. able to complete all work related tasks with no R shoulder limitations or pain to promote return to PLOF.    Baseline Pt. has not returned to work at this time due to R shoulder limitations. 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 tolerance today with pain limited gentle R shoulder AROM/ manual stretches.  Pt. requires moderate cuing/ instruction to encourage participation with R shoulder A/AROM ex.  Pt. limited to 2 sets of 10 reps with most exercises due to pain/ fatigue.  PT tx. focus on cervical/ shoulder stretches and STM to decrease muscle tightness in R sh./scapular musculature.  Moderate R UT muscle tightness and trigger points noted during STM.  No trigger point dry needling today secondary to marked increase in muscle soreness/ pain after last tx.  Pt. will continue to benefit from skilled PT services to address deficits in R sh., weakness and range of motion in order to improve function at home, school and work.    Personal Factors and Comorbidities Fitness    Stability/Clinical Decision Making Stable/Uncomplicated    Clinical Decision Making Low    Rehab Potential Good    PT Frequency 3x / week    PT Duration 4 weeks    PT Treatment/Interventions ADLs/Self Care Home Management;Aquatic Therapy;Cryotherapy;Electrical Stimulation;Therapeutic exercise;Patient/family education;Neuromuscular re-education;Manual techniques;Scar mobilization;Passive range of motion;Functional mobility training    PT  Next Visit Plan Follow MD protocol.  MD f/u in 2 weeks    PT Home Exercise Plan See handouts    Consulted and Agree with Plan of Care Patient  Patient will benefit from skilled therapeutic intervention in order to improve the following deficits and impairments:  Pain, Decreased mobility, Decreased scar mobility, Hypomobility, Decreased strength, Decreased range of motion, Decreased endurance, Decreased activity tolerance, Impaired flexibility  Visit Diagnosis: Shoulder joint stiffness, right  Muscle weakness (generalized)  Acute pain of right shoulder     Problem List Patient Active Problem List   Diagnosis Date Noted   Generalized abdominal pain 06/19/2018   Nausea and vomiting 06/19/2018   Loss of weight 06/19/2018   Headache 12/18/2012   Hypothermia 12/18/2012   Pura Spice, PT, DPT # [phone] 06/30/2021, 8:56 AM  [place] [address] [place], [place], [address] Phone: [phone]   Fax:  [phone]  Name: Sara Gomez MRN: [id number] Date of Birth: [birth year]

## 2021-07-01 ENCOUNTER — Other Ambulatory Visit: Payer: Self-pay

## 2021-07-01 ENCOUNTER — Ambulatory Visit: Payer: 59

## 2021-07-01 DIAGNOSIS — M25611 Stiffness of right shoulder, not elsewhere classified: Secondary | ICD-10-CM | POA: Diagnosis not present

## 2021-07-01 DIAGNOSIS — M6281 Muscle weakness (generalized): Secondary | ICD-10-CM

## 2021-07-01 DIAGNOSIS — M25511 Pain in right shoulder: Secondary | ICD-10-CM

## 2021-07-01 NOTE — Therapy (Signed)
[facility] [address] [place], [place], [address] Phone: [phone]   Fax:  [phone]  Physical Therapy Treatment  Patient Details  Name: Sara Gomez MRN: [id number] Date of Birth: [birth year] Referring Provider (PT): Dr. Jeannie Fend   Encounter Date: 07/01/2021   PT End of Session - 07/01/21 0805     Visit Number 24    Number of Visits 30    Date for PT Re-Evaluation 07/13/21    Authorization Type BCBS COMM Pro (primary); [employer] (secondary); visit limits based on necessity    Authorization Time Period 04/27/21- 06/22/21    Authorization - Visit Number 4    Authorization - Number of Visits 10    PT Start Time 0755    PT Stop Time 0840    PT Time Calculation (min) 45 min    Activity Tolerance Patient tolerated treatment well;Patient limited by pain    Behavior During Therapy [facility] for tasks assessed/performed             Past Medical History:  Diagnosis Date   Abdominal pain 05/23/2018   Nausea and vomiting 05/23/2018   Per NM Hepato w Marjean Donna order    Past Surgical History:  Procedure Laterality Date   COLONOSCOPY  06/07/2018   Dr Alice Reichert   SHOULDER ARTHROSCOPY     x2   SHOULDER LATERJET     x3   TONSILLECTOMY  2005   UPPER GI ENDOSCOPY  06/07/2018   Dr Alice Reichert   WISDOM TOOTH EXTRACTION      There were no vitals filed for this visit.   Subjective Assessment - 07/01/21 0800     Subjective Pt states she has been having nerve pain recently. She complains of 4/10 R shoulder pain prior to tx session. She has been having a lot of stress recently related to her dog's recent surgery. No specific questions currently.    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 She has had 3 surgeries on the R shoulder and 4 on the L    Limitations Lifting;Standing;Writing;House hold activities    Patient Stated Goals  Increase R shoulder ROM/ strength to improve pain-free mobility.               TREATMENT   Manual Therapy R shoulder PROM (all planes); L sidelying STM to R rhomboids, infraspinatus, mid trap, and upper trap; Supine manual cervical traction 30s on/off x 3 bouts; R upper trap and scalene stretches 45s x 2 each;   Ther-ex  Supine exercises with verbal and contact cueing to ensure safety and quality of movement that is agreeable with surgical protocol.    Supine B shoulder wand ex.: chest press/ sh. Flexion/ abduction/ ER x multiple bouts each, pain limited.  Supine serratus punch with manual resistance from therapist  x 10; Supine rhythmic stabilization at 90 flexion x 30s; Supine R shoulder flexion with 1# dumbbell (DB) x 10; Prone R shoulder extension with 1# DB x 10; Attempted prone R shoulder low rows and horizontal abduction but discontinued due to anteiror shoulder pain;   Pt is more limited today secondary to increase in R shoulder pain. Additional time spent on STM to R shoulder and periscapular muscles.  Patient encouraged to continue HEP and follow-up as scheduled. Pt will benefit from PT services to address deficits in strength and pain in order to return to full function at home, school,  and work.       PT Long Term Goals - 06/15/21 0843       PT LONG TERM GOAL #1   Title Pt. I with HEP to increase R shoulder AROM to [facility] as compared to L shoulder to improve pain-free mobility.    Baseline R sh. limited to 90 deg. flexion/ 45 deg. abduction/ 1 deg. ER per MD protocol. 7/13: Abd 90 deg flex 110 deg. ER 1 8/3: R shoulder flexion:142 deg / ER:25 deg/ IR:60/ abduction: 109 deg    Time 4    Period Weeks    Status Partially Met    Target Date 07/13/21      PT LONG TERM GOAL #2   Title Pt. will increase R deltoid/ biceps/ triceps strength to grossly 4/5 MMT to improve return to household tasks/ running.    Baseline No R shoulder MMT testing per MD protocol 7/13: R  shoulder Flex 4-/5 abd 4-/5 ER 4-/5 IR 4-/5. 8/3: L/R shoulder flexion: 4/4-.  Abduction: 4/3+.  IR/ER: 4/3+.    Time 4    Period Weeks    Status Partially Met    Target Date 07/13/21      PT LONG TERM GOAL #3   Title Pt. will increase FOTO to 60 to improve R shoulder mobility.    Baseline FOTO: baseline 29 7/13: 56    Time 4    Period Weeks    Status Partially Met    Target Date 07/13/21      PT LONG TERM GOAL #4   Title Pt. able to complete all work related tasks with no R shoulder limitations or pain to promote return to PLOF.    Baseline Pt. has not returned to work at this time due to R shoulder limitations. 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 Additional time spent on STM to R shoulder and periscapular muscles.  Patient encouraged to continue HEP and follow-up as scheduled. Pt will benefit from PT services to address deficits in strength and pain in order to return to full function at home, school, and work.    Personal Factors and Comorbidities Fitness    Stability/Clinical Decision Making Stable/Uncomplicated    Rehab Potential Good    PT Frequency 3x / week    PT Duration 4 weeks    PT Treatment/Interventions ADLs/Self Care Home Management;Aquatic Therapy;Cryotherapy;Electrical Stimulation;Therapeutic exercise;Patient/family education;Neuromuscular re-education;Manual techniques;Scar mobilization;Passive range of motion;Functional mobility training    PT Next Visit Plan Follow MD protocol. MD f/u in 2 weeks    PT Home Exercise Plan See handouts    Consulted and Agree with Plan of Care Patient             Patient will benefit from skilled  therapeutic intervention in order to improve the following deficits and impairments:  Pain, Decreased mobility, Decreased scar mobility, Hypomobility, Decreased strength, Decreased range of motion, Decreased endurance, Decreased activity tolerance, Impaired flexibility  Visit Diagnosis: Muscle weakness (generalized)  Acute pain of right shoulder     Problem List Patient  Active Problem List   Diagnosis Date Noted   Generalized abdominal pain 06/19/2018   Nausea and vomiting 06/19/2018   Loss of weight 06/19/2018   Headache 12/18/2012   Hypothermia 12/18/2012    Phillips Grout PT, DPT, GCS  07/01/2021, 1:13 PM  [place] [facility] [address] [place], [place], [address] Phone: [phone]   Fax:  [phone]  Name: Sara Gomez MRN: [id number] Date of Birth: 17-Sep-1996

## 2021-07-04 ENCOUNTER — Ambulatory Visit: Payer: 59 | Admitting: Physical Therapy

## 2021-07-04 ENCOUNTER — Encounter: Payer: Self-pay | Admitting: Physical Therapy

## 2021-07-04 ENCOUNTER — Other Ambulatory Visit: Payer: Self-pay

## 2021-07-04 DIAGNOSIS — M25612 Stiffness of left shoulder, not elsewhere classified: Secondary | ICD-10-CM

## 2021-07-04 DIAGNOSIS — M25611 Stiffness of right shoulder, not elsewhere classified: Secondary | ICD-10-CM

## 2021-07-04 DIAGNOSIS — M25511 Pain in right shoulder: Secondary | ICD-10-CM

## 2021-07-04 DIAGNOSIS — M6281 Muscle weakness (generalized): Secondary | ICD-10-CM

## 2021-07-04 NOTE — Therapy (Signed)
[facility] [address] [place], [place], [address] Phone: [phone]   Fax:  [phone]  Physical Therapy Treatment  Patient Details  Name: Sara Gomez MRN: [id number] Date of Birth: [birth year] Referring Provider (PT): Dr. Jeannie Fend   Encounter Date: 07/04/2021   PT End of Session - 07/04/21 0741     Visit Number 25    Number of Visits 30    Date for PT Re-Evaluation 07/13/21    Authorization Type [employer]BS COMM Pro (primary); U[employer] (secondary); visit limits based on necessity    Authorization Time Period 04/27/21- 06/22/21    Authorization - Visit Number 5    Authorization - Number of Visits 10    PT Start Time 0736    PT Stop Time 0822    PT Time Calculation (min) 46 min    Activity Tolerance Patient tolerated treatment well;Patient limited by pain    Behavior During Therapy [facility] for tasks assessed/performed             Past Medical History:  Diagnosis Date   Abdominal pain 05/23/2018   Nausea and vomiting 05/23/2018   Per NM Hepato w Marjean Donna order    Past Surgical History:  Procedure Laterality Date   COLONOSCOPY  06/07/2018   Dr Alice Reichert   SHOULDER ARTHROSCOPY     x2   SHOULDER LATERJET     x3   TONSILLECTOMY  2005   UPPER GI ENDOSCOPY  06/07/2018   Dr Alice Reichert   WISDOM TOOTH EXTRACTION      There were no vitals filed for this visit.   Subjective Assessment - 07/04/21 0734     Subjective Pt presents to tx with 3/10 R shoulder pain. Pt stated that she visited her friend in [place] to reduce stress with her dog health issues. Pt stated that her L hip is painful due to prolonged time in heels over the weekend.    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 She has had 3 surgeries on the R shoulder and 4 on the L    Limitations Lifting;Standing;Writing;House hold activities    Patient Stated Goals  Increase R shoulder ROM/ strength to improve pain-free mobility.    Currently in Pain? Yes    Pain Score 3     Pain Location Shoulder    Pain Orientation Right    Pain Descriptors / Indicators Aching;Constant    Pain Type Surgical pain              Treatment:    Ther-ex:   Supine exercises with verbal and contact cueing to ensure safety and quality of movement that is agreeable with surgical protocol.    1)Supine wand R shoulder AAROM press-ups/ shoulder flexion/shoulder ER/shoulder abduction within limitations of protocol;      2) GTB R shoulder ER 2x10; PT holding the band. Pt displayed mark weakness during activity, however, no increase in pain.    3) 2 # shoulder ABCs, supine with 90 deg of shoulder flexion.   4) Sitting: GTB R shoulder row 2x10. PT holds band.    5)2# bicep curls x30 supine with scapula support on the table.   6) 2# chest press x20 verbal and contact cueing to ensure safety of movement.     8)Serratus punches in supine without weight and contact cueing to facilitate stability without contact.     9) D1/D2 flexion/ext R shoulder  x20  each direction with therapist resistance. Verbal cueing need for quality of movement.      Manual:     STM in [place] to R infraspinatus, teres minor, and rhomboid with AROM to flexion x16 min with manual. Pt reported mild decrease in stiffness and increase in ROM.         PT Long Term Goals - 06/15/21 0843       PT LONG TERM GOAL #1   Title Pt. I with HEP to increase R shoulder AROM to [facility] as compared to L shoulder to improve pain-free mobility.    Baseline R sh. limited to 90 deg. flexion/ 45 deg. abduction/ 1 deg. ER per MD protocol. 7/13: Abd 90 deg flex 110 deg. ER 1 8/3: R shoulder flexion:142 deg / ER:25 deg/ IR:60/ abduction: 109 deg    Time 4    Period Weeks    Status Partially Met    Target Date 07/13/21      PT LONG TERM GOAL #2   Title Pt. will increase R deltoid/ biceps/ triceps strength to  grossly 4/5 MMT to improve return to household tasks/ running.    Baseline No R shoulder MMT testing per MD protocol 7/13: R shoulder Flex 4-/5 abd 4-/5 ER 4-/5 IR 4-/5. 8/3: L/R shoulder flexion: 4/4-.  Abduction: 4/3+.  IR/ER: 4/3+.    Time 4    Period Weeks    Status Partially Met    Target Date 07/13/21      PT LONG TERM GOAL #3   Title Pt. will increase FOTO to 60 to improve R shoulder mobility.    Baseline FOTO: baseline 29 7/13: 56    Time 4    Period Weeks    Status Partially Met    Target Date 07/13/21      PT LONG TERM GOAL #4   Title Pt. able to complete all work related tasks with no R shoulder limitations or pain to promote return to PLOF.    Baseline Pt. has not returned to work at this time due to R shoulder limitations. 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 Resistance was regressed to 2# DB compared to 3# 2/2 increased life stress leading to reduced activity tolerance. Pt continues to display poor R shoulder strength, especially above 90 deg of elevation. Pt. will continue to benefit from skilled physical therapy to progress POC to address remaining deficits to facilitate maximum functional capacity for optimal personal health and wellness for ADLs.    Personal Factors and Comorbidities Fitness    Stability/Clinical Decision Making Stable/Uncomplicated    Clinical Decision Making Low    Rehab Potential Good    PT Frequency 3x / week    PT Duration 4 weeks    PT Treatment/Interventions ADLs/Self Care Home Management;Aquatic Therapy;Cryotherapy;Electrical Stimulation;Therapeutic exercise;Patient/family education;Neuromuscular  re-education;Manual techniques;Scar mobilization;Passive range of motion;Functional mobility training    PT Next Visit Plan Follow MD protocol. MD f/u on 8/26    PT Home Exercise Plan See handouts    Consulted and Agree with  Plan of Care Patient             Patient will benefit from skilled therapeutic intervention in order to improve the following deficits and impairments:  Pain, Decreased mobility, Decreased scar mobility, Hypomobility, Decreased strength, Decreased range of motion, Decreased endurance, Decreased activity tolerance, Impaired flexibility  Visit Diagnosis: Muscle weakness (generalized)  Shoulder joint stiffness, left  Acute pain of right shoulder  Shoulder joint stiffness, right     Problem List Patient Active Problem List   Diagnosis Date Noted   Generalized abdominal pain 06/19/2018   Nausea and vomiting 06/19/2018   Loss of weight 06/19/2018   Headache 12/18/2012   Hypothermia 12/18/2012   Pura Spice, PT, DPT # [id number] Fara Olden SPT 07/04/2021, 9:27 AM   [facility] [address] [place], [place], [address] Phone: [phone]   Fax:  [phone]  Name: Sara Gomez MRN: [id number] Date of Birth: 05-16-1996

## 2021-07-06 ENCOUNTER — Ambulatory Visit: Payer: 59 | Admitting: Physical Therapy

## 2021-07-06 ENCOUNTER — Other Ambulatory Visit: Payer: Self-pay

## 2021-07-06 ENCOUNTER — Encounter: Payer: Self-pay | Admitting: Physical Therapy

## 2021-07-06 DIAGNOSIS — M25611 Stiffness of right shoulder, not elsewhere classified: Secondary | ICD-10-CM | POA: Diagnosis not present

## 2021-07-06 DIAGNOSIS — M6281 Muscle weakness (generalized): Secondary | ICD-10-CM

## 2021-07-06 DIAGNOSIS — M25511 Pain in right shoulder: Secondary | ICD-10-CM

## 2021-07-06 DIAGNOSIS — M25612 Stiffness of left shoulder, not elsewhere classified: Secondary | ICD-10-CM

## 2021-07-06 NOTE — Therapy (Signed)
[facility] [address] [place], [place], [address] Phone: [phone]   Fax:  [phone]  Physical Therapy Treatment  Patient Details  Name: Sara Gomez MRN: [id number] Date of Birth: [birth year] Referring Provider (PT): Dr. Jeannie Fend   Encounter Date: 07/06/2021     PT End of Session - 07/06/21 0727     Visit Number 26    Number of Visits 30    Date for PT Re-Evaluation 07/13/21    Authorization Type [employer]BS COMM Pro (primary); [employer] (secondary); visit limits based on necessity    Authorization Time Period 04/27/21- 06/22/21    Authorization - Visit Number 6    Authorization - Number of Visits 10    PT Start Time 0727 to [id number]   Activity Tolerance Patient tolerated treatment well;Patient limited by pain    Behavior During Therapy [facility] for tasks assessed/performed              Past Medical History:  Diagnosis Date   Abdominal pain 05/23/2018   Nausea and vomiting 05/23/2018   Per NM Hepato w Marjean Donna order    Past Surgical History:  Procedure Laterality Date   COLONOSCOPY  06/07/2018   Dr Alice Reichert   SHOULDER ARTHROSCOPY     x2   SHOULDER LATERJET     x3   TONSILLECTOMY  2005   UPPER GI ENDOSCOPY  06/07/2018   Dr Alice Reichert   WISDOM TOOTH EXTRACTION      There were no vitals filed for this visit.  Pt. states she is somewhat overwelmed taking care of dog after recent spinal surgery/ paralysis. Pt. reports R shoulder pain 3/10 prior to tx. session. Pt. has not completed HEP since last tx. session. Pt. returns to MD for f/u on 8/26.       Treatment:    Ther-ex:   Nautilus: seated lat. Pull downs with 20# wt. Bar 10x2 (cuing to increase shoulder flexion/ static hold stretches).  R sh. flexion 140 deg. Passive holds in seated position.  Standing: 20# tricep extension/ 20# scapular retraction 20x.    Grip strength testing: L 49.6#/ R 55.4#  Supine/ seated R shoulder AROM (all planes)  Supine R shoulder  serratus punches with cuing for technique/ hold time.  10x.       Manual:   STM in supine/ prone to R infraspinatus, teres minor, and rhomboid with R sh. AROM (as tolerated).  Supine R and L UT/ cervical rotation stretches 3x each with 20 second holds.    Supine R shoulder AA/PROM of R shoulder all planes with static holds 10x each. Pain limited flexion/ abduction.  Reassessment of AROM after manual stretches.     Frequent cuing for proper technique with R shoulder ex. And encouragement provided to increase sets/ reps.  Pt. Will continue to benefit from skilled PT services to increase R shoulder ROM/ progression of there.ex. per MD protocol to improve functional mobility.      PT Long Term Goals - 07/07/21 1605       PT LONG TERM GOAL #1   Title Pt. I with HEP to increase R shoulder AROM to [facility] as compared to L shoulder to improve pain-free mobility.    Baseline R sh. limited to 90 deg. flexion/ 45 deg. abduction/ 1 deg. ER per MD protocol. 7/13: Abd 90 deg flex 110 deg. ER 1 8/3: R shoulder flexion:142 deg / ER:25 deg/ IR:60/ abduction: 109 deg.  07/06/2021: R sh. AROM after manual tx.:  flexion 136 deg./ abduction 127 deg./ ER 27 deg./ IR 64 deg.    Time 4    Period Weeks    Status Partially Met    Target Date 07/13/21      PT LONG TERM GOAL #2   Title Pt. will increase R deltoid/ biceps/ triceps strength to grossly 4/5 MMT to improve return to household tasks/ running.    Baseline No R shoulder MMT testing per MD protocol 7/13: R shoulder Flex 4-/5 abd 4-/5 ER 4-/5 IR 4-/5. 8/3: L/R shoulder flexion: 4/4-.  Abduction: 4/3+.  IR/ER: 4/3+.  8/24: no improvement in R sh. MMT noted as compared to last reassessment    Time 4    Period Weeks    Status Partially Met    Target Date 07/13/21      PT LONG TERM GOAL #3   Title Pt. will increase FOTO to 60 to improve R shoulder mobility.    Baseline FOTO: baseline 29 7/13: 56    Time 4    Period Weeks    Status Partially Met    Target  Date 07/13/21      PT LONG TERM GOAL #4   Title Pt. able to complete all work related tasks with no R shoulder limitations or pain to promote return to PLOF.    Baseline Pt. has not returned to work at this time due to R shoulder limitations. 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 completed seated Nautilus ex. lat. pull downs with static holds to promote sh. flexion in pain tolerable range. Pt. c/o moderate B UT muscle tightness and discomfort during STM in supine position. Marked increase in seated R sh. AROM after manual tx.: flexion 136 deg./ abduction 127 deg./ ER 27 deg./ IR 64 deg. Tight end-feel with R sh. ER and pain limited with R shoulder abduction in supine/ seated posture. PT encouraged pt. to complete R sh. HEP to increase R shoulder AROM/ strength per MD protocol. See updated goals.       Patient will benefit from skilled therapeutic intervention in order to improve the following deficits and impairments:  Pain, Decreased mobility, Decreased scar mobility, Hypomobility, Decreased strength, Decreased range of motion, Decreased endurance, Decreased activity tolerance, Impaired flexibility  Visit Diagnosis: Muscle weakness (generalized)  Shoulder joint stiffness, left  Acute pain of right shoulder  Shoulder joint stiffness, right     Problem List Patient Active Problem List   Diagnosis Date Noted   Generalized abdominal pain 06/19/2018   Nausea and vomiting 06/19/2018   Loss of weight 06/19/2018   Headache 12/18/2012   Hypothermia 12/18/2012   Pura Spice, PT, DPT # [phone] 07/07/2021, 4:25 PM  [facility] [address] [place], [place], [address] Phone: [phone]   Fax:  [phone]  Name: Sara Stehr  Gomez MRN: [id number] Date of Birth: [birth year]

## 2021-07-08 ENCOUNTER — Other Ambulatory Visit: Payer: Self-pay

## 2021-07-08 ENCOUNTER — Ambulatory Visit: Payer: 59

## 2021-07-08 DIAGNOSIS — M25611 Stiffness of right shoulder, not elsewhere classified: Secondary | ICD-10-CM

## 2021-07-08 DIAGNOSIS — M6281 Muscle weakness (generalized): Secondary | ICD-10-CM

## 2021-07-08 NOTE — Therapy (Signed)
[facility] [address] [place], [place], [address] Phone: [phone]   Fax:  [phone]  Physical Therapy Treatment  Patient Details  Name: Sara Gomez MRN: [id number] Date of Birth: 06-25-1996 Referring Provider (PT): Dr. Jeannie Fend   Encounter Date: 07/08/2021   PT End of Session - 07/08/21 1327     Visit Number 27    Number of Visits 30    Date for PT Re-Evaluation 07/13/21    Authorization Type [employer]BS COMM Pro (primary); [employer] (secondary); visit limits based on necessity    Authorization Time Period 04/27/21- 06/22/21    Authorization - Visit Number 7    Authorization - Number of Visits 10    PT Start Time 0800    PT Stop Time 0845    PT Time Calculation (min) 45 min    Activity Tolerance Patient tolerated treatment well;Patient limited by pain    Behavior During Therapy [facility] for tasks assessed/performed               Past Medical History:  Diagnosis Date   Abdominal pain 05/23/2018   Nausea and vomiting 05/23/2018   Per NM Hepato w Marjean Donna order    Past Surgical History:  Procedure Laterality Date   COLONOSCOPY  06/07/2018   Dr Alice Reichert   SHOULDER ARTHROSCOPY     x2   SHOULDER LATERJET     x3   TONSILLECTOMY  2005   UPPER GI ENDOSCOPY  06/07/2018   Dr Alice Reichert   WISDOM TOOTH EXTRACTION      There were no vitals filed for this visit.   Subjective Assessment - 07/08/21 0805     Subjective Pt. states she is doing alright today.  She denies any shoulder pain upon arrival.  She did start a new injectable migraine medication and reports some stomach pain since starting this medication.  She returns to see her orthopedic surgeon later this morning.  No specific questions or concerns currently.    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 She has had 3 surgeries on the R shoulder and 4 on the L     Limitations Lifting;Standing;Writing;House hold activities    Patient Stated Goals Increase R shoulder ROM/ strength to improve pain-free mobility.               TREATMENT   Ther-ex Seated R shoulder AAROM canes for flexion, and abduction with static holds x multiple reps in each direction; Nautilus: seated lat. pull downs with bar 30# 2 x 15, 20# high rows with bar 2 x 15, standing 20# tricep extensions 2 x 15;  Supine R shoulder flexion with 1# dumbbell 2 x 15; Supine R shoulder circles CW/CCW x 15 each direction; Supine R shoulder serratus punches 2 x 15;      Manual Therapy  STM in supine and L sidelying to R anterior, lateral, and posterior deltoid as well as supraspinatus, upper trap, infraspinatus, teres minor, and rhomboid major/minor    Pt educated throughout session about proper posture and technique with exercises. Improved exercise technique, movement at target joints, use of target muscles after min to mod verbal, visual, tactile cues.    Patient demonstrates excellent motivation during session today with minimal increase in right shoulder pain.  She is able to progress to additional weight on Nautilus machine during session today.  Also continued with isolated right shoulder strengthening in supine  on mat table using dumbbell.  Continued with soft tissue mobilization to right shoulder and periscapular musculature due to soreness and pain. Patient encouraged to perform HEP despite poor compliance in the past.  Patient will benefit from additional skilled PT services to address deficits in right shoulder pain, weakness, and motor control in order to improve function at home, school, and work.         PT Long Term Goals - 07/07/21 1605       PT LONG TERM GOAL #1   Title Pt. I with HEP to increase R shoulder AROM to [facility] as compared to L shoulder to improve pain-free mobility.    Baseline R sh. limited to 90 deg. flexion/ 45 deg. abduction/ 1 deg. ER per MD protocol.  7/13: Abd 90 deg flex 110 deg. ER 1 8/3: R shoulder flexion:142 deg / ER:25 deg/ IR:60/ abduction: 109 deg.  07/06/2021: R sh. AROM after manual tx.: flexion 136 deg./ abduction 127 deg./ ER 27 deg./ IR 64 deg.    Time 4    Period Weeks    Status Partially Met    Target Date 07/13/21      PT LONG TERM GOAL #2   Title Pt. will increase R deltoid/ biceps/ triceps strength to grossly 4/5 MMT to improve return to household tasks/ running.    Baseline No R shoulder MMT testing per MD protocol 7/13: R shoulder Flex 4-/5 abd 4-/5 ER 4-/5 IR 4-/5. 8/3: L/R shoulder flexion: 4/4-.  Abduction: 4/3+.  IR/ER: 4/3+.  8/24: no improvement in R sh. MMT noted as compared to last reassessment    Time 4    Period Weeks    Status Partially Met    Target Date 07/13/21      PT LONG TERM GOAL #3   Title Pt. will increase FOTO to 60 to improve R shoulder mobility.    Baseline FOTO: baseline 29 7/13: 56    Time 4    Period Weeks    Status Partially Met    Target Date 07/13/21      PT LONG TERM GOAL #4   Title Pt. able to complete all work related tasks with no R shoulder limitations or pain to promote return to PLOF.    Baseline Pt. has not returned to work at this time due to R shoulder limitations. 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  She is able to progress to additional weight on Nautilus machine during session today.  Also continued with isolated right shoulder strengthening in supine on mat table using dumbbell.  Continued with soft tissue mobilization to right shoulder and periscapular musculature due to soreness and pain. Patient  encouraged to perform HEP despite poor compliance in the past.  Patient will benefit from additional skilled PT services to address deficits in right shoulder pain, weakness, and motor control in order to improve function at home, school, and work.    Personal Factors and Comorbidities Fitness    Stability/Clinical Decision Making Stable/Uncomplicated    Rehab Potential  Good    PT Frequency 3x / week    PT Duration 4 weeks    PT Treatment/Interventions ADLs/Self Care Home Management;Aquatic Therapy;Cryotherapy;Electrical Stimulation;Therapeutic exercise;Patient/family education;Neuromuscular re-education;Manual techniques;Scar mobilization;Passive range of motion;Functional mobility training    PT Next Visit Plan Follow MD protocol. MD f/u on 8/26    PT Home Exercise Plan See handouts    Consulted and Agree with Plan of Care Patient              Patient will benefit from skilled therapeutic intervention in order to improve the following deficits and impairments:  Pain, Decreased mobility, Decreased scar mobility, Hypomobility, Decreased strength, Decreased range of motion, Decreased endurance, Decreased activity tolerance, Impaired flexibility  Visit Diagnosis: Muscle weakness (generalized)  Shoulder joint stiffness, right     Problem List Patient Active Problem List   Diagnosis Date Noted   Generalized abdominal pain 06/19/2018   Nausea and vomiting 06/19/2018   Loss of weight 06/19/2018   Headache 12/18/2012   Hypothermia 12/18/2012   Phillips Grout PT, DPT, GCS  07/08/2021, 1:34 PM  [facility] [facility] [address] [place], [place], [address] Phone: [phone]   Fax:  [phone]  Name: Sara Gomez MRN: [id number] Date of Birth: 1996/08/30

## 2021-07-11 ENCOUNTER — Encounter: Payer: 59 | Admitting: Physical Therapy

## 2021-07-12 ENCOUNTER — Encounter: Payer: Self-pay | Admitting: Physical Therapy

## 2021-07-12 ENCOUNTER — Other Ambulatory Visit: Payer: Self-pay

## 2021-07-12 ENCOUNTER — Ambulatory Visit: Payer: 59 | Admitting: Physical Therapy

## 2021-07-12 DIAGNOSIS — M25612 Stiffness of left shoulder, not elsewhere classified: Secondary | ICD-10-CM

## 2021-07-12 DIAGNOSIS — M25511 Pain in right shoulder: Secondary | ICD-10-CM

## 2021-07-12 DIAGNOSIS — G8929 Other chronic pain: Secondary | ICD-10-CM

## 2021-07-12 DIAGNOSIS — M25611 Stiffness of right shoulder, not elsewhere classified: Secondary | ICD-10-CM | POA: Diagnosis not present

## 2021-07-12 DIAGNOSIS — M6281 Muscle weakness (generalized): Secondary | ICD-10-CM

## 2021-07-13 ENCOUNTER — Encounter: Payer: 59 | Admitting: Physical Therapy

## 2021-07-13 NOTE — Therapy (Signed)
[facility] [address] [place], [place], [address] Phone: [phone]   Fax:  [phone]  Physical Therapy Treatment  Patient Details  Name: Sara Gomez MRN: [id number] Date of Birth: 08/13/1996 Referring Provider (PT): Dr. Jeannie Fend   Encounter Date: 07/12/2021   PT End of Session - 07/13/21 0914     Visit Number 28    Number of Visits 30    Date for PT Re-Evaluation 07/13/21    Authorization Type BCBS COMM Pro (primary); UHC (secondary); visit limits based on necessity    Authorization Time Period 04/27/21- 06/22/21    Authorization - Visit Number 8    Authorization - Number of Visits 10    PT Start Time 0809    PT Stop Time 0902    PT Time Calculation (min) 53 min    Activity Tolerance Patient tolerated treatment well;Patient limited by pain    Behavior During Therapy [facility] for tasks assessed/performed             Past Medical History:  Diagnosis Date   Abdominal pain 05/23/2018   Nausea and vomiting 05/23/2018   Per [place] Hepato w Marjean Donna order    Past Surgical History:  Procedure Laterality Date   COLONOSCOPY  06/07/2018   Dr Alice Reichert   SHOULDER ARTHROSCOPY     x2   SHOULDER LATERJET     x3   TONSILLECTOMY  2005   UPPER GI ENDOSCOPY  06/07/2018   Dr Alice Reichert   WISDOM TOOTH EXTRACTION      There were no vitals filed for this visit.   Subjective Assessment - 07/13/21 0903     Subjective Pt. reports no new complaints.  Pt. states MD is happy with pt. progress. No change in POC and will continue to focus on shoulder stability.    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 She has had 3 surgeries on the R shoulder and 4 on the L    Limitations Lifting;Standing;Writing;House hold activities    Patient Stated Goals Increase R shoulder ROM/ strength to improve pain-free mobility.    Currently in Pain?  Yes    Pain Score 3     Pain Location Shoulder    Pain Orientation Right    Pain Descriptors / Indicators Aching;Constant    Pain Type Chronic pain;Surgical pain              TREATMENT   Ther-ex:   Nautilus: seated lat. pull downs with bar 30# 2 x 15, standing 20# tricep extensions 2 x 15; standing scapular retraction 20# 2 x 15 Supine R shoulder flexion with 2# dumbbell 2 x 15; Supine R shoulder serratus punches 2 x 15; Seated R shoulder AAROM canes for flexion, and abduction with static holds x multiple reps in each direction; L sidelying R shoulder ER (no wt.) 15x.   Reassessment of R shoulder AROM in supine and sitting.        Manual Therapy   STM in supine and L sidelying to R anterior, lateral, and posterior deltoid as well as supraspinatus, upper trap, infraspinatus, teres minor, and rhomboid major/minor   R shoulder AA/PROM all planes in supine position (as tolerated)- 5x each direction.       Pt educated throughout session about proper posture and technique with exercises. Improved exercise technique, movement at target joints, use of target muscles after min to mod verbal,  visual, tactile cues.        PT Long Term Goals - 07/07/21 1605       PT LONG TERM GOAL #1   Title Pt. I with HEP to increase R shoulder AROM to [facility] as compared to L shoulder to improve pain-free mobility.    Baseline R sh. limited to 90 deg. flexion/ 45 deg. abduction/ 1 deg. ER per MD protocol. 7/13: Abd 90 deg flex 110 deg. ER 1 8/3: R shoulder flexion:142 deg / ER:25 deg/ IR:60/ abduction: 109 deg.  07/06/2021: R sh. AROM after manual tx.: flexion 136 deg./ abduction 127 deg./ ER 27 deg./ IR 64 deg.    Time 4    Period Weeks    Status Partially Met    Target Date 07/13/21      PT LONG TERM GOAL #2   Title Pt. will increase R deltoid/ biceps/ triceps strength to grossly 4/5 MMT to improve return to household tasks/ running.    Baseline No R shoulder MMT testing per MD protocol 7/13: R  shoulder Flex 4-/5 abd 4-/5 ER 4-/5 IR 4-/5. 8/3: L/R shoulder flexion: 4/4-.  Abduction: 4/3+.  IR/ER: 4/3+.  8/24: no improvement in R sh. MMT noted as compared to last reassessment    Time 4    Period Weeks    Status Partially Met    Target Date 07/13/21      PT LONG TERM GOAL #3   Title Pt. will increase FOTO to 60 to improve R shoulder mobility.    Baseline FOTO: baseline 29 7/13: 56    Time 4    Period Weeks    Status Partially Met    Target Date 07/13/21      PT LONG TERM GOAL #4   Title Pt. able to complete all work related tasks with no R shoulder limitations or pain to promote return to PLOF.    Baseline Pt. has not returned to work at this time due to R shoulder limitations. 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 focused on R shoulder/ scapular stability with use of Nautilus and proper technique.  Pt. fatigues after 20 reps with each exercise and reports increase R sh. discomfort/ fatigue at end of tx.  PT encourages pt. to stay active on a regular basis with HEP/ daily tasks.  Pt. demonstrates marked increase in R shoulder flexion/ abduction after tx./ manual stretching.  B UT muscle tightness noted in seated posture with forward head/ rounded shoulder.    Personal Factors and Comorbidities Fitness    Stability/Clinical Decision Making Stable/Uncomplicated    Clinical Decision Making Low    Rehab Potential Good    PT Frequency 3x / week    PT Duration 4 weeks    PT Treatment/Interventions ADLs/Self Care Home Management;Aquatic Therapy;Cryotherapy;Electrical Stimulation;Therapeutic exercise;Patient/family education;Neuromuscular re-education;Manual techniques;Scar mobilization;Passive range of  motion;Functional mobility training    PT Next Visit Plan RECERT next tx. session    PT Home Exercise Plan See handouts    Consulted and Agree with Plan of Care Patient  Patient will benefit from skilled therapeutic intervention in order to improve the following deficits and impairments:  Pain, Decreased mobility, Decreased scar mobility, Hypomobility, Decreased strength, Decreased range of motion, Decreased endurance, Decreased activity tolerance, Impaired flexibility  Visit Diagnosis: Muscle weakness (generalized)  Shoulder joint stiffness, right  Shoulder joint stiffness, left  Acute pain of right shoulder  Chronic left shoulder pain     Problem List Patient Active Problem List   Diagnosis Date Noted   Generalized abdominal pain 06/19/2018   Nausea and vomiting 06/19/2018   Loss of weight 06/19/2018   Headache 12/18/2012   Hypothermia 12/18/2012   Pura Spice, PT, DPT # [phone] 07/13/2021, 3:11 PM  [facility] [address] [place], [place], [address] Phone: [phone]   Fax:  [phone]  Name: Sara Gomez MRN: [id number] Date of Birth: [birth year]

## 2021-07-14 ENCOUNTER — Ambulatory Visit: Payer: 59 | Attending: Orthopedic Surgery | Admitting: Physical Therapy

## 2021-07-14 ENCOUNTER — Other Ambulatory Visit: Payer: Self-pay

## 2021-07-14 ENCOUNTER — Encounter: Payer: Self-pay | Admitting: Physical Therapy

## 2021-07-14 DIAGNOSIS — M25612 Stiffness of left shoulder, not elsewhere classified: Secondary | ICD-10-CM | POA: Diagnosis present

## 2021-07-14 DIAGNOSIS — M25611 Stiffness of right shoulder, not elsewhere classified: Secondary | ICD-10-CM | POA: Insufficient documentation

## 2021-07-14 DIAGNOSIS — M25512 Pain in left shoulder: Secondary | ICD-10-CM | POA: Diagnosis present

## 2021-07-14 DIAGNOSIS — M25511 Pain in right shoulder: Secondary | ICD-10-CM | POA: Diagnosis present

## 2021-07-14 DIAGNOSIS — G8929 Other chronic pain: Secondary | ICD-10-CM | POA: Insufficient documentation

## 2021-07-14 DIAGNOSIS — M6281 Muscle weakness (generalized): Secondary | ICD-10-CM | POA: Insufficient documentation

## 2021-07-14 NOTE — Therapy (Signed)
[facility] [address] [place], [place], [address] Phone: [phone]   Fax:  [phone]  Physical Therapy Treatment  Patient Details  Name: Sara Gomez MRN: [id number] Date of Birth: 11/19/1995 Referring Provider (PT): Dr. Jeannie Fend   Encounter Date: 07/14/2021   PT End of Session - 07/21/21 0801     Visit Number 29    Number of Visits 40    Date for PT Re-Evaluation 08/11/21    Authorization Type BCBS COMM Pro (primary); UHC (secondary); visit limits based on necessity    Authorization Time Period --    Authorization - Visit Number 9    Authorization - Number of Visits 10    PT Start Time [phone]    PT Stop Time 0906    PT Time Calculation (min) 55 min    Activity Tolerance Patient tolerated treatment well;Patient limited by pain    Behavior During Therapy [facility] for tasks assessed/performed             Past Medical History:  Diagnosis Date   Abdominal pain 05/23/2018   Nausea and vomiting 05/23/2018   Per [place] Hepato w Marjean Donna order    Past Surgical History:  Procedure Laterality Date   COLONOSCOPY  06/07/2018   Dr Alice Reichert   SHOULDER ARTHROSCOPY     x2   SHOULDER LATERJET     x3   TONSILLECTOMY  2005   UPPER GI ENDOSCOPY  06/07/2018   Dr Alice Reichert   WISDOM TOOTH EXTRACTION      There were no vitals filed for this visit.    Pt. reports no new issues. Pt. has job interview next Tuesday at [employer] involving fitness/ exercise class. Minimal c/o R shoulder pain at rest and increase symptoms with shoulder abduction/ flexion >90 deg.       There.ex.:  Exercises  Standing Wall Ball Circles in Scaption with Mini Swiss Ball - 1 x daily - 7 x weekly - 1 sets - 10 reps Shoulder Flexion Wall Slide with Towel - 1 x daily - 7 x weekly - 3 sets - 10 reps Wall Push Up - 1 x daily - 5 x weekly - 3 sets - 10 reps Standing Shoulder Flexion with Resistance - 1 x daily - 5 x weekly - 3 sets - 12 reps Standing  Single Arm Elbow Flexion with Resistance - 1 x daily - 5 x weekly - 3 sets - 12 reps Standing Shoulder External Rotation with Resistance - 1 x daily - 5 x weekly - 3 sets - 12 reps  Nautilus: seated lat. pull downs with bar 30# 2 x 15, standing 20# tricep extensions 2 x 15; standing scapular retraction 20# 2 x 15   Manual tx.:  Supine/ seated R shoulder AA/PROM all planes of movement as tolerated.   STM to R UT/ scapular region in supine and L sidelying position.      PT Long Term Goals - 07/21/21 0955       PT LONG TERM GOAL #1   Title Pt. I with HEP to increase R shoulder AROM to [facility] as compared to L shoulder to improve pain-free mobility.    Baseline R sh. AROM after manual tx.: flexion 138 deg./ abduction 120 deg./ ER 27 deg./ IR 65 deg.    Time 4    Period Weeks    Status Partially Met    Target Date 08/11/21      PT LONG TERM GOAL #2  Title Pt. will increase R deltoid/ biceps/ triceps strength to grossly 4/5 MMT to improve return to household tasks/ running.    Baseline No R shoulder MMT testing per MD protocol 7/13: R shoulder Flex 4-/5 abd 4-/5 ER 4-/5 IR 4-/5. 8/3: L/R shoulder flexion: 4/4-.  Abduction: 4/3+.  IR/ER: 4/3+.  8/24: no improvement in R sh. MMT noted as compared to last reassessment    Time 4    Period Weeks    Status Partially Met    Target Date 08/11/21      PT LONG TERM GOAL #3   Title Pt. will increase FOTO to 60 to improve R shoulder mobility.    Baseline FOTO: baseline 29 7/13: 56    Time 4    Period Weeks    Status Partially Met    Target Date 08/11/21      PT LONG TERM GOAL #4   Title Pt. able to complete all work related tasks with no R shoulder limitations or pain to promote return to PLOF.    Baseline Pt. has not returned to work at this time due to R shoulder limitations. 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 has been progressing well with generalized B shoulder/ scapular strengthening/ posture correction ex. program.  Marked increase in seated R sh. AROM after manual tx.: flexion 138 deg./ abduction 120 deg./ ER 27 deg./ IR 65 deg. Tight end-feel with R sh. ER and pain limited with R shoulder abduction in supine/ seated posture. PT encouraged pt. to complete new R sh. HEP to increase R shoulder AROM/ strength over this upcoming weekend per MD protocol. Pt. will continue to benefit from skilled PT services to increase R shoulder AROM/ stability to improve pain-free functional mobility/ return to work.  See updated goals.    Personal Factors and Comorbidities Fitness    Stability/Clinical Decision Making Stable/Uncomplicated    Clinical Decision Making Low    Rehab Potential Good    PT Frequency 3x / week    PT Duration 4 weeks    PT Treatment/Interventions ADLs/Self Care Home Management;Aquatic Therapy;Cryotherapy;Electrical Stimulation;Therapeutic exercise;Patient/family education;Neuromuscular re-education;Manual techniques;Scar mobilization;Passive range of motion;Functional mobility training    PT Next Visit Plan 10th visit progress note next tx. and discuss HEP ex. and weekend activities.    PT Home Exercise Plan See handouts    Consulted and Agree with Plan of Care Patient             Patient will benefit from skilled therapeutic intervention in order to improve the following deficits and impairments:  Pain, Decreased mobility, Decreased scar mobility, Hypomobility, Decreased strength, Decreased range of motion, Decreased endurance, Decreased activity tolerance, Impaired flexibility  Visit Diagnosis: Muscle weakness (generalized)  Shoulder joint stiffness, right  Shoulder joint stiffness, left  Acute pain of right shoulder     Problem  List Patient Active Problem List   Diagnosis Date Noted   Generalized abdominal pain 06/19/2018   Nausea and vomiting 06/19/2018   Loss of weight 06/19/2018   Headache 12/18/2012   Hypothermia 12/18/2012   Pura Spice, PT, DPT #  [id number] 07/21/2021, 9:59 AM  [facility] [address] [place], [place], [address] Phone: [phone]   Fax:  [phone]  Name: Maleeka Sabatino Mich MRN: [id number] Date of Birth: 1996-06-26

## 2021-07-14 NOTE — Patient Instructions (Signed)
Access Code: [email or web address] URL: [email or web address] Date: 07/14/2021 Prepared by: Dorene Grebe  Exercises Standing Wall [place] Circles in Scaption with Mini Swiss Ball - 1 x daily - 7 x weekly - 1 sets - 10 reps Shoulder Flexion Wall Slide with Towel - 1 x daily - 7 x weekly - 3 sets - 10 reps Wall Push Up - 1 x daily - 5 x weekly - 3 sets - 10 reps Standing Shoulder Flexion with Resistance - 1 x daily - 5 x weekly - 3 sets - 12 reps Standing Single Arm Elbow Flexion with Resistance - 1 x daily - 5 x weekly - 3 sets - 12 reps Standing Shoulder External Rotation with Resistance - 1 x daily - 5 x weekly - 3 sets - 12 reps

## 2021-07-15 ENCOUNTER — Ambulatory Visit: Payer: 59

## 2021-07-20 ENCOUNTER — Ambulatory Visit: Payer: 59 | Admitting: Physical Therapy

## 2021-07-22 ENCOUNTER — Ambulatory Visit: Payer: 59

## 2021-07-25 ENCOUNTER — Ambulatory Visit: Payer: 59 | Admitting: Physical Therapy

## 2021-07-27 ENCOUNTER — Encounter: Payer: Self-pay | Admitting: Physical Therapy

## 2021-07-27 ENCOUNTER — Other Ambulatory Visit: Payer: Self-pay

## 2021-07-27 ENCOUNTER — Ambulatory Visit: Payer: 59 | Admitting: Physical Therapy

## 2021-07-27 DIAGNOSIS — M25511 Pain in right shoulder: Secondary | ICD-10-CM

## 2021-07-27 DIAGNOSIS — M6281 Muscle weakness (generalized): Secondary | ICD-10-CM

## 2021-07-27 DIAGNOSIS — G8929 Other chronic pain: Secondary | ICD-10-CM

## 2021-07-27 DIAGNOSIS — M25512 Pain in left shoulder: Secondary | ICD-10-CM

## 2021-07-27 DIAGNOSIS — M25612 Stiffness of left shoulder, not elsewhere classified: Secondary | ICD-10-CM

## 2021-07-27 DIAGNOSIS — M25611 Stiffness of right shoulder, not elsewhere classified: Secondary | ICD-10-CM

## 2021-07-28 NOTE — Therapy (Signed)
[facility] [address] [place], [place], [address] Phone: [phone]   Fax:  [phone]  Physical Therapy Treatment Physical Therapy Progress Note   Dates of reporting period  06/22/2021  to  07/27/2021  Patient Details  Name: Sara Gomez MRN: [id number] Date of Birth: [birth year] Referring Provider (PT): Dr. Jeannie Fend   Encounter Date: 07/27/2021   PT End of Session - 07/27/21 1400     Visit Number 30    Number of Visits 40    Date for PT Re-Evaluation 08/11/21    Authorization Type BCBS COMM Pro (primary); UHC (secondary); visit limits based on necessity    Authorization - Visit Number 10    Authorization - Number of Visits 10    PT Start Time 0973    PT Stop Time 1446    PT Time Calculation (min) 53 min    Activity Tolerance Patient tolerated treatment well;Patient limited by pain    Behavior During Therapy [facility] for tasks assessed/performed             Past Medical History:  Diagnosis Date   Abdominal pain 05/23/2018   Nausea and vomiting 05/23/2018   Per NM Hepato w Marjean Donna order    Past Surgical History:  Procedure Laterality Date   COLONOSCOPY  06/07/2018   Dr Alice Reichert   SHOULDER ARTHROSCOPY     x2   SHOULDER LATERJET     x3   TONSILLECTOMY  2005   UPPER GI ENDOSCOPY  06/07/2018   Dr Alice Reichert   WISDOM TOOTH EXTRACTION      There were no vitals filed for this visit.   Subjective Assessment - 07/27/21 1359     Subjective Pt. reports R scapular pain (4/10) prior to PT tx. session.  Pt. states she is not sleeping well at night.  Pt. has not been seen by PT since 9/1 due to (+) Covid symptoms/ test after going to [place] for the weekend.  Pt. reports that this is the 3rd time that she has had Covid since 2020.    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 She has had 3 surgeries on the R shoulder  and 4 on the L    Limitations Lifting;Standing;Writing;House hold activities    Patient Stated Goals Increase R shoulder ROM/ strength to improve pain-free mobility.    Currently in Pain? Yes    Pain Score 4     Pain Location Scapula    Pain Orientation Right    Pain Descriptors / Indicators Aching              TREATMENT     Ther-ex:   Seated/ supine R shoulder AROM (all planes of mvmt)- updated ROM/ MMT in goals.  Supine R shoulder flexion and chest press with 3# dumbbell (DB) x 10;  Prone R shoulder extension, horizontal abduction, ER (at 90 in available ROM), and flexion with no wt.2 x 10 each  No Nautilus ex. Today due to increase c/o R scapular/ posterior deltoid pain.    Manual:  Supine R shoulder AA/PROM 10x all planes of movement as tolerated with static holds.  Pain limited with ER/ sh. Abduction.   R UT/shoulder/ biceps/ tricep STM in supine and prone position.  Moderate c/o R posterior deltoid/ subscap pain and trigger points noted.   Trigger Point Dry Needling (TDN), unbilled Education performed with patient regarding potential benefit  of TDN. Reviewed precautions and risks with patient. Reviewed special precautions/risks over lung fields which include pneumothorax. Reviewed signs and symptoms of pneumothorax and advised pt to go to ER immediately if these symptoms develop advise them of dry needling treatment. Extensive time spent with pt to ensure full understanding of TDN risks. Pt provided verbal consent to treatment. Using clean technique TDN performed in prone to R posterior deltoid/ R subscapularis (medial to lateral technique) with sh. In IR (limited ROM) with 3, 0.25 x 60 single needle placements with local twitch response (LTR). Pistoning technique utilized. No pain or soreness reported during tx.       Pt educated throughout session about proper posture and technique with exercises. Improved exercise technique, movement at target joints, use of target  muscles after min to mod verbal, visual, tactile cues.       PT Long Term Goals - 07/28/21 1925       PT LONG TERM GOAL #1   Title Pt. I with HEP to increase R shoulder AROM to [facility] as compared to L shoulder to improve pain-free mobility.    Baseline R sh. AROM after manual tx.: flexion 138 deg./ abduction 120 deg./ ER 27 deg./ IR 65 deg.   9/14: Seated R sh. AROM pre-tx.: flexion 127 deg./ abduction 121 deg./ ER 24 deg./ IR 69 deg. R shoulder PROM: flexion 148 deg. R shoulder AROM post-tx.: flexion 133 deg./ abduction 124 deg./ ER 27 deg./ IR 70 deg.    Time 4    Period Weeks    Status Partially Met    Target Date 08/11/21      PT LONG TERM GOAL #2   Title Pt. will increase R deltoid/ biceps/ triceps strength to grossly 4/5 MMT to improve return to household tasks/ running.    Baseline No R shoulder MMT testing per MD protocol 7/13: R shoulder Flex 4-/5 abd 4-/5 ER 4-/5 IR 4-/5. 8/3: L/R shoulder flexion: 4/4-.  Abduction: 4/3+.  IR/ER: 4/3+.  8/24: no improvement in R sh. MMT noted as compared to last reassessment   9/14: R shoulder muscle weakness noted: flexion 4/5 MMT, abduction 4-/5 MMT (pain), biceps 4+/5 MMT, triceps 4/5 MMT    Time 4    Period Weeks    Status Partially Met    Target Date 08/11/21      PT LONG TERM GOAL #3   Title Pt. will increase FOTO to 60 to improve R shoulder mobility.    Baseline FOTO: baseline 29 7/13: 56    Time 4    Period Weeks    Status On-going    Target Date 08/11/21      PT LONG TERM GOAL #4   Title Pt. able to complete all work related tasks with no R shoulder limitations or pain to promote return to PLOF.    Baseline Pt. has not returned to work at this time due to R shoulder limitations. 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 presents to PT with limited R shoulder AROM/ muscle weakness since not having PT over past 2 weeks.  Seated R  sh. AROM pre-tx.: flexion 127 deg./ abduction 121 deg./ ER 24 deg./ IR 69 deg.  R shoulder PROM: flexion 148 deg.  R shoulder AROM post-tx.: flexion 133 deg./ abduction 124 deg./ ER 27 deg./ IR 70 deg.  Slight increase in R shoulder AROM after stretches/ manual tx.  R shoulder muscle weakness noted: flexion 4/5 MMT, abduction 4-/5 MMT (pain), biceps 4+/5 MMT, triceps 4/5 MMT.  Improved R shoulder AROM with less UT compensatory mvmt./ L lateral lean during seated ex.  Pt. has shown slow but consistent progress towards PT goals.  Pt. will benefit from continued skilled PT services to increase R shoulder ROM/ strength to improve pain-free functional mobility.    Personal Factors and Comorbidities Fitness    Stability/Clinical Decision Making Stable/Uncomplicated    Clinical Decision Making Low    Rehab Potential Good    PT Frequency 3x / week    PT Duration 4 weeks    PT Treatment/Interventions ADLs/Self Care Home Management;Aquatic Therapy;Cryotherapy;Electrical Stimulation;Therapeutic exercise;Patient/family education;Neuromuscular re-education;Manual techniques;Scar mobilization;Passive range of motion;Functional mobility training    PT Next Visit Plan Focus on R shoulder overhead reaching/ strengthening ex.    PT Home Exercise Plan See handouts    Consulted and Agree with Plan of Care Patient             Patient will benefit from skilled therapeutic intervention in order to improve the following deficits and impairments:  Pain, Decreased mobility, Decreased scar mobility, Hypomobility, Decreased strength, Decreased range of motion, Decreased endurance, Decreased activity tolerance, Impaired flexibility  Visit Diagnosis: Muscle weakness (generalized)  Shoulder joint stiffness, right  Shoulder joint stiffness, left  Acute pain  of right shoulder  Chronic left shoulder pain     Problem List Patient Active Problem List   Diagnosis Date Noted   Generalized abdominal pain 06/19/2018   Nausea and vomiting 06/19/2018   Loss of weight 06/19/2018   Headache 12/18/2012   Hypothermia 12/18/2012   Pura Spice, PT, DPT # [id number] 07/28/2021, 7:29 PM  [facility] [address] [place], [place], [address] Phone: [phone]   Fax:  [phone]  Name: Gizel Riedlinger Lehtinen MRN: [id number] Date of Birth: 12-06-1995

## 2021-07-29 ENCOUNTER — Ambulatory Visit: Payer: 59

## 2021-07-29 ENCOUNTER — Other Ambulatory Visit: Payer: Self-pay

## 2021-07-29 DIAGNOSIS — M6281 Muscle weakness (generalized): Secondary | ICD-10-CM

## 2021-07-29 DIAGNOSIS — M25611 Stiffness of right shoulder, not elsewhere classified: Secondary | ICD-10-CM

## 2021-07-29 NOTE — Therapy (Signed)
[facility] [address]. [place], [place], [address] Phone: [phone]   Fax:  [phone]  Physical Therapy Treatment   Patient Details  Name: Sara Gomez MRN: [id number] Date of Birth: 08/16/1996 Referring Provider (PT): Dr. Jeannie Fend   Encounter Date: 07/29/2021   PT End of Session - 07/29/21 0802     Visit Number 31    Number of Visits 40    Date for PT Re-Evaluation 08/11/21    Authorization Type BCBS COMM Pro (primary); UHC (secondary); visit limits based on necessity    Authorization - Visit Number 1    Authorization - Number of Visits 10    PT Start Time 0800    PT Stop Time 0845    PT Time Calculation (min) 45 min    Activity Tolerance Patient tolerated treatment well;Patient limited by pain    Behavior During Therapy [facility] for tasks assessed/performed             Past Medical History:  Diagnosis Date   Abdominal pain 05/23/2018   Nausea and vomiting 05/23/2018   Per NM Hepato w Marjean Donna order    Past Surgical History:  Procedure Laterality Date   COLONOSCOPY  06/07/2018   Dr Alice Reichert   SHOULDER ARTHROSCOPY     x2   SHOULDER LATERJET     x3   TONSILLECTOMY  2005   UPPER GI ENDOSCOPY  06/07/2018   Dr Alice Reichert   WISDOM TOOTH EXTRACTION      There were no vitals filed for this visit.   Subjective Assessment - 07/29/21 0800     Subjective Pt. reports R scapular and R lateral shoulder pain (4/10) prior to PT tx. session. She was a little sore after the TDN last session but not excessive. Mild improvement in pain afterwards. No specific questions or concerns.    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 She has had 3 surgeries on the R shoulder and 4 on the L    Limitations Lifting;Standing;Writing;House hold activities    Patient Stated Goals Increase R shoulder ROM/ strength to improve  pain-free mobility.              TREATMENT     Ther-ex:  Supine R shoulder serratus punch with manual resistance from therapist 2 x 10; Supine R shoulder rhythmic stabilization at wrist with shoulder flexed to 90 degrees 2 x 30s; Prone R shoulder extension, horizontal abduction (high row), ER, IR, and scaption with manual resistance from therapist 2 x 10 each;   Manual Supine R shoulder AAROM canes 10x all planes of movement as tolerated with static holds. R UT/posterior deltoid/infraspinatus/tricep STM in prone position.  Moderate c/o R posterior deltoid/ subscap pain and trigger points noted.    Trigger Point Dry Needling (TDN), unbilled Education performed with patient regarding potential benefit of TDN. Previously reviewed precautions and risks with patient.  Previously reviewed special precautions/risks over lung fields which include pneumothorax.  Previously reviewed signs and symptoms of pneumothorax and advised pt to go to ER immediately if these symptoms develop advise them of dry needling treatment. Pt provided verbal consent to treatment. Using clean technique TDN performed in prone to R posterior deltoid and R subscapularis (medial to lateral technique) with sh. In IR (limited ROM) with towel roll under shoulder to increase scapular wining. Utilizing 1, 0.25 x 60 single needle placement in posterior deltoid and  2, 0.25 x 60 single needle placements in subscapularis. Deep ache reported during all placements. Also performed TDN to R infraspinatus with pt in prone with 1, 0.25 x 40 single needle placement. Contact made with infrascapular fossa. Pistoning technique utilized.     Pt educated throughout session about proper posture and technique with exercises. Improved exercise technique, movement at target joints, use of target muscles after min to mod verbal, visual, tactile cues.   Patient demonstrates excellent motivation during session today with minimal increase in right  shoulder pain with exercise. Continued with isolated right shoulder strengthening in prone and patient continues to demonstrate significant deficits in strength.  Continued with soft tissue mobilization to right shoulder and periscapular musculature due to soreness and pain.  Repeated trigger point dry needling as patient reports she had some mild relief of her pain after was performed during last session and she requested that it be repeated. Patient will benefit from additional skilled PT services to address deficits in right shoulder pain, weakness, and motor control in order to improve function at home, school, and work.      PT Long Term Goals - 07/28/21 1925       PT LONG TERM GOAL #1   Title Pt. I with HEP to increase R shoulder AROM to [facility] as compared to L shoulder to improve pain-free mobility.    Baseline R sh. AROM after manual tx.: flexion 138 deg./ abduction 120 deg./ ER 27 deg./ IR 65 deg.   9/14: Seated R sh. AROM pre-tx.: flexion 127 deg./ abduction 121 deg./ ER 24 deg./ IR 69 deg. R shoulder PROM: flexion 148 deg. R shoulder AROM post-tx.: flexion 133 deg./ abduction 124 deg./ ER 27 deg./ IR 70 deg.    Time 4    Period Weeks    Status Partially Met    Target Date 08/11/21      PT LONG TERM GOAL #2   Title Pt. will increase R deltoid/ biceps/ triceps strength to grossly 4/5 MMT to improve return to household tasks/ running.    Baseline No R shoulder MMT testing per MD protocol 7/13: R shoulder Flex 4-/5 abd 4-/5 ER 4-/5 IR 4-/5. 8/3: L/R shoulder flexion: 4/4-.  Abduction: 4/3+.  IR/ER: 4/3+.  8/24: no improvement in R sh. MMT noted as compared to last reassessment   9/14: R shoulder muscle weakness noted: flexion 4/5 MMT, abduction 4-/5 MMT (pain), biceps 4+/5 MMT, triceps 4/5 MMT    Time 4    Period Weeks    Status Partially Met    Target Date 08/11/21      PT LONG TERM GOAL #3   Title Pt. will increase FOTO to 60 to improve R shoulder mobility.    Baseline FOTO: baseline  29 7/13: 56    Time 4    Period Weeks    Status On-going    Target Date 08/11/21      PT LONG TERM GOAL #4   Title Pt. able to complete all work related tasks with no R shoulder limitations or pain to promote return to PLOF.    Baseline Pt. has not returned to work at this time due to R shoulder limitations. 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 Continued with isolated right shoulder strengthening in prone and patient continues to demonstrate significant deficits in strength.  Continued with soft tissue mobilization to right shoulder and periscapular musculature due to soreness and pain.  Repeated trigger point dry needling as patient reports she had some mild relief of her pain after was performed during last session and she requested that it be repeated. Patient will benefit from additional skilled PT services to address deficits in right shoulder pain, weakness, and motor control in order to improve function at home, school, and work.    Personal Factors and Comorbidities Fitness    Stability/Clinical Decision Making Stable/Uncomplicated    Rehab Potential Good    PT Frequency 3x / week    PT Duration 4 weeks    PT Treatment/Interventions ADLs/Self Care Home Management;Aquatic Therapy;Cryotherapy;Electrical Stimulation;Therapeutic exercise;Patient/family education;Neuromuscular re-education;Manual techniques;Scar mobilization;Passive range of motion;Functional mobility training    PT Next Visit Plan Focus on R shoulder overhead reaching/ strengthening ex.    PT Home Exercise Plan See handouts    Consulted  and Agree with Plan of Care Patient             Patient will benefit from skilled therapeutic intervention in order to improve the following deficits and impairments:  Pain, Decreased mobility, Decreased scar mobility, Hypomobility, Decreased strength, Decreased range of motion, Decreased endurance, Decreased activity tolerance, Impaired flexibility  Visit Diagnosis: Muscle weakness (generalized)  Shoulder joint stiffness, right     Problem List Patient Active Problem List   Diagnosis Date Noted   Generalized abdominal pain 06/19/2018   Nausea and vomiting 06/19/2018   Loss of weight 06/19/2018   Headache 12/18/2012   Hypothermia 12/18/2012   Lyndel Safe Jahyra Sukup PT, DPT, GCS  07/29/2021, 9:48 AM  [facility] [address]. [place], [place], [address] Phone: [phone]   Fax:  [phone]  Name: Sara Gomez MRN: [id number] Date of Birth: 12-Jan-1996

## 2021-08-01 ENCOUNTER — Ambulatory Visit: Payer: 59 | Admitting: Physical Therapy

## 2021-08-03 ENCOUNTER — Ambulatory Visit: Payer: 59 | Admitting: Physical Therapy

## 2021-08-03 ENCOUNTER — Encounter: Payer: Self-pay | Admitting: Physical Therapy

## 2021-08-03 ENCOUNTER — Other Ambulatory Visit: Payer: Self-pay

## 2021-08-03 DIAGNOSIS — M6281 Muscle weakness (generalized): Secondary | ICD-10-CM | POA: Diagnosis not present

## 2021-08-03 DIAGNOSIS — M25511 Pain in right shoulder: Secondary | ICD-10-CM

## 2021-08-03 DIAGNOSIS — M25611 Stiffness of right shoulder, not elsewhere classified: Secondary | ICD-10-CM

## 2021-08-03 DIAGNOSIS — M25612 Stiffness of left shoulder, not elsewhere classified: Secondary | ICD-10-CM

## 2021-08-03 NOTE — Therapy (Signed)
[place] [address] [place], [place], [address] Phone: [phone]   Fax:  [phone]  Physical Therapy Treatment  Patient Details  Name: Sara Gomez MRN: [id number] Date of Birth: July 02, 1996 Referring Provider (PT): Dr. Jeannie Fend   Encounter Date: 08/03/2021   PT End of Session - 08/03/21 0828     Visit Number 32    Number of Visits 40    Date for PT Re-Evaluation 08/11/21    Authorization Type BCBS COMM Pro (primary); UHC (secondary); visit limits based on necessity    Authorization - Visit Number 2    Authorization - Number of Visits 10    PT Start Time [phone]    PT Stop Time 0818    PT Time Calculation (min) 47 min    Activity Tolerance Patient tolerated treatment well;Patient limited by pain    Behavior During Therapy [facility] for tasks assessed/performed             Past Medical History:  Diagnosis Date   Abdominal pain 05/23/2018   Nausea and vomiting 05/23/2018   Per NM Hepato w Marjean Donna order    Past Surgical History:  Procedure Laterality Date   COLONOSCOPY  06/07/2018   Dr Alice Reichert   SHOULDER ARTHROSCOPY     x2   SHOULDER LATERJET     x3   TONSILLECTOMY  2005   UPPER GI ENDOSCOPY  06/07/2018   Dr Alice Reichert   WISDOM TOOTH EXTRACTION      There were no vitals filed for this visit.   Subjective Assessment - 08/03/21 0736     Subjective Pt. reports persistent R shoulder pain and difficulty sleeping at night.  Pt. states she gets up to use the bathroom several times each night.  Pt. reports poor compliance with strengthening HEP.  Pt. reports difficulty/pain in R shoulder with picking up small dog.    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 She has had 3 surgeries on the R shoulder and 4 on the L    Limitations Lifting;Standing;Writing;House hold activities    Patient Stated Goals Increase  R shoulder ROM/ strength to improve pain-free mobility.    Currently in Pain? Yes    Pain Score 4     Pain Location Shoulder    Pain Orientation Right    Pain Descriptors / Indicators Aching    Pain Type Chronic pain;Surgical pain               Ther.ex.:  Seated B shoulder flexion/ abduction 5x (scapular mvmt. Reassessment)  Nautilus:  Seated lat. Pull down 30# 12x2.   Standing tricep extension 20# 12x2.   Standing B shoulder adduction 20# 12x2.   Standing R single handle diagonals 20# 15x.   Standing scapular retraction 30# 12x2.  Moderate fatigue in R sh.  Seated 3# bicep curls 20x.  B shoulder AROM reassessment (all planes)  Supine 3# serratus punches 15x (difficulty) Supine 1# shoulder flexion/ tricep extension 15x.  L sidelying R shoulder ER 10x (difficulty/ limited)  Manual tx.:  Supine cervical UT/levator/ rotn. Stretches L/R 3x each. R shoulder AA/PROM all planes of movement 8x (pain tolerable)- limited with ER     PT Long Term Goals - 07/28/21 1925       PT LONG TERM GOAL #1   Title Pt. I with HEP to increase R shoulder AROM to [facility] as compared to L  shoulder to improve pain-free mobility.    Baseline R sh. AROM after manual tx.: flexion 138 deg./ abduction 120 deg./ ER 27 deg./ IR 65 deg.   9/14: Seated R sh. AROM pre-tx.: flexion 127 deg./ abduction 121 deg./ ER 24 deg./ IR 69 deg. R shoulder PROM: flexion 148 deg. R shoulder AROM post-tx.: flexion 133 deg./ abduction 124 deg./ ER 27 deg./ IR 70 deg.    Time 4    Period Weeks    Status Partially Met    Target Date 08/11/21      PT LONG TERM GOAL #2   Title Pt. will increase R deltoid/ biceps/ triceps strength to grossly 4/5 MMT to improve return to household tasks/ running.    Baseline No R shoulder MMT testing per MD protocol 7/13: R shoulder Flex 4-/5 abd 4-/5 ER 4-/5 IR 4-/5. 8/3: L/R shoulder flexion: 4/4-.  Abduction: 4/3+.  IR/ER: 4/3+.  8/24: no improvement in R sh. MMT noted as compared to last  reassessment   9/14: R shoulder muscle weakness noted: flexion 4/5 MMT, abduction 4-/5 MMT (pain), biceps 4+/5 MMT, triceps 4/5 MMT    Time 4    Period Weeks    Status Partially Met    Target Date 08/11/21      PT LONG TERM GOAL #3   Title Pt. will increase FOTO to 60 to improve R shoulder mobility.    Baseline FOTO: baseline 29 7/13: 56    Time 4    Period Weeks    Status On-going    Target Date 08/11/21      PT LONG TERM GOAL #4   Title Pt. able to complete all work related tasks with no R shoulder limitations or pain to promote return to PLOF.    Baseline Pt. has not returned to work at this time due to R shoulder limitations. 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 limited by moderate R shoulder muscle fatigue/ weakness and generalized shoulder pain with all overhead and resisted ex.  Pt. required frequent cuing t/o tx. to complete sets/reps for all movement patterns at Nautilus.  PT encouraged pt. to participate with resisted ther.ex. every other day to improve R shoulder stablity and pain-free mobility.  Good cervical AROM all planes of movement in supine/ seated.  Limited resistance (1#) with controlled L sidelying R shoulder ER/IR.  Good R scapular mobility noted with active R shoulder abduction/ horizontal adduction in seated posture.  Pt. will continue to benefit from skilled PT to focus on R shoulder/ scapular strengthening to improve pain-free mobility.    Personal Factors and Comorbidities Fitness    Stability/Clinical Decision Making Stable/Uncomplicated    Clinical Decision Making Low    Rehab Potential Good    PT Frequency 3x / week    PT Duration 4 weeks    PT Treatment/Interventions ADLs/Self Care Home  Management;Aquatic Therapy;Cryotherapy;Electrical Stimulation;Therapeutic exercise;Patient/family education;Neuromuscular re-education;Manual techniques;Scar mobilization;Passive range of motion;Functional mobility training    PT Next Visit Plan Focus on R shoulder overhead reaching/ strengthening ex.    PT Home Exercise Plan See handouts    Consulted  and Agree with Plan of Care Patient             Patient will benefit from skilled therapeutic intervention in order to improve the following deficits and impairments:  Pain, Decreased mobility, Decreased scar mobility, Hypomobility, Decreased strength, Decreased range of motion, Decreased endurance, Decreased activity tolerance, Impaired flexibility  Visit Diagnosis: Muscle weakness (generalized)  Shoulder joint stiffness, right  Shoulder joint stiffness, left  Acute pain of right shoulder     Problem List Patient Active Problem List   Diagnosis Date Noted   Generalized abdominal pain 06/19/2018   Nausea and vomiting 06/19/2018   Loss of weight 06/19/2018   Headache 12/18/2012   Hypothermia 12/18/2012   Pura Spice, PT, DPT # [phone] 08/03/2021, 10:24 AM  [facility] [address] [place], [place], [address] Phone: [phone]   Fax:  [phone]  Name: Sara Gomez MRN: [id number] Date of Birth: 11-28-1995

## 2021-08-05 ENCOUNTER — Ambulatory Visit: Payer: 59

## 2021-08-05 ENCOUNTER — Other Ambulatory Visit: Payer: Self-pay

## 2021-08-05 DIAGNOSIS — M6281 Muscle weakness (generalized): Secondary | ICD-10-CM

## 2021-08-05 DIAGNOSIS — M25611 Stiffness of right shoulder, not elsewhere classified: Secondary | ICD-10-CM

## 2021-08-05 DIAGNOSIS — M25511 Pain in right shoulder: Secondary | ICD-10-CM

## 2021-08-05 NOTE — Therapy (Signed)
[facility] [address] [place], [place], [address] Phone: [phone]   Fax:  [phone]  Physical Therapy Treatment   Patient Details  Name: Sara Gomez MRN: [id number] Date of Birth: [birth year] Referring Provider (PT): Dr. Jeannie Fend   Encounter Date: 08/05/2021   PT End of Session - 08/05/21 0828     Visit Number 33    Number of Visits 40    Date for PT Re-Evaluation 08/11/21    Authorization Type BCBS COMM Pro (primary); UHC (secondary); visit limits based on necessity    Authorization - Visit Number 3    Authorization - Number of Visits 10    PT Start Time 0759    PT Stop Time 0845    PT Time Calculation (min) 46 min    Activity Tolerance Patient tolerated treatment well;Patient limited by pain    Behavior During Therapy [facility] for tasks assessed/performed              Past Medical History:  Diagnosis Date   Abdominal pain 05/23/2018   Nausea and vomiting 05/23/2018   Per NM Hepato w Marjean Donna order    Past Surgical History:  Procedure Laterality Date   COLONOSCOPY  06/07/2018   Dr Alice Reichert   SHOULDER ARTHROSCOPY     x2   SHOULDER LATERJET     x3   TONSILLECTOMY  2005   UPPER GI ENDOSCOPY  06/07/2018   Dr Alice Reichert   WISDOM TOOTH EXTRACTION      There were no vitals filed for this visit.   Subjective Assessment - 08/05/21 0800     Subjective Pt reports 4/10 R shoulder pain upon arrival which has been chronic s/p surgery. She reports poor compliance with strengthening HEP. No specific questions or concerns currently.    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 She has had 3 surgeries on the R shoulder and 4 on the L    Limitations Lifting;Standing;Writing;House hold activities    Patient Stated Goals Increase R shoulder ROM/ strength to improve pain-free mobility.                TREATMENT     Ther-ex Seated AAROM flexion and scaption with cane x multiple bouts each;  Nautilus: Seated lat. pull down 30# x 12, x 15; Seated high rows 30# 2 x 12; Standing tricep extension 20# 2 x 15; Standing chest press 20# 2 x 10 Standing bicep curl 10# x 10, second set deferred due to fatigue;  Supine R shoulder serratus punch with 3# dumbbell (DB) 2 x 10; Supine R shoulder circles with 3# DB CW/CCW 2 x 10 each; Supine R shoulder flexion from neutral to 90 2# DB 2 x 10 (attempted 3# but pt unable to perform)   Manual. R posterior deltoid/infraspinatus/tricep/rhomboids STM in L sidelying position;     Pt educated throughout session about proper posture and technique with exercises. Improved exercise technique, movement at target joints, use of target muscles after min to mod verbal, visual, tactile cues.   Patient demonstrates excellent motivation during session today with minimal increase in right shoulder pain with exercise. She does fatigue relatively easily with strengthening. Increased weight and repetitions with strengthening. Patient continues to demonstrate significant deficits in strength.  Continued with soft tissue mobilization to right shoulder and periscapular musculature due to soreness and pain. Patient will benefit from additional skilled PT services to  address deficits in right shoulder pain, weakness, and motor control in order to improve function at home, school, and work.      PT Long Term Goals - 07/28/21 1925       PT LONG TERM GOAL #1   Title Pt. I with HEP to increase R shoulder AROM to [facility] as compared to L shoulder to improve pain-free mobility.    Baseline R sh. AROM after manual tx.: flexion 138 deg./ abduction 120 deg./ ER 27 deg./ IR 65 deg.   9/14: Seated R sh. AROM pre-tx.: flexion 127 deg./ abduction 121 deg./ ER 24 deg./ IR 69 deg. R shoulder PROM: flexion 148 deg. R shoulder AROM post-tx.: flexion 133 deg./ abduction 124 deg./ ER  27 deg./ IR 70 deg.    Time 4    Period Weeks    Status Partially Met    Target Date 08/11/21      PT LONG TERM GOAL #2   Title Pt. will increase R deltoid/ biceps/ triceps strength to grossly 4/5 MMT to improve return to household tasks/ running.    Baseline No R shoulder MMT testing per MD protocol 7/13: R shoulder Flex 4-/5 abd 4-/5 ER 4-/5 IR 4-/5. 8/3: L/R shoulder flexion: 4/4-.  Abduction: 4/3+.  IR/ER: 4/3+.  8/24: no improvement in R sh. MMT noted as compared to last reassessment   9/14: R shoulder muscle weakness noted: flexion 4/5 MMT, abduction 4-/5 MMT (pain), biceps 4+/5 MMT, triceps 4/5 MMT    Time 4    Period Weeks    Status Partially Met    Target Date 08/11/21      PT LONG TERM GOAL #3   Title Pt. will increase FOTO to 60 to improve R shoulder mobility.    Baseline FOTO: baseline 29 7/13: 56    Time 4    Period Weeks    Status On-going    Target Date 08/11/21      PT LONG TERM GOAL #4   Title Pt. able to complete all work related tasks with no R shoulder limitations or pain to promote return to PLOF.    Baseline Pt. has not returned to work at this time due to R shoulder limitations. 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 She does fatigue relatively easily with strengthening. Increased weight and repetitions with strengthening. Patient continues to demonstrate significant deficits in strength.  Continued with soft tissue mobilization to right shoulder and periscapular musculature due to soreness and pain. Patient will benefit from additional skilled PT services to address deficits in  right shoulder pain, weakness, and motor control in order to improve function at home, school, and work.    Personal Factors and Comorbidities Fitness    Stability/Clinical Decision Making Stable/Uncomplicated    Rehab Potential Good    PT Frequency 3x / week    PT Duration 4 weeks    PT Treatment/Interventions ADLs/Self Care Home Management;Aquatic Therapy;Cryotherapy;Electrical Stimulation;Therapeutic exercise;Patient/family education;Neuromuscular re-education;Manual techniques;Scar mobilization;Passive range of motion;Functional mobility training    PT  Next Visit Plan Focus on R shoulder overhead reaching/ strengthening ex.    PT Home Exercise Plan See handouts    Consulted and Agree with Plan of Care Patient              Patient will benefit from skilled therapeutic intervention in order to improve the following deficits and impairments:  Pain, Decreased mobility, Decreased scar mobility, Hypomobility, Decreased strength, Decreased range of motion, Decreased endurance, Decreased activity tolerance, Impaired flexibility  Visit Diagnosis: Muscle weakness (generalized)  Shoulder joint stiffness, right  Acute pain of right shoulder     Problem List Patient Active Problem List   Diagnosis Date Noted   Generalized abdominal pain 06/19/2018   Nausea and vomiting 06/19/2018   Loss of weight 06/19/2018   Headache 12/18/2012   Hypothermia 12/18/2012   Phillips Grout PT, DPT, GCS  08/05/2021, 3:59 PM  [place] [facility] [address] [place], [place], [address] Phone: [phone]   Fax:  [phone]  Name: Karey Suthers Tates MRN: [id number] Date of Birth: 05/04/1996

## 2021-08-08 ENCOUNTER — Other Ambulatory Visit: Payer: Self-pay

## 2021-08-08 ENCOUNTER — Encounter: Payer: Self-pay | Admitting: Physical Therapy

## 2021-08-08 ENCOUNTER — Ambulatory Visit: Payer: 59 | Admitting: Physical Therapy

## 2021-08-08 DIAGNOSIS — M6281 Muscle weakness (generalized): Secondary | ICD-10-CM

## 2021-08-08 DIAGNOSIS — M25511 Pain in right shoulder: Secondary | ICD-10-CM

## 2021-08-08 DIAGNOSIS — M25611 Stiffness of right shoulder, not elsewhere classified: Secondary | ICD-10-CM

## 2021-08-08 NOTE — Therapy (Signed)
[facility] [address] [place], [place], [address] Phone: [phone]   Fax:  [phone]  Physical Therapy Treatment  Patient Details  Name: Sara Gomez MRN: [id number] Date of Birth: 1996-04-29 Referring Provider (PT): Dr. Jeannie Fend   Encounter Date: 08/08/2021   PT End of Session - 08/08/21 0907     Visit Number 34    Number of Visits 40    Date for PT Re-Evaluation 08/11/21    Authorization Type BCBS COMM Pro (primary); UHC (secondary); visit limits based on necessity    Authorization - Visit Number 4    Authorization - Number of Visits 10    PT Start Time 0732    PT Stop Time 0814    PT Time Calculation (min) 42 min    Activity Tolerance Patient tolerated treatment well;Patient limited by pain    Behavior During Therapy [facility] for tasks assessed/performed             Past Medical History:  Diagnosis Date   Abdominal pain 05/23/2018   Nausea and vomiting 05/23/2018   Per NM Hepato w Marjean Donna order    Past Surgical History:  Procedure Laterality Date   COLONOSCOPY  06/07/2018   Dr Alice Reichert   SHOULDER ARTHROSCOPY     x2   SHOULDER LATERJET     x3   TONSILLECTOMY  2005   UPPER GI ENDOSCOPY  06/07/2018   Dr Alice Reichert   WISDOM TOOTH EXTRACTION      There were no vitals filed for this visit.   Subjective Assessment - 08/08/21 0820     Subjective Pt. states she is tired this morning and not wanting to do strengthening ex.   PT expressed importance of R shoulder strengthening and pt. agreed to complete a few Nautilus based ex. for sh./scapular strengthening.    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 She has had 3 surgeries on the R shoulder and 4 on the L    Limitations Lifting;Standing;Writing;House hold activities    Patient Stated Goals Increase R shoulder ROM/ strength to improve pain-free  mobility.    Currently in Pain? Yes    Pain Score 4     Pain Location Shoulder    Pain Orientation Right               Ther.ex.:  Nautilus: Seated lat pull down 30# 15x2.  Standing B sh. Adduction 20# 18x.  Stand tricep extension 30#: 15x.  Standing scap. Retraction 30#: 12x (pt. States she is tired).    Supine R shoulder AROM (all planes).    Manual tx.:  Supine R shoulder AA/PROM 10x with slight overpressure to tolerable end-range.  Pain limited with shoulder ER.  STM to R posterior deltoid musculature.       PT Long Term Goals - 07/28/21 1925       PT LONG TERM GOAL #1   Title Pt. I with HEP to increase R shoulder AROM to [facility] as compared to L shoulder to improve pain-free mobility.    Baseline R sh. AROM after manual tx.: flexion 138 deg./ abduction 120 deg./ ER 27 deg./ IR 65 deg.   9/14: Seated R sh. AROM pre-tx.: flexion 127 deg./ abduction 121 deg./ ER 24 deg./ IR 69 deg. R shoulder PROM: flexion 148 deg. R shoulder AROM post-tx.: flexion 133 deg./ abduction 124 deg./ ER 27 deg./ IR 70 deg.  Time 4    Period Weeks    Status Partially Met    Target Date 08/11/21      PT LONG TERM GOAL #2   Title Pt. will increase R deltoid/ biceps/ triceps strength to grossly 4/5 MMT to improve return to household tasks/ running.    Baseline No R shoulder MMT testing per MD protocol 7/13: R shoulder Flex 4-/5 abd 4-/5 ER 4-/5 IR 4-/5. 8/3: L/R shoulder flexion: 4/4-.  Abduction: 4/3+.  IR/ER: 4/3+.  8/24: no improvement in R sh. MMT noted as compared to last reassessment   9/14: R shoulder muscle weakness noted: flexion 4/5 MMT, abduction 4-/5 MMT (pain), biceps 4+/5 MMT, triceps 4/5 MMT    Time 4    Period Weeks    Status Partially Met    Target Date 08/11/21      PT LONG TERM GOAL #3   Title Pt. will increase FOTO to 60 to improve R shoulder mobility.    Baseline FOTO: baseline 29 7/13: 56    Time 4    Period Weeks    Status On-going    Target Date 08/11/21      PT LONG  TERM GOAL #4   Title Pt. able to complete all work related tasks with no R shoulder limitations or pain to promote return to PLOF.    Baseline Pt. has not returned to work at this time due to R shoulder limitations. 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 at Nautilus secondary to pt. request.  R shoulder crepitus and pain with repeated shoulder flexion/ abduction in all positions.  Pt. completed 16 min. of ther.ex. and 14 min. of manual stretches, with focus on shoulder ER/ scapular mobility.  Persistent R shoulder pain with end-range flexion/ abduction and >25 deg. ER in supine position.  Trigger point noted in R posterior deltoid musculature with STM during shoulder IR/ER.  No change to HEP at this time.    Personal Factors and Comorbidities Fitness    Stability/Clinical Decision Making Stable/Uncomplicated    Clinical Decision Making Low    Rehab Potential Good    PT Frequency 3x / week    PT Duration 4 weeks    PT Treatment/Interventions ADLs/Self Care Home Management;Aquatic Therapy;Cryotherapy;Electrical Stimulation;Therapeutic exercise;Patient/family education;Neuromuscular re-education;Manual techniques;Scar mobilization;Passive range of motion;Functional mobility training    PT Next Visit Plan Focus on R shoulder overhead reaching/ strengthening ex.    PT Home Exercise Plan See handouts    Consulted and Agree with Plan of Care Patient             Patient will benefit from skilled therapeutic intervention in order to improve the following deficits and impairments:  Pain, Decreased mobility, Decreased scar mobility, Hypomobility, Decreased strength, Decreased range of motion, Decreased endurance,  Decreased activity tolerance, Impaired flexibility  Visit Diagnosis: Muscle weakness (generalized)  Shoulder joint stiffness, right  Acute pain of right shoulder     Problem List Patient Active Problem List   Diagnosis Date Noted   Generalized abdominal pain 06/19/2018   Nausea and vomiting 06/19/2018   Loss of  weight 06/19/2018   Headache 12/18/2012   Hypothermia 12/18/2012   Pura Spice, PT, DPT # [phone] 08/08/2021, 9:35 AM  [facility] [address] [place], [place], [address] Phone: [phone]   Fax:  [phone]  Name: Sara Gomez MRN: [id number] Date of Birth: [birth year]

## 2021-08-10 ENCOUNTER — Other Ambulatory Visit: Payer: Self-pay

## 2021-08-10 ENCOUNTER — Ambulatory Visit: Payer: 59 | Admitting: Physical Therapy

## 2021-08-10 DIAGNOSIS — M6281 Muscle weakness (generalized): Secondary | ICD-10-CM

## 2021-08-10 DIAGNOSIS — M25511 Pain in right shoulder: Secondary | ICD-10-CM

## 2021-08-10 DIAGNOSIS — M25611 Stiffness of right shoulder, not elsewhere classified: Secondary | ICD-10-CM

## 2021-08-11 ENCOUNTER — Encounter: Payer: Self-pay | Admitting: Physical Therapy

## 2021-08-11 NOTE — Therapy (Signed)
[facility] [address] [place], [place], [address] Phone: [phone]   Fax:  [phone]  Physical Therapy Treatment  Patient Details  Name: Tea Collums Lipscomb MRN: [id number] Date of Birth: 07/06/1996 Referring Provider (PT): Dr. Jeannie Fend   Encounter Date: 08/10/2021   PT End of Session - 08/11/21 1047     Visit Number 35    Number of Visits 40    Date for PT Re-Evaluation 08/11/21    Authorization Type BCBS COMM Pro (primary); UHC (secondary); visit limits based on necessity    Authorization - Visit Number 5    Authorization - Number of Visits 10    PT Start Time [phone]    PT Stop Time 0950    PT Time Calculation (min) 19 min    Activity Tolerance Patient tolerated treatment well;Patient limited by pain    Behavior During Therapy [facility] for tasks assessed/performed             Past Medical History:  Diagnosis Date   Abdominal pain 05/23/2018   Nausea and vomiting 05/23/2018   Per NM Hepato w Marjean Donna order    Past Surgical History:  Procedure Laterality Date   COLONOSCOPY  06/07/2018   Dr Alice Reichert   SHOULDER ARTHROSCOPY     x2   SHOULDER LATERJET     x3   TONSILLECTOMY  2005   UPPER GI ENDOSCOPY  06/07/2018   Dr Alice Reichert   WISDOM TOOTH EXTRACTION      There were no vitals filed for this visit.   Subjective Assessment - 08/11/21 1032     Subjective Pt. arrived to PT after scheduled PT appt. and states she wants a little PT today.  Pt. reports limited sleep prior to PT due to going out to concert in [place] last night.  Pt. states she continues to have persistent R shoulder/scapular discomfort with daily tasks/ sleeping.    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 She has had 3 surgeries on the R shoulder and 4 on the L    Limitations Lifting;Standing;Writing;House hold activities    Patient Stated  Goals Increase R shoulder ROM/ strength to improve pain-free mobility.    Currently in Pain? Yes    Pain Score 4     Pain Location Shoulder    Pain Orientation Right             Tx. Limited by time and no charge for tx. Session today.  PT will reassess goals/ discuss POC next tx. Session.     Ther.ex.:   Nautilus: Standing lat pull down 30# 20x.  Stand tricep extension 30# 20x.  Standing scap. Retraction 30# 20x.  Standing R single arm bicep curls 10# 20x.     Supine R shoulder AROM (all planes).          PT Long Term Goals - 07/28/21 1925       PT LONG TERM GOAL #1   Title Pt. I with HEP to increase R shoulder AROM to [facility] as compared to L shoulder to improve pain-free mobility.    Baseline R sh. AROM after manual tx.: flexion 138 deg./ abduction 120 deg./ ER 27 deg./ IR 65 deg.   9/14: Seated R sh. AROM pre-tx.: flexion 127 deg./ abduction 121 deg./ ER 24 deg./ IR 69 deg. R shoulder PROM: flexion 148 deg. R shoulder AROM post-tx.: flexion 133 deg./ abduction  124 deg./ ER 27 deg./ IR 70 deg.    Time 4    Period Weeks    Status Partially Met    Target Date 08/11/21      PT LONG TERM GOAL #2   Title Pt. will increase R deltoid/ biceps/ triceps strength to grossly 4/5 MMT to improve return to household tasks/ running.    Baseline No R shoulder MMT testing per MD protocol 7/13: R shoulder Flex 4-/5 abd 4-/5 ER 4-/5 IR 4-/5. 8/3: L/R shoulder flexion: 4/4-.  Abduction: 4/3+.  IR/ER: 4/3+.  8/24: no improvement in R sh. MMT noted as compared to last reassessment   9/14: R shoulder muscle weakness noted: flexion 4/5 MMT, abduction 4-/5 MMT (pain), biceps 4+/5 MMT, triceps 4/5 MMT    Time 4    Period Weeks    Status Partially Met    Target Date 08/11/21      PT LONG TERM GOAL #3   Title Pt. will increase FOTO to 60 to improve R shoulder mobility.    Baseline FOTO: baseline 29 7/13: 56    Time 4    Period Weeks    Status On-going    Target Date 08/11/21      PT LONG TERM  GOAL #4   Title Pt. able to complete all work related tasks with no R shoulder limitations or pain to promote return to PLOF.    Baseline Pt. has not returned to work at this time due to R shoulder limitations. 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 today.  Tx focused on standing Nautilus resisted ex. and supine R shoulder stretches/ discussion of pain in R UT/scapular region.  Pt. limited to 1 set of 20 reps with all shoulder strengthening secondary to fatigue/ pain. Pain limited with R shoulder ER/ flexion.  PT will reassess goals/ discuss POC next tx. session.    Personal Factors and Comorbidities Fitness    Stability/Clinical Decision Making Stable/Uncomplicated    Clinical Decision Making Low    Rehab Potential Good    PT Frequency 3x / week    PT Duration 4 weeks    PT Treatment/Interventions ADLs/Self Care Home Management;Aquatic Therapy;Cryotherapy;Electrical Stimulation;Therapeutic exercise;Patient/family education;Neuromuscular re-education;Manual techniques;Scar mobilization;Passive range of motion;Functional mobility training    PT Next Visit Plan Focus on R shoulder overhead reaching/ strengthening ex.  RECERT next tx. session.  Discuss POC and possible discharge with focus on gym based/HEP.    PT Home Exercise Plan See handouts    Consulted and Agree with Plan of Care Patient             Patient will benefit from skilled therapeutic intervention in order to improve the following deficits and impairments:  Pain, Decreased mobility, Decreased scar mobility, Hypomobility, Decreased strength, Decreased range of motion, Decreased endurance, Decreased activity tolerance, Impaired  flexibility  Visit Diagnosis: Muscle weakness (generalized)  Shoulder joint stiffness, right  Acute pain of right shoulder     Problem List Patient Active Problem List   Diagnosis Date Noted   Generalized abdominal pain 06/19/2018  Nausea and vomiting 06/19/2018   Loss of weight 06/19/2018   Headache 12/18/2012   Hypothermia 12/18/2012   Pura Spice, PT, DPT # [phone] 08/11/2021, 11:01 AM  [facility] [address] [place], [place], [address] Phone: [phone]   Fax:  [phone]  Name: Donyae Kohn Pella MRN: [id number] Date of Birth: 29-Sep-1996

## 2021-08-12 ENCOUNTER — Ambulatory Visit: Payer: 59

## 2021-08-12 ENCOUNTER — Other Ambulatory Visit: Payer: Self-pay

## 2021-08-12 DIAGNOSIS — M25611 Stiffness of right shoulder, not elsewhere classified: Secondary | ICD-10-CM

## 2021-08-12 DIAGNOSIS — M6281 Muscle weakness (generalized): Secondary | ICD-10-CM

## 2021-08-12 NOTE — Therapy (Signed)
[facility] [address] [place], [place], [address] Phone: [phone]   Fax:  [phone]  Physical Therapy Treatment/Recertification  Patient Details  Name: Sara Gomez MRN: [id number] Date of Birth: 1996/04/19 Referring Provider (PT): Dr. Jeannie Fend   Encounter Date: 08/12/2021   PT End of Session - 08/12/21 0853     Visit Number 36    Number of Visits 52    Date for PT Re-Evaluation 09/09/21    Authorization Type BCBS COMM Pro (primary); UHC (secondary); visit limits based on necessity    Authorization - Visit Number 6    Authorization - Number of Visits 10    PT Start Time 0845    PT Stop Time 0930    PT Time Calculation (min) 45 min    Activity Tolerance Patient tolerated treatment well;Patient limited by pain    Behavior During Therapy [facility] for tasks assessed/performed             Past Medical History:  Diagnosis Date   Abdominal pain 05/23/2018   Nausea and vomiting 05/23/2018   Per NM Hepato w Marjean Donna order    Past Surgical History:  Procedure Laterality Date   COLONOSCOPY  06/07/2018   Dr Alice Reichert   SHOULDER ARTHROSCOPY     x2   SHOULDER LATERJET     x3   TONSILLECTOMY  2005   UPPER GI ENDOSCOPY  06/07/2018   Dr Alice Reichert   WISDOM TOOTH EXTRACTION      There were no vitals filed for this visit.   Subjective Assessment - 08/12/21 0849     Subjective Pt. states she continues to have persistent R shoulder/scapular discomfort. She is tired upon arrival today but otherwise no specific questions currently. She rates her R shoulder pain as 3/10 upon arrival.    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 She has had 3 surgeries on the R shoulder and 4 on the L    Limitations Lifting;Standing;Writing;House hold activities    Patient Stated Goals Increase R shoulder ROM/ strength to improve  pain-free mobility.                 TREATMENT     Ther-ex Seated AAROM flexion and scaption with cane x multiple bouts each;  Nautilus: Standing tricep extension 20# 2 x 15; Standing rows 20# 2 x 15; Standing chest press 20# 2 x 15; Standing R bicep curls 10# 2 x 10;  Seated R shoulder AAROM canes for flexion and abduction x 3 minutes;  Seated bilateral shoulder flexion from neutral to 90 with 2# dumbbells (DB) 2 x 10; Seated bilateral shoulder abduction with 2# DB 2 x 10;  Supine R shoulder serratus punch with manual resistance 2 x 10; Supine R shoulder rhythmic stabilization at 90 flexion 30s x 2;  Outcome measures/goals updated with patient (see goal section);   Manual. Supine R shoulder PROM into flexion and scaption within pain-free tolerance and never pushing through resistance; Gentle R shoulder distraction for pain modulation while moving through passive abduction; R shoulder AP grade I-II, mobilizations with shoulder at neutral, 20s/bout x 2 bouts; R shoulder superior to inferior grade I-II mobilizations with shoulder at 90 abduction to improve abduction ROM, 20s/bout x 2 bouts; R posterior deltoid/infraspinatus/tricep/rhomboids/upper traps STM in prone position;     Pt educated throughout session about proper posture and technique with exercises. Improved exercise technique, movement  at target joints, use of target muscles after min to mod verbal, visual, tactile cues.   Pt. presents to PT with limited R shoulder AROM/ muscle weakness. Updated FOTO with patient which worsens slightly today to 57. Right shoulder range of motion and strength were last updated on 9/14 and at that time:  Seated R sh. AROM pre-tx.: flexion 127 deg./ abduction 121 deg./ ER 24 deg./ IR 69 deg.  R shoulder PROM: flexion 148 deg.  R shoulder AROM post-tx.: flexion 133 deg./ abduction 124 deg./ ER 27 deg./ IR 70 deg.  .  R shoulder muscle weakness noted: flexion 4/5 MMT, abduction 4-/5  MMT (pain), biceps 4+/5 MMT, triceps 4/5 MMT.  She is demonstrating improved R shoulder AROM with less UT compensatory mvmt./ L lateral lean during seated ex.  Pt. has shown slow but consistent progress towards PT goals. 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 Pt. will benefit from continued skilled PT services to increase R shoulder ROM/ strength to improve pain-free functional mobility.                         PT Long Term Goals - 08/12/21 1556       PT LONG TERM GOAL #1   Title Pt. I with HEP to increase R shoulder AROM to [facility] as compared to L shoulder to improve pain-free mobility.    Baseline R sh. AROM after manual tx.: flexion 138 deg./ abduction 120 deg./ ER 27 deg./ IR 65 deg.   9/14: Seated R sh. AROM pre-tx.: flexion 127 deg./ abduction 121 deg./ ER 24 deg./ IR 69 deg. R shoulder PROM: flexion 148 deg. R shoulder AROM post-tx.: flexion 133 deg./ abduction 124 deg./ ER 27 deg./ IR 70 deg.    Time 4    Period Weeks    Status Partially Met    Target Date 09/09/21      PT LONG TERM GOAL #2   Title Pt. will increase R deltoid/ biceps/ triceps strength to grossly 4/5 MMT to improve return to household tasks/ running.    Baseline No R shoulder MMT testing per MD protocol 7/13: R shoulder Flex 4-/5 abd 4-/5 ER 4-/5 IR 4-/5. 8/3: L/R shoulder flexion: 4/4-.  Abduction: 4/3+.  IR/ER: 4/3+.  8/24: no improvement in R sh. MMT noted as compared to last reassessment   9/14: R shoulder muscle weakness noted: flexion 4/5 MMT, abduction 4-/5 MMT (pain), biceps 4+/5 MMT, triceps 4/5 MMT    Time 4    Period Weeks    Status Partially Met    Target Date 09/09/21      PT LONG TERM GOAL #3   Title Pt. will increase FOTO to 60 to improve R shoulder mobility.    Baseline FOTO: baseline 29 7/13: 56; 8/8: 60, 08/12/21: 57    Time 4    Period Weeks    Status Partially Met    Target Date 09/09/21      PT LONG TERM GOAL #4   Title  Pt. able to complete all work related tasks with no R shoulder limitations or pain to promote return to PLOF.    Baseline Pt. has not returned to work at this time due to R shoulder limitations. 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 presents to PT with limited R shoulder AROM/ muscle weakness. Updated FOTO with patient which worsens slightly today to 57. Right shoulder range of motion and strength were last updated on 9/14 and at that time:  Seated R sh. AROM pre-tx.: flexion 127 deg./ abduction 121 deg./ ER 24 deg./ IR 69 deg.  R shoulder PROM: flexion 148 deg.  R shoulder AROM post-tx.: flexion 133 deg./ abduction 124 deg./ ER 27 deg./ IR 70 deg.  .  R shoulder muscle weakness noted: flexion 4/5 MMT, abduction 4-/5 MMT (pain), biceps 4+/5 MMT, triceps 4/5 MMT.  She is demonstrating improved R shoulder AROM with less UT compensatory mvmt./ L lateral lean during seated ex.  Pt. has shown slow but consistent progress towards PT goals. 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 Pt. will benefit from continued skilled PT services to increase R shoulder ROM/ strength to improve pain-free functional mobility.    Personal Factors and Comorbidities Fitness    Stability/Clinical Decision Making Stable/Uncomplicated    Rehab Potential Good    PT Frequency 3x / week    PT Duration 4 weeks    PT Treatment/Interventions ADLs/Self Care Home Management;Aquatic Therapy;Cryotherapy;Electrical Stimulation;Therapeutic exercise;Patient/family education;Neuromuscular re-education;Manual techniques;Scar mobilization;Passive range of  motion;Functional mobility training    PT Next Visit Plan Focus on R shoulder overhead reaching/ strengthening ex.  RECERT next tx. session.  Discuss POC and possible discharge with focus on gym based/HEP.    PT Home Exercise Plan See handouts    Consulted and Agree with Plan of Care Patient             Patient will benefit from skilled therapeutic intervention in order to improve the following deficits and impairments:  Pain, Decreased mobility, Decreased scar mobility, Hypomobility, Decreased strength, Decreased range of motion, Decreased endurance, Decreased activity tolerance, Impaired flexibility  Visit Diagnosis: Muscle weakness (generalized)  Shoulder joint stiffness, right     Problem List Patient Active Problem List   Diagnosis Date Noted   Generalized abdominal pain 06/19/2018   Nausea and vomiting 06/19/2018   Loss of weight 06/19/2018   Headache 12/18/2012   Hypothermia 12/18/2012   Phillips Grout PT, DPT, GCS  Anvita Hirata, PT 08/12/2021, 4:00 PM  [facility] [address] [place], [place], [address] Phone: [phone]   Fax:  [phone]  Name: Sara Gomez MRN: [id number] Date of Birth: [birth year]

## 2021-08-15 ENCOUNTER — Ambulatory Visit: Payer: 59 | Attending: Orthopedic Surgery | Admitting: Physical Therapy

## 2021-08-15 ENCOUNTER — Other Ambulatory Visit: Payer: Self-pay

## 2021-08-15 ENCOUNTER — Encounter: Payer: Self-pay | Admitting: Physical Therapy

## 2021-08-15 DIAGNOSIS — M6281 Muscle weakness (generalized): Secondary | ICD-10-CM | POA: Insufficient documentation

## 2021-08-15 DIAGNOSIS — M25511 Pain in right shoulder: Secondary | ICD-10-CM | POA: Insufficient documentation

## 2021-08-15 DIAGNOSIS — M25612 Stiffness of left shoulder, not elsewhere classified: Secondary | ICD-10-CM | POA: Diagnosis present

## 2021-08-15 DIAGNOSIS — M25512 Pain in left shoulder: Secondary | ICD-10-CM | POA: Diagnosis present

## 2021-08-15 DIAGNOSIS — M25611 Stiffness of right shoulder, not elsewhere classified: Secondary | ICD-10-CM | POA: Diagnosis present

## 2021-08-15 DIAGNOSIS — G8929 Other chronic pain: Secondary | ICD-10-CM | POA: Diagnosis present

## 2021-08-15 NOTE — Therapy (Signed)
[facility] [address]. [place], [place], [address] Phone: [phone]   Fax:  [phone]  Physical Therapy Treatment  Patient Details  Name: Sara Gomez MRN: [id number] Date of Birth: [birth year] Referring Provider (PT): Dr. Jeannie Fend   Encounter Date: 08/15/2021   PT End of Session - 08/15/21 0916     Visit Number 37    Number of Visits 74    Date for PT Re-Evaluation 09/09/21    Authorization Type BCBS COMM Pro (primary); UHC (secondary); visit limits based on necessity    Authorization - Visit Number 7    Authorization - Number of Visits 10    PT Start Time 0732    PT Stop Time 0820    PT Time Calculation (min) 48 min    Activity Tolerance Patient tolerated treatment well;Patient limited by pain    Behavior During Therapy [facility] for tasks assessed/performed             Past Medical History:  Diagnosis Date   Abdominal pain 05/23/2018   Nausea and vomiting 05/23/2018   Per [facility] Hepato w Marjean Donna order    Past Surgical History:  Procedure Laterality Date   COLONOSCOPY  06/07/2018   Dr Alice Reichert   SHOULDER ARTHROSCOPY     x2   SHOULDER LATERJET     x3   TONSILLECTOMY  2005   UPPER GI ENDOSCOPY  06/07/2018   Dr Alice Reichert   WISDOM TOOTH EXTRACTION      There were no vitals filed for this visit.   Subjective Assessment - 08/15/21 0906     Subjective Pt. reports having a headache this morning.  Pt. states she wasn't too active this past weekend.  Pt. has f/u with Dr. Jeannie Fend this month.    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 She has had 3 surgeries on the R shoulder and 4 on the L    Limitations Lifting;Standing;Writing;House hold activities    Patient Stated Goals Increase R shoulder ROM/ strength to improve pain-free mobility.    Currently in Pain? Yes    Pain Score 3     Pain Location  Shoulder    Pain Orientation Right    Pain Descriptors / Indicators Aching    Pain Type Chronic pain;Surgical pain              Ther.ex.:   Nautilus (all standing):  lat pull down 30# 20x.  B sh. Adduction 20# 20x.  Tricep extension 30#: 15x.      Supine B shoulder AROM (all planes)- pain tolerable range.  Rebounder: small blue ball for B shoulder ball toss.  R UE modified throwing motion (pt. Compensates with elbow (triceps/biceps)- limited shoulder flexion >90 deg.   Supine R serratus punches (blue ball)- 20x.  Supine 1# shoulder flexion (20)/ tricep extension (15x)/ horizontal abduction/ adduction 10x       Manual tx.:   Supine R shoulder AA/PROM 3x with slight overpressure to tolerable end-range.  Pain limited with shoulder ER.  STM to R posterior deltoid/ UT musculature.    Prone trigger point dry needling to R UT (2 needles)/ R rhomboid (1 needle)/ R posterior deltoid (1 needle)- multiple muscle fasciculations noted during UT needling with pistoning technique.  Good overall tx. Tolerance.    Prone B UT/upper back/ shoulder STM with use of Biofreeze.    PT Long Term Goals -  08/12/21 1556       PT LONG TERM GOAL #1   Title Pt. I with HEP to increase R shoulder AROM to [facility] as compared to L shoulder to improve pain-free mobility.    Baseline R sh. AROM after manual tx.: flexion 138 deg./ abduction 120 deg./ ER 27 deg./ IR 65 deg.   9/14: Seated R sh. AROM pre-tx.: flexion 127 deg./ abduction 121 deg./ ER 24 deg./ IR 69 deg. R shoulder PROM: flexion 148 deg. R shoulder AROM post-tx.: flexion 133 deg./ abduction 124 deg./ ER 27 deg./ IR 70 deg.    Time 4    Period Weeks    Status Partially Met    Target Date 09/09/21      PT LONG TERM GOAL #2   Title Pt. will increase R deltoid/ biceps/ triceps strength to grossly 4/5 MMT to improve return to household tasks/ running.    Baseline No R shoulder MMT testing per MD protocol 7/13: R shoulder Flex 4-/5 abd 4-/5 ER 4-/5 IR  4-/5. 8/3: L/R shoulder flexion: 4/4-.  Abduction: 4/3+.  IR/ER: 4/3+.  8/24: no improvement in R sh. MMT noted as compared to last reassessment   9/14: R shoulder muscle weakness noted: flexion 4/5 MMT, abduction 4-/5 MMT (pain), biceps 4+/5 MMT, triceps 4/5 MMT    Time 4    Period Weeks    Status Partially Met    Target Date 09/09/21      PT LONG TERM GOAL #3   Title Pt. will increase FOTO to 60 to improve R shoulder mobility.    Baseline FOTO: baseline 29 7/13: 56; 8/8: 60, 08/12/21: 57    Time 4    Period Weeks    Status Partially Met    Target Date 09/09/21      PT LONG TERM GOAL #4   Title Pt. able to complete all work related tasks with no R shoulder limitations or pain to promote return to PLOF.    Baseline Pt. has not returned to work at this time due to R shoulder limitations. 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 focused on R sh./scapular strengthening with use of Nautilus in standing and 1# dumbbells in supine.  (+) R mid-deltoid/ supraspinatus tenderness with palpation during R shoulder AA/PROM (all planes).  Pt. limited with sets/reps at Nautilus secondary to pain and muscle fatigue.  Pt. instructed to complete ther.ex. in a pain tolerable range with or without resistance as tolerated.  Several muscle fasciculations with trigger point dry needling to R UT/ rhomboid musculature.  Pt. continues to report neck muscle tightness/ discomfort after tx.  PT discussed benefits of a consistent gym based ex. program to encourage generalized strengthening and overall conditioning.  Pt. will continue to benefit from skilled PT services to increase R shoulder  stability/ pain-free mobility.    Personal Factors and Comorbidities Fitness    Stability/Clinical Decision Making Stable/Uncomplicated    Clinical Decision Making Low    Rehab Potential Good    PT Frequency 3x / week  PT Duration 4 weeks    PT Treatment/Interventions ADLs/Self Care Home Management;Aquatic Therapy;Cryotherapy;Electrical Stimulation;Therapeutic exercise;Patient/family education;Neuromuscular re-education;Manual techniques;Scar mobilization;Passive range of motion;Functional mobility training    PT Next Visit Plan Focus on R shoulder overhead reaching/ strengthening ex.    PT Home Exercise Plan See handouts    Consulted and Agree with Plan of Care Patient             Patient will benefit from skilled therapeutic intervention in order to improve the following deficits and impairments:  Pain, Decreased mobility, Decreased scar mobility, Hypomobility, Decreased strength, Decreased range of motion, Decreased endurance, Decreased activity tolerance, Impaired flexibility  Visit Diagnosis: Muscle weakness (generalized)  Shoulder joint stiffness, right  Acute pain of right shoulder     Problem List Patient Active Problem List   Diagnosis Date Noted   Generalized abdominal pain 06/19/2018   Nausea and vomiting 06/19/2018   Loss of weight 06/19/2018   Headache 12/18/2012   Hypothermia 12/18/2012   Pura Spice, PT, DPT # [phone] 08/15/2021, 10:04 AM  [facility] [address] [place], [place], [address] Phone: [phone]   Fax:  [phone]  Name: Eladia Frame Papadakis MRN: [id number] Date of Birth: [birth year]

## 2021-08-17 ENCOUNTER — Ambulatory Visit: Payer: 59 | Admitting: Physical Therapy

## 2021-08-17 ENCOUNTER — Other Ambulatory Visit: Payer: Self-pay

## 2021-08-17 DIAGNOSIS — M6281 Muscle weakness (generalized): Secondary | ICD-10-CM | POA: Diagnosis not present

## 2021-08-17 DIAGNOSIS — M25511 Pain in right shoulder: Secondary | ICD-10-CM

## 2021-08-17 DIAGNOSIS — M25611 Stiffness of right shoulder, not elsewhere classified: Secondary | ICD-10-CM

## 2021-08-18 ENCOUNTER — Encounter: Payer: Self-pay | Admitting: Physical Therapy

## 2021-08-18 NOTE — Therapy (Signed)
[facility] [address]. [place], [place], [address] Phone: [phone]   Fax:  [phone]  Physical Therapy Treatment  Patient Details  Name: Sara Gomez MRN: [id number] Date of Birth: Jan 01, 1996 Referring Provider (PT): Dr. Jeannie Fend   Encounter Date: 08/17/2021   PT End of Session - 08/18/21 0753     Visit Number 44    Number of Visits 11    Date for PT Re-Evaluation 09/09/21    Authorization Type BCBS COMM Pro (primary); UHC (secondary); visit limits based on necessity    Authorization - Visit Number 8    Authorization - Number of Visits 10    PT Start Time 0731    PT Stop Time 0822    PT Time Calculation (min) 51 min    Activity Tolerance Patient tolerated treatment well;Patient limited by pain    Behavior During Therapy [facility] for tasks assessed/performed             Past Medical History:  Diagnosis Date   Abdominal pain 05/23/2018   Nausea and vomiting 05/23/2018   Per NM Hepato w Marjean Donna order    Past Surgical History:  Procedure Laterality Date   COLONOSCOPY  06/07/2018   Dr Alice Reichert   SHOULDER ARTHROSCOPY     x2   SHOULDER LATERJET     x3   TONSILLECTOMY  2005   UPPER GI ENDOSCOPY  06/07/2018   Dr Alice Reichert   WISDOM TOOTH EXTRACTION      There were no vitals filed for this visit.   Subjective Assessment - 08/18/21 0738     Subjective Pt. entered PT [birth year] birthday and states she didn't sleep well last night secondary to curlers in hair.  Pt. reports tightness/ discomfort in neck with rotation/ lateral flexion in seated and supine position.  Pt. did not like with sensation of Biofreeze after last tx. session.    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 She has had 3 surgeries on the R shoulder and 4 on the L    Limitations Lifting;Standing;Writing;House hold activities    Patient Stated  Goals Increase R shoulder ROM/ strength to improve pain-free mobility.    Currently in Pain? Yes    Pain Score 3     Pain Location Shoulder    Pain Orientation Right    Pain Score 4    Pain Location Neck    Pain Orientation Right;Left    Pain Descriptors / Indicators Aching    Pain Type Chronic pain               Ther.ex.:   No Nautilus ex. Today.    Seated/supine B shoulder AROM (all planes)- pain tolerable range.  Seated cervical AROM (all planes)- 5x.    Supine 2# ex.: R serratus punches - 20x.  Supine 2# shoulder flexion (20x)/ tricep extension (20x)/ horizontal abduction/ adduction 10x2 (no wt.).  Grip strength reassessment: R 68.5#/ L 62.3#        Manual tx.:   Supine R shoulder AA/PROM 7x with slight overpressure to tolerable end-range.  Pain limited with shoulder ER.  STM to R posterior deltoid/ UT musculature.    Prone B UT/upper back/ shoulder STM and grade III PA mobs (unilateral)- T3-8 on R /L       PT Long Term Goals - 08/12/21 1556       PT LONG TERM GOAL #  1   Title Pt. I with HEP to increase R shoulder AROM to [facility] as compared to L shoulder to improve pain-free mobility.    Baseline R sh. AROM after manual tx.: flexion 138 deg./ abduction 120 deg./ ER 27 deg./ IR 65 deg.   9/14: Seated R sh. AROM pre-tx.: flexion 127 deg./ abduction 121 deg./ ER 24 deg./ IR 69 deg. R shoulder PROM: flexion 148 deg. R shoulder AROM post-tx.: flexion 133 deg./ abduction 124 deg./ ER 27 deg./ IR 70 deg.    Time 4    Period Weeks    Status Partially Met    Target Date 09/09/21      PT LONG TERM GOAL #2   Title Pt. will increase R deltoid/ biceps/ triceps strength to grossly 4/5 MMT to improve return to household tasks/ running.    Baseline No R shoulder MMT testing per MD protocol 7/13: R shoulder Flex 4-/5 abd 4-/5 ER 4-/5 IR 4-/5. 8/3: L/R shoulder flexion: 4/4-.  Abduction: 4/3+.  IR/ER: 4/3+.  8/24: no improvement in R sh. MMT noted as compared to last reassessment    9/14: R shoulder muscle weakness noted: flexion 4/5 MMT, abduction 4-/5 MMT (pain), biceps 4+/5 MMT, triceps 4/5 MMT    Time 4    Period Weeks    Status Partially Met    Target Date 09/09/21      PT LONG TERM GOAL #3   Title Pt. will increase FOTO to 60 to improve R shoulder mobility.    Baseline FOTO: baseline 29 7/13: 56; 8/8: 60, 08/12/21: 57    Time 4    Period Weeks    Status Partially Met    Target Date 09/09/21      PT LONG TERM GOAL #4   Title Pt. able to complete all work related tasks with no R shoulder limitations or pain to promote return to PLOF.    Baseline Pt. has not returned to work at this time due to R shoulder limitations. 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  Several trigger points noted and STM performed with statich stretches to decrease symptoms/ muscle tightness.  All R shoulder resisted ther.ex. performed in supine position and pt. progressed to 2# dumbbell with triceps/ shoulder flexion.  Pt. unable to complete R shoulder horizontal abduction/ adduction with resistance due to pain.  Pt. instructed in cervical/thoracic stretches for home and PT discussed the importance of good body mechanics/ posture/ sleeping position.    Personal Factors and Comorbidities Fitness    Stability/Clinical Decision Making Stable/Uncomplicated    Clinical Decision Making Low    Rehab Potential Good    PT Frequency 3x / week    PT Duration 4 weeks    PT Treatment/Interventions ADLs/Self Care Home Management;Aquatic Therapy;Cryotherapy;Electrical  Stimulation;Therapeutic exercise;Patient/family education;Neuromuscular re-education;Manual techniques;Scar mobilization;Passive range of motion;Functional mobility training    PT Next Visit Plan Focus on R shoulder overhead reaching/ strengthening ex.  Reassess R mid-thoracic/scapular pain  PT Home Exercise Plan See handouts    Consulted and Agree with Plan of Care Patient             Patient will benefit from skilled therapeutic intervention in order to improve the following deficits and impairments:  Pain, Decreased mobility, Decreased scar mobility, Hypomobility, Decreased strength, Decreased range of motion, Decreased endurance, Decreased activity tolerance, Impaired flexibility  Visit Diagnosis: Muscle weakness (generalized)  Shoulder joint stiffness, right  Acute pain of right shoulder     Problem List Patient Active Problem List   Diagnosis Date Noted   Generalized abdominal pain 06/19/2018   Nausea and vomiting 06/19/2018   Loss of weight 06/19/2018   Headache 12/18/2012   Hypothermia 12/18/2012   Pura Spice, PT, DPT # [phone] 08/18/2021, 7:58 AM  [place] [facility] [address] [place], [place], [address] Phone: [phone]   Fax:  [phone]  Name: Sara Gomez MRN: [id number] Date of Birth: [birth year]

## 2021-08-22 ENCOUNTER — Ambulatory Visit: Payer: 59

## 2021-08-22 ENCOUNTER — Other Ambulatory Visit: Payer: Self-pay

## 2021-08-22 DIAGNOSIS — M25611 Stiffness of right shoulder, not elsewhere classified: Secondary | ICD-10-CM

## 2021-08-22 DIAGNOSIS — M6281 Muscle weakness (generalized): Secondary | ICD-10-CM

## 2021-08-22 NOTE — Therapy (Signed)
[place] [facility] [address] [place], [place], [address] Phone: [phone]   Fax:  [phone]  Physical Therapy Treatment  Patient Details  Name: Sara Gomez MRN: [id number] Date of Birth: 1996-09-09 Referring Provider (PT): Dr. Jeannie Fend   Encounter Date: 08/22/2021   PT End of Session - 08/22/21 1536     Visit Number 92    Number of Visits 3    Date for PT Re-Evaluation 09/09/21    Authorization Type BCBS COMM Pro (primary); UHC (secondary); visit limits based on necessity    Authorization - Visit Number 9    Authorization - Number of Visits 10    PT Start Time 1532    PT Stop Time 1615    PT Time Calculation (min) 43 min    Activity Tolerance Patient tolerated treatment well;Patient limited by pain    Behavior During Therapy [facility] for tasks assessed/performed             Past Medical History:  Diagnosis Date   Abdominal pain 05/23/2018   Nausea and vomiting 05/23/2018   Per NM Hepato w Marjean Donna order    Past Surgical History:  Procedure Laterality Date   COLONOSCOPY  06/07/2018   Dr Alice Reichert   SHOULDER ARTHROSCOPY     x2   SHOULDER LATERJET     x3   TONSILLECTOMY  2005   UPPER GI ENDOSCOPY  06/07/2018   Dr Alice Reichert   WISDOM TOOTH EXTRACTION      There were no vitals filed for this visit.   Subjective Assessment - 08/22/21 1534     Subjective Pt. reports she is doing alright today. She reports continued soreness in R upper/mid trap and rhomboid region of her back. No resting pain and no specific questions/concerns.    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 She has had 3 surgeries on the R shoulder and 4 on the L    Limitations Lifting;Standing;Writing;House hold activities    Patient Stated Goals Increase R shoulder ROM/ strength to improve pain-free mobility.                 TREATMENT     Ther-ex Nautilus: Seated lat pull down 60# 2 x 10; Seated rows 40# 2 x 10;  Standing tricep extension 40# 2 x 10; Standing chest press 30# 2 x 10;   Manual Supine R shoulder PROM into flexion and scaption within pain-free tolerance and never pushing through resistance; Gentle R shoulder distraction for pain modulation while moving through passive abduction; R shoulder AP grade I-II, mobilizations with shoulder at neutral, 20s/bout x 2 bouts; R shoulder superior to inferior grade I-II mobilizations with shoulder at 90 abduction to improve abduction ROM, 20s/bout x 2 bouts; R posterior deltoid/infraspinatus/tricep/rhomboids/upper traps STM in left side-lying position;   Pt educated throughout session about proper posture and technique with exercises. Improved exercise technique, movement at target joints, use of target muscles after min to mod verbal, visual, tactile cues.   Pt. presents to PT with limited R shoulder AROM/ muscle weakness.  Progress resistance on Nautilus machine today for strengthening with longer rest breaks between sets for recovery.  She reports persistent right upper trap/mid trap/rhomboid soreness so continued with soft tissue mobilization.  Pt. will benefit from continued skilled PT services to increase R shoulder ROM/ strength to improve pain-free functional mobility.  PT Long Term Goals - 08/12/21 1556       PT LONG TERM GOAL #1   Title Pt. I with HEP to increase R shoulder AROM to [facility] as compared to L shoulder to improve pain-free mobility.    Baseline R sh. AROM after manual tx.: flexion 138 deg./ abduction 120 deg./ ER 27 deg./ IR 65 deg.   9/14: Seated R sh. AROM pre-tx.: flexion 127 deg./ abduction 121 deg./ ER 24 deg./ IR 69 deg. R shoulder PROM: flexion 148 deg. R shoulder AROM post-tx.: flexion 133 deg./ abduction 124 deg./ ER 27 deg./ IR 70 deg.    Time 4    Period Weeks    Status  Partially Met    Target Date 09/09/21      PT LONG TERM GOAL #2   Title Pt. will increase R deltoid/ biceps/ triceps strength to grossly 4/5 MMT to improve return to household tasks/ running.    Baseline No R shoulder MMT testing per MD protocol 7/13: R shoulder Flex 4-/5 abd 4-/5 ER 4-/5 IR 4-/5. 8/3: L/R shoulder flexion: 4/4-.  Abduction: 4/3+.  IR/ER: 4/3+.  8/24: no improvement in R sh. MMT noted as compared to last reassessment   9/14: R shoulder muscle weakness noted: flexion 4/5 MMT, abduction 4-/5 MMT (pain), biceps 4+/5 MMT, triceps 4/5 MMT    Time 4    Period Weeks    Status Partially Met    Target Date 09/09/21      PT LONG TERM GOAL #3   Title Pt. will increase FOTO to 60 to improve R shoulder mobility.    Baseline FOTO: baseline 29 7/13: 56; 8/8: 60, 08/12/21: 57    Time 4    Period Weeks    Status Partially Met    Target Date 09/09/21      PT LONG TERM GOAL #4   Title Pt. able to complete all work related tasks with no R shoulder limitations or pain to promote return to PLOF.    Baseline Pt. has not returned to work at this time due to R shoulder limitations. 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 presents to PT with limited R shoulder AROM/ muscle weakness.  Progress resistance on Nautilus machine today for strengthening with longer rest breaks between sets for recovery.  She reports persistent right upper trap/mid trap/rhomboid soreness so continued with soft tissue mobilization.  Pt. will benefit from continued skilled PT services to increase R shoulder ROM/ strength to improve pain-free functional mobility.    Personal Factors and  Comorbidities Fitness    Stability/Clinical Decision Making Stable/Uncomplicated    Rehab Potential Good    PT Frequency 3x / week    PT Duration 4 weeks    PT Treatment/Interventions ADLs/Self Care Home Management;Aquatic Therapy;Cryotherapy;Electrical Stimulation;Therapeutic exercise;Patient/family education;Neuromuscular re-education;Manual techniques;Scar mobilization;Passive range of motion;Functional mobility training    PT Next Visit Plan Progress note; Focus on R shoulder overhead reaching/ strengthening ex.  Reassess R mid-thoracic/scapular pain    PT Home Exercise Plan See handouts  Consulted and Agree with Plan of Care Patient             Patient will benefit from skilled therapeutic intervention in order to improve the following deficits and impairments:  Pain, Decreased mobility, Decreased scar mobility, Hypomobility, Decreased strength, Decreased range of motion, Decreased endurance, Decreased activity tolerance, Impaired flexibility  Visit Diagnosis: Muscle weakness (generalized)  Shoulder joint stiffness, right     Problem List Patient Active Problem List   Diagnosis Date Noted   Generalized abdominal pain 06/19/2018   Nausea and vomiting 06/19/2018   Loss of weight 06/19/2018   Headache 12/18/2012   Hypothermia 12/18/2012   Phillips Grout PT, DPT, GCS  Syniyah Bourne, PT 08/23/2021, 11:32 AM  [place] [facility] [address] [place], [place], [address] Phone: [phone]   Fax:  [phone]  Name: Azra Abrell Blackshire MRN: [id number] Date of Birth: July 08, 1996

## 2021-08-24 ENCOUNTER — Encounter: Payer: Self-pay | Admitting: Physical Therapy

## 2021-08-24 ENCOUNTER — Ambulatory Visit: Payer: 59 | Admitting: Physical Therapy

## 2021-08-24 ENCOUNTER — Other Ambulatory Visit: Payer: Self-pay

## 2021-08-24 DIAGNOSIS — M25511 Pain in right shoulder: Secondary | ICD-10-CM

## 2021-08-24 DIAGNOSIS — M6281 Muscle weakness (generalized): Secondary | ICD-10-CM | POA: Diagnosis not present

## 2021-08-24 DIAGNOSIS — M25611 Stiffness of right shoulder, not elsewhere classified: Secondary | ICD-10-CM

## 2021-08-24 DIAGNOSIS — G8929 Other chronic pain: Secondary | ICD-10-CM

## 2021-08-24 DIAGNOSIS — M25612 Stiffness of left shoulder, not elsewhere classified: Secondary | ICD-10-CM

## 2021-08-24 NOTE — Therapy (Signed)
[facility] [address] [place], [place], [address] Phone: [phone]   Fax:  [phone]  Physical Therapy Treatment and Progress note. 07/29/2021-08/24/2021  Patient Details  Name: Sara Gomez MRN: [id number] Date of Birth: [birth year] Referring Provider (PT): Dr. Jeannie Fend   Encounter Date: 08/24/2021   PT End of Session - 08/24/21 1038     Visit Number 40    Number of Visits 31    Date for PT Re-Evaluation 09/09/21    Authorization Type BCBS COMM Pro (primary); UHC (secondary); visit limits based on necessity    Authorization - Visit Number 10    Authorization - Number of Visits 10    PT Start Time 0732    PT Stop Time 0816    PT Time Calculation (min) 44 min    Activity Tolerance Patient tolerated treatment well;Patient limited by pain    Behavior During Therapy [facility] for tasks assessed/performed             Past Medical History:  Diagnosis Date   Abdominal pain 05/23/2018   Nausea and vomiting 05/23/2018   Per [place] Hepato w Marjean Donna order    Past Surgical History:  Procedure Laterality Date   COLONOSCOPY  06/07/2018   Dr Alice Reichert   SHOULDER ARTHROSCOPY     x2   SHOULDER LATERJET     x3   TONSILLECTOMY  2005   UPPER GI ENDOSCOPY  06/07/2018   Dr Alice Reichert   WISDOM TOOTH EXTRACTION      There were no vitals filed for this visit.   Subjective Assessment - 08/24/21 1045     Subjective Pt states increase pain in the R shoulder pain with focal tenderness in the posterior rotator cuff. Pt states poor sleep adherence last night 2/2 to R shoulder pain.  MD f/u rescheduled to 09/14/21 (3 weeks)    Pertinent History Status post R shoulder revision bone block procedure (replacement of old coracoid transfer with distal tibial allograft) on 04/07/2021. She has had 3 surgeries on the R shoulder and 4 on the L    Limitations Lifting;Standing;Writing;House hold activities    Patient Stated Goals Increase R shoulder ROM/  strength to improve pain-free mobility.    Currently in Pain? Yes    Pain Score 4     Pain Location Shoulder    Pain Orientation Right    Pain Descriptors / Indicators Aching    Pain Type Chronic pain;Surgical pain    Pain Onset 1 to 4 weeks ago    Pain Frequency Intermittent               Reassessment and treatment.    ROM and MMT: ISQ from previous assessment 08/12/2021  FOTO: 69. Goal met on 40th visit   Completed work related task: Currently not working.   Pt. Has job offer to work at [employer] but states she not interested in going.      TREATMENT     Ther-ex  Nautilus: Seated lat pull down 30# 2 x 10; Standing rows 30# 2 x 10; poor tolerance and displayed poor mechanics 2/2 to R shoulder fatigue.   Standing tricep extension 30# 2 x 10; Standing chest press 30# 2 x 10;   Manual  Supine R shoulder PROM into flexion and scaption within pain-free tolerance and never pushing through resistance 10x.  R shoulder D1/D2 PROM (as tolerated)- 10x each.    Gentle R shoulder distraction for pain modulation while moving through passive  abduction.  R posterior deltoid/infraspinatus/tricep/rhomboids/upper traps STM in left side-lying position.   Trigger Point Dry Needling (TDN) Education performed with patient regarding potential benefit of TDN. Reviewed precautions and risks with patient. Reviewed special precautions/risks over lung fields which include pneumothorax. Reviewed signs and symptoms of pneumothorax and advised pt to go to ER immediately if these symptoms develop advise them of dry needling treatment. Pt provided verbal consent to treatment. TDN performed to R UT/ posterior deltoid trigger points, 0.25 x 60 L-type single needle placements on each side with 1 local twitch response (LTR). Pistoning technique utilized.       PT Long Term Goals - 08/24/21 0732       PT LONG TERM GOAL #1   Title Pt. I with HEP to increase R shoulder AROM to [facility] as compared to L  shoulder to improve pain-free mobility.    Baseline R sh. AROM after manual tx.: flexion 138 deg./ abduction 120 deg./ ER 27 deg./ IR 65 deg.   9/14: Seated R sh. AROM pre-tx.: flexion 127 deg./ abduction 121 deg./ ER 24 deg./ IR 69 deg. R shoulder PROM: flexion 148 deg. R shoulder AROM post-tx.: flexion 133 deg./ abduction 124 deg./ ER 27 deg./ IR 70 deg. 10/12: ROM ISQ from previous tx.    Time 4    Period Weeks    Status Partially Met    Target Date 09/09/21      PT LONG TERM GOAL #2   Title Pt. will increase R deltoid/ biceps/ triceps strength to grossly 4/5 MMT to improve return to household tasks/ running.    Baseline No R shoulder MMT testing per MD protocol 7/13: R shoulder Flex 4-/5 abd 4-/5 ER 4-/5 IR 4-/5. 8/3: L/R shoulder flexion: 4/4-.  Abduction: 4/3+.  IR/ER: 4/3+.  8/24: no improvement in R sh. MMT noted as compared to last reassessment   9/14: R shoulder muscle weakness noted: flexion 4/5 MMT, abduction 4-/5 MMT (pain), biceps 4+/5 MMT, triceps 4/5 MMT  10/12: R shoulder muscle weakness noted: flexion 4/5 MMT, abduction 4-/5 MMT (pain), biceps 4+/5 MMT, triceps 4/5 MMT *ISQ from last assessment*.    Time 4    Period Weeks    Status Partially Met    Target Date 09/09/21      PT LONG TERM GOAL #3   Title Pt. will increase FOTO to 60 to improve R shoulder mobility.    Baseline FOTO: baseline 29 7/13: 56; 8/8: 60, 08/12/21: 57 10/12: 69 (achieved on the 40th visit)    Time 4    Period Weeks    Status Achieved    Target Date 08/24/21      PT LONG TERM GOAL #4   Title Pt. able to complete all work related tasks with no R shoulder limitations or pain to promote return to PLOF.    Baseline Pt. has not returned to work at this time due to R shoulder limitations. 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    Time 4    Period Weeks    Status Partially Met    Target Date 09/09/21                 Plan - 08/24/21 1039     Clinical Impression Statement Pt was reassessed on this date 08/24/2021. Measurements displayed  ISQ compared to previous assessment 08/12/2021, besides FOTO score of 69 which resulted in goal achieved on the 40th visit. Pt continues to display mark weakness in the R shoulder and parascapular muscles. Pt states poor adherence to HEP. Pt continues be benefit from [facility] and dry needling to R post rotator cuff. Pt continues to display mark RUE endurance, strength, and ROM deficits resulting in decreased safe home/community mobiity, increased pain, and limited access to QOL. Pt. will continue to benefit from skilled physical therapy to progress POC to address remaining deficits to facilitate maximum functional capacity for optimal personal health and wellness for ADLs.    Personal Factors and Comorbidities Fitness    Stability/Clinical Decision Making Stable/Uncomplicated    Clinical Decision Making Low    Rehab Potential Good    PT Frequency 3x / week    PT Duration 4 weeks    PT Treatment/Interventions ADLs/Self Care Home Management;Aquatic Therapy;Cryotherapy;Electrical Stimulation;Therapeutic exercise;Patient/family education;Neuromuscular re-education;Manual techniques;Scar mobilization;Passive range of motion;Functional mobility training    PT Next Visit Plan Progress note; Focus on R shoulder overhead reaching/ strengthening ex.  Reassess R mid-thoracic/scapular pain    PT Home Exercise Plan See handouts    Consulted and Agree with Plan of Care Patient             Patient will benefit from skilled therapeutic intervention in order to improve the following deficits and impairments:  Pain, Decreased mobility, Decreased scar mobility, Hypomobility, Decreased strength, Decreased range of motion, Decreased endurance, Decreased activity tolerance,  Impaired flexibility  Visit Diagnosis: Muscle weakness (generalized)  Shoulder joint stiffness, right  Acute pain of right shoulder     Problem List Patient Active Problem List   Diagnosis Date Noted   Generalized abdominal pain 06/19/2018   Nausea and vomiting 06/19/2018   Loss of weight 06/19/2018   Headache 12/18/2012   Hypothermia 12/18/2012   Pura Spice, PT, DPT # [id number] Fara Olden, SPT 08/25/2021, 7:59 AM  [facility] [address] [place], [place], [address] Phone: [phone]   Fax:  [phone]  Name: Sara Gomez MRN: [id number] Date of Birth: [birth year]

## 2021-08-26 ENCOUNTER — Ambulatory Visit: Payer: 59

## 2021-08-29 ENCOUNTER — Other Ambulatory Visit: Payer: Self-pay

## 2021-08-29 ENCOUNTER — Ambulatory Visit: Payer: 59 | Admitting: Physical Therapy

## 2021-08-29 DIAGNOSIS — M25611 Stiffness of right shoulder, not elsewhere classified: Secondary | ICD-10-CM

## 2021-08-29 DIAGNOSIS — M25511 Pain in right shoulder: Secondary | ICD-10-CM

## 2021-08-29 DIAGNOSIS — M6281 Muscle weakness (generalized): Secondary | ICD-10-CM | POA: Diagnosis not present

## 2021-08-29 DIAGNOSIS — M25612 Stiffness of left shoulder, not elsewhere classified: Secondary | ICD-10-CM

## 2021-08-31 ENCOUNTER — Encounter: Payer: 59 | Admitting: Physical Therapy

## 2021-09-01 ENCOUNTER — Encounter: Payer: Self-pay | Admitting: Physical Therapy

## 2021-09-01 NOTE — Therapy (Signed)
[facility] [address] [place], [place], [address] Phone: [phone]   Fax:  [phone]  Physical Therapy Treatment  Patient Details  Name: Sara Gomez MRN: [id number] Date of Birth: 1996-01-04 Referring Provider (PT): Dr. Jeannie Fend   Encounter Date: 08/29/2021   PT End of Session - 09/01/21 1913     Visit Number 41    Number of Visits 52    Date for PT Re-Evaluation 09/09/21    Authorization Type BCBS COMM Pro (primary); UHC (secondary); visit limits based on necessity    Authorization - Visit Number 1    Authorization - Number of Visits 10    PT Start Time [id number]    PT Stop Time 0820    PT Time Calculation (min) 49 min    Activity Tolerance Patient tolerated treatment well;Patient limited by pain    Behavior During Therapy [facility] for tasks assessed/performed             Past Medical History:  Diagnosis Date   Abdominal pain 05/23/2018   Nausea and vomiting 05/23/2018   Per [place] Hepato w Marjean Donna order    Past Surgical History:  Procedure Laterality Date   COLONOSCOPY  06/07/2018   Dr Alice Reichert   SHOULDER ARTHROSCOPY     x2   SHOULDER LATERJET     x3   TONSILLECTOMY  2005   UPPER GI ENDOSCOPY  06/07/2018   Dr Alice Reichert   WISDOM TOOTH EXTRACTION      There were no vitals filed for this visit.   Subjective Assessment - 09/01/21 1911     Subjective Pt. entered PT with c/o neck/ R shoulder discomfort.  Pt. started job at [employer] last week.  Pt. reports being tired this morning but ready for PT.    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 She has had 3 surgeries on the R shoulder and 4 on the L    Limitations Lifting;Standing;Writing;House hold activities    Patient Stated Goals Increase R shoulder ROM/ strength to improve pain-free mobility.    Currently in Pain? Yes    Pain Score 3      Pain Location Shoulder    Pain Orientation Right    Pain Descriptors / Indicators Aching;Sore;Discomfort    Pain Onset More than a month ago    Pain Frequency Intermittent              TREATMENT     Ther-ex   Nautilus: Standing lat pull down 30# 12 x 2; Standing shoulder IR/ ER 10# 12 x 2; B shoulder adduction 20# 20x; Standing rows 30# 2 x 12; poor tolerance and displayed poor mechanics 2/2 to R shoulder fatigue.    Supine 2# chest press/ tricep extension/ serratus punches/ bicep curls 20x each.    Supine R shoulder AROM (flexion/ abduction/ ER)- 10x     Manual   R posterior deltoid/infraspinatus/tricep/rhomboids/upper traps STM in left side-lying position.  Supine L/R UT and levator stretches 3x each with static holds.  Supine cervical traction (gentle)- static holds 3x/ STM to upper cervical paraspinals and occiput region.   No TDN today.        PT Long Term Goals - 08/24/21 0732       PT LONG TERM GOAL #1   Title Pt. I with HEP to increase R shoulder AROM to [facility] as compared to L shoulder to  improve pain-free mobility.    Baseline R sh. AROM after manual tx.: flexion 138 deg./ abduction 120 deg./ ER 27 deg./ IR 65 deg.   9/14: Seated R sh. AROM pre-tx.: flexion 127 deg./ abduction 121 deg./ ER 24 deg./ IR 69 deg. R shoulder PROM: flexion 148 deg. R shoulder AROM post-tx.: flexion 133 deg./ abduction 124 deg./ ER 27 deg./ IR 70 deg. 10/12: ROM ISQ from previous tx.    Time 4    Period Weeks    Status Partially Met    Target Date 09/09/21      PT LONG TERM GOAL #2   Title Pt. will increase R deltoid/ biceps/ triceps strength to grossly 4/5 MMT to improve return to household tasks/ running.    Baseline No R shoulder MMT testing per MD protocol 7/13: R shoulder Flex 4-/5 abd 4-/5 ER 4-/5 IR 4-/5. 8/3: L/R shoulder flexion: 4/4-.  Abduction: 4/3+.  IR/ER: 4/3+.  8/24: no improvement in R sh. MMT noted as compared to last reassessment   9/14: R shoulder muscle weakness  noted: flexion 4/5 MMT, abduction 4-/5 MMT (pain), biceps 4+/5 MMT, triceps 4/5 MMT  10/12: R shoulder muscle weakness noted: flexion 4/5 MMT, abduction 4-/5 MMT (pain), biceps 4+/5 MMT, triceps 4/5 MMT *ISQ from last assessment*.    Time 4    Period Weeks    Status Partially Met    Target Date 09/09/21      PT LONG TERM GOAL #3   Title Pt. will increase FOTO to 60 to improve R shoulder mobility.    Baseline FOTO: baseline 29 7/13: 56; 8/8: 60, 08/12/21: 57 10/12: 69 (achieved on the 40th visit)    Time 4    Period Weeks    Status Achieved    Target Date 08/24/21      PT LONG TERM GOAL #4   Title Pt. able to complete all work related tasks with no R shoulder limitations or pain to promote return to PLOF.    Baseline Pt. has not returned to work at this time due to R shoulder limitations. 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    Time 4    Period Weeks    Status Partially Met    Target Date 09/09/21                   Plan - 09/01/21 1914     Clinical Impression Statement R shoulder AROM presents WFL but pain limited at end-range flexion/ abduction.  Slow but consistent progress with strength training/ resisted ther.ex. in standing and supine positions. 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  Pt. able to manage/ carry 2 plates and mix cocktails.  Pt. unable to lift a keg/ heavy trays at work due to UE muscle weakness.  Pt. c/o B UT/ upper cervical paraspinal muscle tightness and pain at rest.  No change in neck pain with gentle stretches/ traction or STM.  No trigger point dry needling today and tx. focused on shoulder stability/ posture correction.  Pt. will continue to benefit from skilled PT to progress UE strengthening/ pain mgmt. to improve  return to prior level of function without limitations.    Personal Factors and Comorbidities Fitness    Stability/Clinical Decision Making Stable/Uncomplicated    Clinical Decision  Making Low    Rehab Potential Good    PT Frequency 3x / week    PT Duration 4 weeks    PT Treatment/Interventions ADLs/Self Care Home Management;Aquatic Therapy;Cryotherapy;Electrical Stimulation;Therapeutic exercise;Patient/family education;Neuromuscular re-education;Manual techniques;Scar mobilization;Passive range of motion;Functional mobility training    PT Next Visit Plan Focus on R shoulder overhead reaching/ strengthening ex.  Reassess R mid-thoracic/scapular pain    PT Home Exercise Plan See handouts    Consulted and Agree with Plan of Care Patient             Patient will benefit from skilled therapeutic intervention in order to improve the following deficits and impairments:  Pain, Decreased mobility, Decreased scar mobility, Hypomobility, Decreased strength, Decreased range of motion, Decreased endurance, Decreased activity tolerance, Impaired flexibility  Visit Diagnosis: Muscle weakness (generalized)  Shoulder joint stiffness, right  Acute pain of right shoulder  Shoulder joint stiffness, left     Problem List Patient Active Problem List   Diagnosis Date Noted   Generalized abdominal pain 06/19/2018   Nausea and vomiting 06/19/2018   Loss of weight 06/19/2018   Headache 12/18/2012   Hypothermia 12/18/2012   Pura Spice, PT, DPT # [id number] 09/01/2021, 7:33 PM  [facility] [address]. [place], [place], [address] Phone: [phone]   Fax:  [phone]  Name: Sara Gomez MRN: [id number] Date of Birth: [birth year]

## 2021-09-02 ENCOUNTER — Ambulatory Visit: Payer: 59

## 2021-09-05 ENCOUNTER — Other Ambulatory Visit: Payer: Self-pay

## 2021-09-05 ENCOUNTER — Ambulatory Visit: Payer: 59

## 2021-09-05 DIAGNOSIS — M6281 Muscle weakness (generalized): Secondary | ICD-10-CM

## 2021-09-05 DIAGNOSIS — M25611 Stiffness of right shoulder, not elsewhere classified: Secondary | ICD-10-CM

## 2021-09-05 NOTE — Therapy (Signed)
[facility] [address] [place], [place], [address] Phone: [phone]   Fax:  [phone]  Physical Therapy Treatment  Patient Details  Name: Sara Gomez Oh MRN: [id number] Date of Birth: [birth year] Referring Provider (PT): Dr. Jeannie Fend   Encounter Date: 09/05/2021   PT End of Session - 09/06/21 1637     Visit Number 42    Number of Visits 52    Date for PT Re-Evaluation 09/09/21    Authorization Type BCBS COMM Pro (primary); UHC (secondary); visit limits based on necessity    Authorization - Visit Number 2    Authorization - Number of Visits 10    PT Start Time 1400    PT Stop Time 1445    PT Time Calculation (min) 45 min    Activity Tolerance Patient tolerated treatment well;Patient limited by pain    Behavior During Therapy [facility] for tasks assessed/performed              Past Medical History:  Diagnosis Date   Abdominal pain 05/23/2018   Nausea and vomiting 05/23/2018   Per NM Hepato w Marjean Donna order    Past Surgical History:  Procedure Laterality Date   COLONOSCOPY  06/07/2018   Dr Alice Reichert   SHOULDER ARTHROSCOPY     x2   SHOULDER LATERJET     x3   TONSILLECTOMY  2005   UPPER GI ENDOSCOPY  06/07/2018   Dr Alice Reichert   WISDOM TOOTH EXTRACTION      There were no vitals filed for this visit.   Subjective Assessment - 09/05/21 1407     Subjective Pt states that she is doing well today. 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 She continues with posterior shoulder pain as well as rhomboid pain. No specific questions or concerns currently.    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 She has had 3 surgeries on the R shoulder and 4 on the L    Limitations Lifting;Standing;Writing;House hold activities    Patient Stated Goals Increase R shoulder ROM/ strength to improve pain-free mobility.    Pain  Onset --                TREATMENT     Ther-ex Nautilus: Seated lat pull down 40# 2 x 10; Seated rows 30# 2 x 10; Standing tricep extension 30# 2 x 10;  Supine R shoulder serratus punch with 3# dumbbell (DB) 2 x 10; Supine R shoulder flexion with 3# DB 2 x 10;   Manual L sidelying R posterior deltoid/infraspinatus/tricep/rhomboids/upper traps STM in left side-lying position;   Pt educated throughout session about proper posture and technique with exercises. Improved exercise technique, movement at target joints, use of target muscles after min to mod verbal, visual, tactile cues.   Pt. presents to PT with limited R shoulder AROM/ muscle weakness. Continued with strengthening today as well as manual techniques to decrease shoulder pain. Pt encouraged to continue her HEP. She will benefit from continued skilled PT services to increase R shoulder ROM/ strength to improve pain-free functional mobility.                        PT Long Term Goals - 08/24/21 0732       PT LONG TERM GOAL #1   Title Pt. I with HEP to increase R shoulder AROM to [facility] as compared  to L shoulder to improve pain-free mobility.    Baseline R sh. AROM after manual tx.: flexion 138 deg./ abduction 120 deg./ ER 27 deg./ IR 65 deg.   9/14: Seated R sh. AROM pre-tx.: flexion 127 deg./ abduction 121 deg./ ER 24 deg./ IR 69 deg. R shoulder PROM: flexion 148 deg. R shoulder AROM post-tx.: flexion 133 deg./ abduction 124 deg./ ER 27 deg./ IR 70 deg. 10/12: ROM ISQ from previous tx.    Time 4    Period Weeks    Status Partially Met    Target Date 09/09/21      PT LONG TERM GOAL #2   Title Pt. will increase R deltoid/ biceps/ triceps strength to grossly 4/5 MMT to improve return to household tasks/ running.    Baseline No R shoulder MMT testing per MD protocol 7/13: R shoulder Flex 4-/5 abd 4-/5 ER 4-/5 IR 4-/5. 8/3: L/R shoulder flexion: 4/4-.  Abduction: 4/3+.  IR/ER: 4/3+.  8/24: no  improvement in R sh. MMT noted as compared to last reassessment   9/14: R shoulder muscle weakness noted: flexion 4/5 MMT, abduction 4-/5 MMT (pain), biceps 4+/5 MMT, triceps 4/5 MMT  10/12: R shoulder muscle weakness noted: flexion 4/5 MMT, abduction 4-/5 MMT (pain), biceps 4+/5 MMT, triceps 4/5 MMT *ISQ from last assessment*.    Time 4    Period Weeks    Status Partially Met    Target Date 09/09/21      PT LONG TERM GOAL #3   Title Pt. will increase FOTO to 60 to improve R shoulder mobility.    Baseline FOTO: baseline 29 7/13: 56; 8/8: 60, 08/12/21: 57 10/12: 69 (achieved on the 40th visit)    Time 4    Period Weeks    Status Achieved    Target Date 08/24/21      PT LONG TERM GOAL #4   Title Pt. able to complete all work related tasks with no R shoulder limitations or pain to promote return to PLOF.    Baseline Pt. has not returned to work at this time due to R shoulder limitations. 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    Time 4    Period Weeks    Status Partially Met    Target Date 09/09/21                   Plan - 09/05/21 1524     Clinical Impression Statement Pt. presents to PT with limited R shoulder AROM/ muscle weakness. Continued with strengthening today as well as manual techniques to decrease shoulder pain. Pt encouraged to continue her HEP. She will benefit from continued skilled PT services to increase R shoulder ROM/ strength to improve pain-free functional mobility.    Personal Factors and Comorbidities Fitness    Stability/Clinical Decision Making Stable/Uncomplicated    Rehab Potential Good    PT Frequency 3x / week    PT Duration 4 weeks    PT Treatment/Interventions ADLs/Self Care Home Management;Aquatic Therapy;Cryotherapy;Electrical  Stimulation;Therapeutic exercise;Patient/family education;Neuromuscular re-education;Manual techniques;Scar mobilization;Passive range of motion;Functional mobility training    PT Next Visit Plan Focus on R shoulder overhead reaching/ strengthening ex.  Reassess R mid-thoracic/scapular pain    PT Home Exercise Plan See handouts    Consulted and Agree with Plan of Care Patient  Patient will benefit from skilled therapeutic intervention in order to improve the following deficits and impairments:  Pain, Decreased mobility, Decreased scar mobility, Hypomobility, Decreased strength, Decreased range of motion, Decreased endurance, Decreased activity tolerance, Impaired flexibility  Visit Diagnosis: Muscle weakness (generalized)  Shoulder joint stiffness, right     Problem List Patient Active Problem List   Diagnosis Date Noted   Generalized abdominal pain 06/19/2018   Nausea and vomiting 06/19/2018   Loss of weight 06/19/2018   Headache 12/18/2012   Hypothermia 12/18/2012   Phillips Grout PT, DPT, GCS  Meko Bellanger, PT 09/06/2021, 4:41 PM  [facility] [address] [place], [place], [address] Phone: [phone]   Fax:  [phone]  Name: Aurora Rody Ronda MRN: [id number] Date of Birth: [birth year]

## 2021-09-07 ENCOUNTER — Encounter: Payer: Self-pay | Admitting: Physical Therapy

## 2021-09-07 ENCOUNTER — Other Ambulatory Visit: Payer: Self-pay

## 2021-09-07 ENCOUNTER — Ambulatory Visit: Payer: 59 | Admitting: Physical Therapy

## 2021-09-07 DIAGNOSIS — M6281 Muscle weakness (generalized): Secondary | ICD-10-CM

## 2021-09-07 DIAGNOSIS — M25511 Pain in right shoulder: Secondary | ICD-10-CM

## 2021-09-07 DIAGNOSIS — M25612 Stiffness of left shoulder, not elsewhere classified: Secondary | ICD-10-CM

## 2021-09-07 DIAGNOSIS — M25512 Pain in left shoulder: Secondary | ICD-10-CM

## 2021-09-07 DIAGNOSIS — M25611 Stiffness of right shoulder, not elsewhere classified: Secondary | ICD-10-CM

## 2021-09-07 NOTE — Therapy (Signed)
[place] [facility] [address]. [place], [place], [address] Phone: [phone]   Fax:  [phone]  Physical Therapy Treatment  Patient Details  Name: Sara Gomez MRN: [id number] Date of Birth: 08/09/1996 Referring Provider (PT): Dr. Jeannie Fend   Encounter Date: 09/07/2021    PT End of Session - 09/07/21 0741     Visit Number 43    Number of Visits 19    Date for PT Re-Evaluation 09/09/21    Authorization Type BCBS COMM Pro (primary); UHC (secondary); visit limits based on necessity    Authorization - Visit Number 3    Authorization - Number of Visits 10    PT Start Time 0731    PT Stop Time 0816    PT Time Calculation (min) 45 min    Activity Tolerance Patient tolerated treatment well;Patient limited by pain    Behavior During Therapy [facility] for tasks assessed/performed              Past Medical History:  Diagnosis Date   Abdominal pain 05/23/2018   Nausea and vomiting 05/23/2018   Per NM Hepato w Marjean Donna order    Past Surgical History:  Procedure Laterality Date   COLONOSCOPY  06/07/2018   Dr Alice Reichert   SHOULDER ARTHROSCOPY     x2   SHOULDER LATERJET     x3   TONSILLECTOMY  2005   UPPER GI ENDOSCOPY  06/07/2018   Dr Alice Reichert   WISDOM TOOTH EXTRACTION      There were no vitals filed for this visit.    Pt states that her shoulder is doing well, and reports mild headache. Pt states that she has moderate neck pain and stiffness. Pt states that she took ibuprofen and gabapentin for better sleep adherence. PT reviewed gentle cervical stretches and use of MH or ice at home.       TREATMENT     Ther-ex  Nautilus: Standing lat pull down 40# 2 x 10; Scap. Retraction/ rows 30# 2 x 10; Standing tricep extension 30# 2 x 10; Standing bilat shoulder adduction 30# 2x10 Standing chest press with wand 30# 2x10    Supine R shoulder serratus punch with 3# dumbbell (DB) 2 x 10; Supine R shoulder flexion with 3#  DB 2 x 10;   Manual  Supine cervical traction (gentle static holds) 3x 30 sec. No improvement in pain  STM to L/R UT and cervical paraspinal muscles in supine/ prone  Grade II-III unilateral PA mobs to mid-upper thoracic vertebrae 2x20 sec. Each.  Prone trigger point release (manual) to R rhomboid region.    No TDN today.       Pt educated throughout session about proper posture and technique with exercises. Improved exercise technique, movement at target joints, use of target muscles after min to mod verbal, visual, tactile cues.  Limited by persistent upper cervical pain.     Pt. presents to PT with limited R shoulder AROM/ muscle weakness. Continued with strengthening today as well as manual techniques to decrease neck/ shoulder pain. Pt encouraged to continue her HEP.     PT Education - 09/08/21 1918     Education provided Yes    Education Details Gentle cervical stretches/ posture correction/ sleeping position.  Discussed cause of neck/ soft tissue pain.    Person(s) Educated Patient    Methods Explanation                 PT Long Term Goals - 08/24/21  0732       PT LONG TERM GOAL #1   Title Pt. I with HEP to increase R shoulder AROM to [facility] as compared to L shoulder to improve pain-free mobility.    Baseline R sh. AROM after manual tx.: flexion 138 deg./ abduction 120 deg./ ER 27 deg./ IR 65 deg.   9/14: Seated R sh. AROM pre-tx.: flexion 127 deg./ abduction 121 deg./ ER 24 deg./ IR 69 deg. R shoulder PROM: flexion 148 deg. R shoulder AROM post-tx.: flexion 133 deg./ abduction 124 deg./ ER 27 deg./ IR 70 deg. 10/12: ROM ISQ from previous tx.    Time 4    Period Weeks    Status Partially Met    Target Date 09/09/21      PT LONG TERM GOAL #2   Title Pt. will increase R deltoid/ biceps/ triceps strength to grossly 4/5 MMT to improve return to household tasks/ running.    Baseline No R shoulder MMT testing per MD protocol 7/13: R shoulder Flex 4-/5 abd 4-/5 ER 4-/5  IR 4-/5. 8/3: L/R shoulder flexion: 4/4-.  Abduction: 4/3+.  IR/ER: 4/3+.  8/24: no improvement in R sh. MMT noted as compared to last reassessment   9/14: R shoulder muscle weakness noted: flexion 4/5 MMT, abduction 4-/5 MMT (pain), biceps 4+/5 MMT, triceps 4/5 MMT  10/12: R shoulder muscle weakness noted: flexion 4/5 MMT, abduction 4-/5 MMT (pain), biceps 4+/5 MMT, triceps 4/5 MMT *ISQ from last assessment*.    Time 4    Period Weeks    Status Partially Met    Target Date 09/09/21      PT LONG TERM GOAL #3   Title Pt. will increase FOTO to 60 to improve R shoulder mobility.    Baseline FOTO: baseline 29 7/13: 56; 8/8: 60, 08/12/21: 57 10/12: 69 (achieved on the 40th visit)    Time 4    Period Weeks    Status Achieved    Target Date 08/24/21      PT LONG TERM GOAL #4   Title Pt. able to complete all work related tasks with no R shoulder limitations or pain to promote return to PLOF.    Baseline Pt. has not returned to work at this time due to R shoulder limitations. 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    Time 4    Period Weeks    Status Partially Met    Target Date 09/09/21              Tx. progression for R shoulder strengthening limited by persistent c/o cervical pain in standing and supine positions.  Neck pain remains persistent during all resisted ther.ex. and unable to get comfortable in supine and prone positions on mat table.  PT did not perform TDN today to determine if manual stretches/ STM benefits pts. cervical pain.  Pt. instructed in proper seated/ upright posture to manage pain with work-related and school tasks.  Limited tolerance with shoulder strengthening at Nautilus due to neck pain/ UE muscle fatigue.  Pt.has 1 more authorized PT tx. this Friday and MD f/u  scheduled for next week.  Pt. instructed on benefits of a more independent/ gym based ther.ex. program.   Focus on R shoulder overhead reaching/ strengthening ex.  Reassess R mid-thoracic/scapular pain.  Planning on discharge  to gym based ex. program     Patient will benefit from skilled therapeutic intervention in order to improve the following deficits and impairments:  Pain, Decreased mobility, Decreased scar mobility, Hypomobility, Decreased strength, Decreased range of motion, Decreased endurance, Decreased activity tolerance, Impaired flexibility  Visit Diagnosis: Muscle weakness (generalized)  Chronic left shoulder pain  Shoulder joint stiffness, right  Acute pain of right shoulder  Shoulder joint stiffness, left     Problem List Patient Active Problem List   Diagnosis Date Noted   Generalized abdominal pain 06/19/2018   Nausea and vomiting 06/19/2018   Loss of weight 06/19/2018   Headache 12/18/2012   Hypothermia 12/18/2012   Pura Spice, PT, DPT # [id number] Fara Olden, SPT 09/08/2021, 7:27 PM  [place] [address] [place], [place], [address] Phone: [phone]   Fax:  [phone]  Name: Sara Gomez MRN: [id number] Date of Birth: [birth year]

## 2021-09-09 ENCOUNTER — Other Ambulatory Visit: Payer: Self-pay

## 2021-09-09 ENCOUNTER — Ambulatory Visit: Payer: 59

## 2021-09-09 DIAGNOSIS — M6281 Muscle weakness (generalized): Secondary | ICD-10-CM | POA: Diagnosis not present

## 2021-09-09 DIAGNOSIS — M25611 Stiffness of right shoulder, not elsewhere classified: Secondary | ICD-10-CM

## 2021-09-09 NOTE — Patient Instructions (Signed)
HOME EXERCISE PROGRAM Created by Ria Comment, PT, DPT, [employer]  Oct 28th, 2022 View videos at [email or web address] Total 5   LAT PULL DOWN  ADJUST WEIGHT  SIT FACING IN. IT IS IMPORTANT TO MAINTAIN GOOD POSTURE AND KEEP YOUR CORE TIGHT.  PULL BAR DOWN TO YOUR CHEST, SQUEEZING YOUR SHOULDER BLADES TOGETHER. (WITHOUT ARCHING YOUR BACK)  SLOWLY RETURN TO STARTING POSITION.  Repeat 10 Times Hold 3 Seconds Complete 2 Sets Perform 3 Times a Week    [employer] machine to appropriate weight.  Grabbing handles, pull back squeezing your shoulder blades together at end range. Return to start position and repeat.  Machine style may vary depending on gym.  Repeat 10 Times Hold 3 Seconds Complete 2 Sets Perform 3 Times a Week    Machine Chest Press  Set Machine to appropriate weight.  Set seat so the handles are mid-chest. Push handles forward from chest until arms are fully extended. Slow return to start position.  Repeat 10 Times Hold 3 Seconds Complete 2 Sets Perform 3 Times a Week    Tricep cable extension ( ROPES ) - TRICEP EXT CABLE  Knees slightly bent, core engaged Elbows at the sides, shoulders back, chest up (STBU)  Start the movement by straightening the arms and squeezing the triceps. Slowly control your arms back up to just past 90 degrees.  Repeat until fatigue.  Repeat 10 Times Hold 3 Seconds Complete 2 Sets Perform 3 Times a Week    StarTrac Biceps  Adjust the handles to the "Biceps Curl" position and adjust seat to appropriate position. Adjust resistance as desired. Place you upper arms and elbows on the pad, straighten your elbows and grasp the handles on the machine with an underhand grip. In a controlled fashion bend your elbows toward your shoulder. Slowly release to beginning position.  Repeat 10 Times Hold 3 Seconds Complete 2 Sets Perform 3 Times a Week

## 2021-09-09 NOTE — Therapy (Signed)
[place] [facility] [address] [place], [place], [address] Phone: [phone]   Fax:  [phone]  Physical Therapy Treatment/Discharge   Patient Details  Name: Sara Gomez MRN: [id number] Date of Birth: Jan 08, 1996 Referring Provider (PT): Dr. Jeannie Fend   Encounter Date: 09/09/2021   PT End of Session - 09/09/21 0851     Visit Number 54    Number of Visits 35    Date for PT Re-Evaluation 09/09/21    Authorization Type BCBS COMM Pro (primary); UHC (secondary); visit limits based on necessity    Authorization - Visit Number 4    Authorization - Number of Visits 10    PT Start Time 0757    PT Stop Time [phone]    PT Time Calculation (min) 45 min    Activity Tolerance Patient tolerated treatment well;Patient limited by pain    Behavior During Therapy [facility] for tasks assessed/performed               Past Medical History:  Diagnosis Date   Abdominal pain 05/23/2018   Nausea and vomiting 05/23/2018   Per NM Hepato w Marjean Donna order    Past Surgical History:  Procedure Laterality Date   COLONOSCOPY  06/07/2018   Dr Alice Reichert   SHOULDER ARTHROSCOPY     x2   SHOULDER LATERJET     x3   TONSILLECTOMY  2005   UPPER GI ENDOSCOPY  06/07/2018   Dr Alice Reichert   WISDOM TOOTH EXTRACTION      There were no vitals filed for this visit.   Subjective Assessment - 09/09/21 0857     Subjective Pt reports that she is doing alright today. She continues with significant neck pain as well as periscapular muscle related pain. She is working at [employer] and continues with difficulty/limitation in lifting trays due to weakness in R shoulder.    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 She has had 3 surgeries on the R shoulder and 4 on the L    Limitations Lifting;Standing;Writing;House hold activities    Patient Stated Goals  Increase R shoulder ROM/ strength to improve pain-free mobility.    Currently in Pain? Yes    Pain Score 3     Pain Location Shoulder    Pain Orientation Right    Pain Descriptors / Indicators Aching;Sore    Pain Type Chronic pain    Pain Onset More than a month ago    Pain Frequency Intermittent                 TREATMENT     Ther-ex Updated outcome measures: FOTO: 60   Strength R/L 4/4 Shoulder flexion (anterior deltoid/pec major/coracobrachialis, axillary n. (C5-6) and musculocutaneous n. (C5-7)) 4/5 Shoulder abduction (deltoid/supraspinatus, axillary/suprascapular n, C5) 5/5 Shoulder external rotation (infraspinatus/teres minor) 4/4 Shoulder internal rotation (subcapularis/lats/pec major) 4+/4 Shoulder extension (posterior deltoid, lats, teres major, axillary/thoracodorsal n.) 4-/4 Shoulder horizontal abduction 4+/4+ Elbow flexion (biceps brachii, brachialis, brachioradialis, musculoskeletal n, C5-6) 4/4+ Elbow extension (triceps, radial n, C7) 5/5 Wrist Extension 5/5 Wrist Flexion 5/5 Finger adduction (interossei, ulnar n, T1)  AROM R Shoulder 136 Shoulder flexion 112 Shoulder abduction 40 Shoulder external rotation 40 Shoulder internal rotation *Indicates pain, overpressure performed unless otherwise indicated  Prone R shoulder extension with 2# dumbbell (DB) 2 x 10: Prone R shoulder low row with 2# DB 2 x 10; Prone R shoulder  horizontal abduction with 2# DB 2 x 10; Prone R shoulder manually resisted ER x 10; Prone R shoulder manually resisted IR x 10; Prone R shoulder tricep extension with 2# DB x 10;   Pt educated throughout session about proper posture and technique with exercises. Improved exercise technique, movement at target joints, use of target muscles after min to mod verbal, visual, tactile cues.   Pt. presents to PT with limited R shoulder AROM/ muscle weakness. Updated FOTO with patient which improved to 60 today. Right shoulder range of  motion and strength were updated today with the following results: R shoulder AROM: flexion 136, abduction 112, ER 40, IR: 40, R shoulder MMT: flexion 4/5, abduction 4/5, elbow flexion 4+/5, elbow extension 4/5. She continues lacking considerable range of motion and functional strength in her R shoulder. She has returned to part-time work in a [employer] but still encounters difficulty with carrying/lifting trays and other weighted objects at work. She remains noncompliant with her home program. Today is the last day of authorization and pt will be discharged from physical therapy. Pt hopes to join a local gym soon and at her request she was provided with a gym-based exercise routine for her R shoulder/arm. Pt advised to follow-up with surgeon and contact PT with any additional questions or concerns.                   PT Education - 09/09/21 0850     Education provided Yes    Education Details HEP, discharge    Person(s) Educated Patient    Methods Explanation    Comprehension Verbalized understanding                 PT Long Term Goals - 09/09/21 0851       PT LONG TERM GOAL #1   Title Pt. I with HEP to increase R shoulder AROM to [facility] as compared to L shoulder to improve pain-free mobility.    Baseline R sh. AROM after manual tx.: flexion 138 deg./ abduction 120 deg./ ER 27 deg./ IR 65 deg.   9/14: Seated R sh. AROM pre-tx.: flexion 127 deg./ abduction 121 deg./ ER 24 deg./ IR 69 deg. R shoulder PROM: flexion 148 deg. R shoulder AROM post-tx.: flexion 133 deg./ abduction 124 deg./ ER 27 deg./ IR 70 deg. 10/12: ROM ISQ from previous tx. 09/09/21: R shoulder AROM: flexion 136, abduction 112, ER 40, IR: 40    Time 4    Period Weeks    Status Partially Met      PT LONG TERM GOAL #2   Title Pt. will increase R deltoid/ biceps/ triceps strength to grossly 4/5 MMT to improve return to household tasks/ running.    Baseline No R shoulder MMT testing per MD protocol 7/13: R  shoulder Flex 4-/5 abd 4-/5 ER 4-/5 IR 4-/5. 8/3: L/R shoulder flexion: 4/4-.  Abduction: 4/3+.  IR/ER: 4/3+.  8/24: no improvement in R sh. MMT noted as compared to last reassessment   9/14: R shoulder muscle weakness noted: flexion 4/5 MMT, abduction 4-/5 MMT (pain), biceps 4+/5 MMT, triceps 4/5 MMT  10/12: R shoulder muscle weakness noted: flexion 4/5 MMT, abduction 4-/5 MMT (pain), biceps 4+/5 MMT, triceps 4/5 MMT *ISQ from last assessment*. 10/28: R shoulder muscle weakness noted: flexion 4/5, abduction 4/5, elbow flexion 4+/5, elbow extension 4/5;    Time 4    Period Weeks    Status Achieved      PT LONG TERM GOAL #  3   Title Pt. will increase FOTO to 60 to improve R shoulder mobility.    Baseline FOTO: baseline 29 7/13: 56; 8/8: 60, 08/12/21: 57 10/12: 69 (achieved on the 40th visit); 09/09/21: 60    Time 4    Period Weeks    Status Achieved      PT LONG TERM GOAL #4   Title Pt. able to complete all work related tasks with no R shoulder limitations or pain to promote return to PLOF.    Baseline Pt. has not returned to work at this time due to R shoulder limitations. 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 09/09/21: Pt has returned to working at a [employer] however she does have some difficulty/limitations with lifting;    Time 4    Period Weeks    Status Partially Met                   Plan - 09/09/21 0851     Clinical Impression Statement Pt. presents to PT with limited R shoulder AROM/ muscle weakness. Updated FOTO with patient which improved to 60 today. Right shoulder range of motion and strength were updated today with the following results: R shoulder AROM: flexion 136, abduction 112, ER 40, IR: 40, R shoulder MMT: flexion 4/5, abduction 4/5, elbow flexion 4+/5,  elbow extension 4/5. She continues lacking considerable range of motion and functional strength in her R shoulder. She has returned to part-time work in a [employer] but still encounters difficulty with carrying/lifting trays and other weighted objects at work. She remains noncompliant with her home program. Today is the last day of authorization and pt will be discharged from physical therapy. Pt hopes to join a local gym soon and at her request she was provided with a gym-based exercise routine for her R shoulder/arm. Pt advised to follow-up with surgeon and contact PT with any additional questions or concerns.    Personal Factors and Comorbidities Fitness    Stability/Clinical Decision Making Stable/Uncomplicated    Rehab Potential Good    PT Frequency 3x / week    PT Duration 4 weeks    PT Treatment/Interventions ADLs/Self Care Home Management;Aquatic Therapy;Cryotherapy;Electrical Stimulation;Therapeutic exercise;Patient/family education;Neuromuscular re-education;Manual techniques;Scar mobilization;Passive range of motion;Functional mobility training    PT Next Visit Plan Discharge    PT Home Exercise Plan See handouts    Consulted and Agree with Plan of Care Patient               Patient will benefit from skilled therapeutic intervention in order to improve the following deficits and impairments:  Pain, Decreased mobility, Decreased scar mobility, Hypomobility, Decreased strength, Decreased range of motion, Decreased endurance, Decreased activity tolerance, Impaired flexibility  Visit Diagnosis: Muscle weakness (generalized)  Shoulder joint stiffness, right     Problem List Patient Active Problem List   Diagnosis Date Noted   Generalized abdominal pain 06/19/2018   Nausea and vomiting 06/19/2018   Loss of weight 06/19/2018   Headache 12/18/2012   Hypothermia 12/18/2012   Phillips Grout PT, DPT, GCS  Baxter Gonzalez, PT 09/09/2021, 9:06 AM  [place] [facility] [address] [place], [place], [address] Phone: [phone]   Fax:  [phone]  Name: Sara Gomez MRN: [id number] Date of Birth: 11-11-1996

## 2021-09-12 ENCOUNTER — Encounter: Payer: 59 | Admitting: Physical Therapy
